# Patient Record
Sex: Female | Born: 1960
Health system: Southern US, Community
[De-identification: ages and names within clinical notes are randomized; demographics above are authoritative.]

## PROBLEM LIST (undated history)

## (undated) ENCOUNTER — Encounter

## (undated) ENCOUNTER — Encounter: Attending: Family | Primary: Family

## (undated) ENCOUNTER — Ambulatory Visit

## (undated) ENCOUNTER — Encounter: Attending: Gastroenterology | Primary: Gastroenterology

## (undated) ENCOUNTER — Telehealth: Attending: Gastroenterology | Primary: Gastroenterology

## (undated) ENCOUNTER — Telehealth
Attending: Pharmacist Clinician (PhC)/ Clinical Pharmacy Specialist | Primary: Pharmacist Clinician (PhC)/ Clinical Pharmacy Specialist

## (undated) ENCOUNTER — Ambulatory Visit: Payer: PRIVATE HEALTH INSURANCE

## (undated) ENCOUNTER — Encounter: Payer: PRIVATE HEALTH INSURANCE | Attending: Gastroenterology | Primary: Gastroenterology

## (undated) ENCOUNTER — Ambulatory Visit: Payer: PRIVATE HEALTH INSURANCE | Attending: Gastroenterology | Primary: Gastroenterology

## (undated) ENCOUNTER — Telehealth

## (undated) ENCOUNTER — Ambulatory Visit: Attending: Pharmacist | Primary: Pharmacist

## (undated) ENCOUNTER — Encounter
Attending: Pharmacist Clinician (PhC)/ Clinical Pharmacy Specialist | Primary: Pharmacist Clinician (PhC)/ Clinical Pharmacy Specialist

## (undated) ENCOUNTER — Non-Acute Institutional Stay: Payer: PRIVATE HEALTH INSURANCE

## (undated) DIAGNOSIS — E785 Hyperlipidemia, unspecified: Secondary | ICD-10-CM

## (undated) DIAGNOSIS — K759 Inflammatory liver disease, unspecified: Secondary | ICD-10-CM

## (undated) DIAGNOSIS — D249 Benign neoplasm of unspecified breast: Secondary | ICD-10-CM

## (undated) DIAGNOSIS — M1990 Unspecified osteoarthritis, unspecified site: Secondary | ICD-10-CM

## (undated) HISTORY — DX: Unspecified osteoarthritis, unspecified site: M19.90

## (undated) HISTORY — PX: ABDOMINAL HYSTERECTOMY: SHX81

## (undated) HISTORY — DX: Hyperlipidemia, unspecified: E78.5

## (undated) HISTORY — DX: Benign neoplasm of unspecified breast: D24.9

---
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
## 1995-07-15 HISTORY — PX: BREAST EXCISIONAL BIOPSY: SUR124

## 1995-07-15 HISTORY — PX: BREAST SURGERY: SHX581

## 2008-01-28 ENCOUNTER — Encounter: Payer: Self-pay | Admitting: Cardiovascular Disease

## 2009-03-05 ENCOUNTER — Ambulatory Visit: Payer: Self-pay | Admitting: Internal Medicine

## 2009-09-11 ENCOUNTER — Encounter: Payer: Self-pay | Admitting: Cardiovascular Disease

## 2009-09-11 LAB — CONVERTED CEMR LAB
ALT: 21 units/L
AST: 23 units/L
Alkaline Phosphatase: 65 units/L
CO2: 24 meq/L
Calcium: 9.7 mg/dL
Cholesterol: 219 mg/dL
GFR calc non Af Amer: >59 mL/min
Glucose, Bld: 95 mg/dL
HDL: 92 mg/dL
Sodium: 141 meq/L
Total Bilirubin: 0.5 mg/dL
Triglyceride fasting, serum: 79 mg/dL

## 2009-09-13 ENCOUNTER — Ambulatory Visit: Payer: Self-pay | Admitting: Cardiovascular Disease

## 2009-09-13 DIAGNOSIS — R079 Chest pain, unspecified: Secondary | ICD-10-CM | POA: Insufficient documentation

## 2009-09-13 DIAGNOSIS — E785 Hyperlipidemia, unspecified: Secondary | ICD-10-CM | POA: Insufficient documentation

## 2009-09-14 ENCOUNTER — Encounter: Payer: Self-pay | Admitting: Cardiovascular Disease

## 2010-05-15 ENCOUNTER — Ambulatory Visit: Payer: Self-pay | Admitting: Gastroenterology

## 2010-08-13 NOTE — Progress Notes (Signed)
Summary: PHI  PHI   Imported By: Harlon Flor 09/14/2009 14:33:12  _____________________________________________________________________  External Attachment:    Type:   Image     Comment:   External Document

## 2010-08-13 NOTE — Assessment & Plan Note (Signed)
Summary: NP6/AMD   Visit Type:  New Patient Referring Provider:  Mariah Milling Primary Provider:  Duncan Dull, MD  CC:  chest tightness, no sob, and no edema.  History of Present Illness: 50 year old woman with history of hyperlipidemia, strong family history of coronary artery disease in her father who had his first MI at age 58, with history of nonexertional chest discomfort. She was last seen by myself at [facility] on July 2009.  Previously in 2009, she had complained of some nonexertional chest discomfort. No workup was done at that time. She presents again with some pressure that comes on typically at night time although sometimes she has this in the daytime, associated with rest. She exercises on a regular basis, running up to 5 miles at a time with no significant symptoms of chest pressure or shortness of breath. Otherwise she is very active and feels well. She's not had any other associated symptoms such as lightheadedness, diaphoresis, near syncope, shortness of breath.  Preventive Screening-Counseling & Management  Alcohol-Tobacco     Alcohol drinks/day: 2     Smoking Status: never  Caffeine-Diet-Exercise     Caffeine use/day: 2 cups a day     Does Patient Exercise: yes  Current Problems (verified): 1)  Chest Pain  (ICD-786.50)  Current Medications (verified): 1)  None  Allergies (verified): No Known Drug Allergies  Past History:  Family History: Last updated: 09/13/2009 Mother: no health problems Father: died at 44: first MI at 36, 2nd at 12: and 2 heart transplants at 50&56: hyperlipidemia. Cousin had AMI at age 74.   Social History: Last updated: 09/13/2009 Full Time Married  Tobacco Use - No.  Alcohol Use - yes  Risk Factors: Alcohol Use: 2 (09/13/2009) Caffeine Use: 2 cups a day (09/13/2009) Exercise: yes (09/13/2009)  Risk Factors: Smoking Status: never (09/13/2009)  Past Medical History: Hyperlipidemia chest  pain  Family History: Reviewed history from 09/13/2009 and no changes required. Mother: no health problems Father: died at 24: first MI at 35, 2nd at 17: and 2 heart transplants at 50&56: hyperlipidemia. Cousin had AMI at age 71.   Social History: Reviewed history from 09/13/2009 and no changes required. Full Time Married  Tobacco Use - No.  Alcohol Use - yes Alcohol drinks/day:  2 Caffeine use/day:  2 cups a day Does Patient Exercise:  yes  Review of Systems       The patient complains of chest pain.  The patient denies fever, vision loss, decreased hearing, hoarseness, syncope, dyspnea on exertion, peripheral edema, prolonged cough, abdominal pain, incontinence, muscle weakness, depression, and enlarged lymph nodes.         Chest pressure  Vital Signs:  Patient profile:   50 year old female Height:      66.5 inches Weight:      148.50 pounds BMI:     23.69 Pulse rate:   72 / minute Pulse rhythm:   regular BP sitting:   108 / 72  (left arm) Cuff size:   regular  Vitals Entered By: Mercer Pod (September 13, 2009 3:17 PM)  Physical Exam  General:  young woman in no apparent distress, HEENT exam is benign, oropharynx is clear, neck is supple with no JVP or carotid bruits, heart sounds are regular with normal S1 and S2 and no murmurs appreciated, lungs are clear to auscultation with no wheezes Rales, abdominal exam is benign, she has no significant lower extremity edema, neurologic exam is grossly nonfocal, skin is  warm and dry.    EKG  Procedure date:  09/13/2009  Findings:      normal sinus rhythm at a rate of 72 beats per minute, no significant ST or T wave changes.  EKG  Procedure date:  09/13/2009  Findings:      normal sinus rhythm with rate of 72 beats per minute, no significant ST or T wave changes.  Impression & Recommendations:  Problem # 1:  CHEST PAIN (ICD-786.50) Chest pressure typically at nighttime, nonexertional. She does have a family  history of coronary artery disease though her symptoms are very atypical. She exercises without any significant symptoms. We did discuss with her that we could do a stress test at [facility] or at [facility] if she would like though this may be low yield given that she is not having anginal type symptoms. She will think about it and contact us if she would like to have further testing done.  Problem # 2:  HYPERLIPIDEMIA-MIXED (ICD-272.4) Cholesterol done in September 11, 2009 is 219, LDL 111, HDL 92. We talked to her at length about her lipids. She does have a strong family history of coronary artery disease. Both of her brothers are on statins. She exercises frequently and is very slim and in good shape and it is unlikely that she will be up to decrease her numbers to any significant degree. We have suggested that she could try a low dose statin such as Crestor 5 mg daily as this would hopefully improve her numbers by up to 30-40%. Dr. Darrick Huntsman or myself can  check her cholesterol in 3 months time.

## 2010-08-13 NOTE — Progress Notes (Signed)
Summary: [facility]   Imported By: Harlon Flor 09/14/2009 14:33:48  _____________________________________________________________________  External Attachment:    Type:   Image     Comment:   External Document

## 2010-09-10 ENCOUNTER — Ambulatory Visit: Payer: Self-pay | Admitting: Internal Medicine

## 2011-01-07 ENCOUNTER — Ambulatory Visit: Payer: Self-pay | Admitting: Unknown Physician Specialty

## 2011-02-04 ENCOUNTER — Encounter: Payer: Self-pay | Admitting: Cardiovascular Disease

## 2011-08-04 ENCOUNTER — Ambulatory Visit (INDEPENDENT_AMBULATORY_CARE_PROVIDER_SITE_OTHER): Payer: 59 | Admitting: Cardiovascular Disease

## 2011-08-04 ENCOUNTER — Encounter: Payer: Self-pay | Admitting: Cardiovascular Disease

## 2011-08-04 DIAGNOSIS — Z0000 Encounter for general adult medical examination without abnormal findings: Secondary | ICD-10-CM

## 2011-08-04 DIAGNOSIS — E785 Hyperlipidemia, unspecified: Secondary | ICD-10-CM

## 2011-08-04 DIAGNOSIS — R079 Chest pain, unspecified: Secondary | ICD-10-CM

## 2011-08-04 MED ORDER — ASPIRIN 81 MG PO CHEW: 81.0000 mg | CHEWABLE_TABLET | Freq: Every day | ORAL | Status: AC

## 2011-08-04 NOTE — Assessment & Plan Note (Signed)
We have asked her to work hard on her weight loss and diet with a recheck of her cholesterol in March. We may need to change her cholesterol medication to something stronger if her lab work does not show significant improvement from 2011.

## 2011-08-04 NOTE — Assessment & Plan Note (Signed)
Vague chest discomfort is likely noncardiac as it comes on at rest with no increase in symptoms with exertion. I've asked her to continue her workup on a daily basis and let us know if her symptoms get worse. If she does have worsening symptoms, we would have her complete a treadmill study.

## 2011-08-04 NOTE — Assessment & Plan Note (Signed)
We have encouraged continued exercise, careful diet management in an effort to lose weight. We have suggested she start aspirin 81 mg daily.

## 2011-08-04 NOTE — Progress Notes (Signed)
   Patient ID: Jessica Zuniga, female    DOB: 04/20/1961, 51 y.o.   MRN: [id number]  HPI Comments: 51 year old woman with history of hyperlipidemia, strong family history of coronary artery disease in her father who had his first MI at age 70, with Previous history of nonexertional chest discomfort who presents for routine followup.   She had chest discomfort previously in 2009. This was felt to be noncardiac. Symptoms went away and she has been doing well.  She reports having weight gain of 20 pounds and [place] and eating out frequently.  She has restarted her workout program 2 weeks ago and is walking for one hour at a time with her husband. She feels well with exertion with no chest tightness.  Occasionally at rest, she will have some chest discomfort that is mild. No other associated symptoms. She is scheduled to see Dr. Darrick Huntsman in March of 2013.   EKG shows normal sinus rhythm with rate 72 beats per minute with no significant ST or T wave changes    Outpatient Encounter Prescriptions as of 08/04/2011  Medication Sig Dispense Refill  . pravastatin (PRAVACHOL) 10 MG tablet Take 10 mg by mouth daily.         Review of Systems  Constitutional: Negative.   HENT: Negative.   Eyes: Negative.   Respiratory: Negative.   Cardiovascular: Negative.        Vague chest tightness at rest  Gastrointestinal: Negative.   Musculoskeletal: Negative.   Skin: Negative.   Neurological: Negative.   Hematological: Negative.   Psychiatric/Behavioral: Negative.   All other systems reviewed and are negative.    BP 144/72  Pulse 72  Ht 5\' 7"  (1.702 m)  Wt 161 lb 12.8 oz (73.392 kg)  BMI 25.34 kg/m2  Physical Exam  Nursing note and vitals reviewed. Constitutional: She is oriented to person, place, and time. She appears well-developed and well-nourished.  HENT:  Head: Normocephalic.  Nose: Nose normal.  Mouth/Throat: Oropharynx is clear and moist.    Eyes: Conjunctivae are normal. Pupils are equal, round, and reactive to light.  Neck: Normal range of motion. Neck supple. No JVD present.  Cardiovascular: Normal rate, regular rhythm, S1 normal, S2 normal, normal heart sounds and intact distal pulses.  Exam reveals no gallop and no friction rub.   No murmur heard. Pulmonary/Chest: Effort normal and breath sounds normal. No respiratory distress. She has no wheezes. She has no rales. She exhibits no tenderness.  Abdominal: Soft. Bowel sounds are normal. She exhibits no distension. There is no tenderness.  Musculoskeletal: Normal range of motion. She exhibits no edema and no tenderness.  Lymphadenopathy:    She has no cervical adenopathy.  Neurological: She is alert and oriented to person, place, and time. Coordination normal.  Skin: Skin is warm and dry. No rash noted. No erythema.  Psychiatric: She has a normal mood and affect. Her behavior is normal. Judgment and thought content normal.         Assessment and Plan

## 2011-08-04 NOTE — Patient Instructions (Signed)
You are doing well. No medication changes were made.  Please call us if you have new issues that need to be addressed before your next appt.  Your physician wants you to follow-up in: 12 months.  You will receive a reminder letter in the mail two months in advance. If you don't receive a letter, please call our office to schedule the follow-up appointment. 

## 2011-09-10 ENCOUNTER — Telehealth: Payer: Self-pay

## 2011-09-10 NOTE — Telephone Encounter (Signed)
She just needed a new refill sent in for the pravastatin.

## 2011-09-10 NOTE — Telephone Encounter (Signed)
Would like to know what Rx you were going to send in for her cholesterol? Please advise.

## 2012-08-23 ENCOUNTER — Telehealth: Payer: Self-pay | Admitting: Internal Medicine

## 2012-08-23 NOTE — Telephone Encounter (Signed)
Yes I cannot call in prescriptions for patients I have not seen yet , even if they are friends.

## 2012-08-23 NOTE — Telephone Encounter (Signed)
Pt got in touch with a Dr. At [facility] and they called her in some Antibiotics for UTI.Marland KitchenPlease confirm received? Disregard message below. I set her up in May to see Dr. Darrick Huntsman

## 2012-08-23 NOTE — Telephone Encounter (Signed)
Can you please schedule pt?  

## 2012-08-23 NOTE — Telephone Encounter (Signed)
Pt says she has visible blood in her urine. She has not established with Dr. Darrick Huntsman here at this office yet but says she is a friend of hers ??? I told her I would send a note back to see what we could do ??? She does use [facility] on [address].

## 2012-12-03 ENCOUNTER — Encounter: Payer: Self-pay | Admitting: Internal Medicine

## 2012-12-03 ENCOUNTER — Other Ambulatory Visit (HOSPITAL_COMMUNITY)
Admission: RE | Admit: 2012-12-03 | Discharge: 2012-12-03 | Disposition: A | Discharge status: Home / Self Care | Payer: 59 | Source: Ambulatory Visit | Attending: Internal Medicine | Admitting: Internal Medicine

## 2012-12-03 ENCOUNTER — Ambulatory Visit (INDEPENDENT_AMBULATORY_CARE_PROVIDER_SITE_OTHER): Payer: 59 | Admitting: Internal Medicine

## 2012-12-03 ENCOUNTER — Ambulatory Visit: Payer: Self-pay | Admitting: Internal Medicine

## 2012-12-03 VITALS — BP 136/78 | HR 77 | Temp 98.3°F | Resp 16 | Ht 66.0 in | Wt 155.8 lb

## 2012-12-03 DIAGNOSIS — Z0000 Encounter for general adult medical examination without abnormal findings: Secondary | ICD-10-CM

## 2012-12-03 DIAGNOSIS — Z124 Encounter for screening for malignant neoplasm of cervix: Secondary | ICD-10-CM

## 2012-12-03 DIAGNOSIS — Z79899 Other long term (current) drug therapy: Secondary | ICD-10-CM

## 2012-12-03 DIAGNOSIS — R920 Mammographic microcalcification found on diagnostic imaging of breast: Secondary | ICD-10-CM

## 2012-12-03 DIAGNOSIS — Z01419 Encounter for gynecological examination (general) (routine) without abnormal findings: Secondary | ICD-10-CM | POA: Insufficient documentation

## 2012-12-03 DIAGNOSIS — R5381 Other malaise: Secondary | ICD-10-CM

## 2012-12-03 DIAGNOSIS — Z1151 Encounter for screening for human papillomavirus (HPV): Secondary | ICD-10-CM | POA: Insufficient documentation

## 2012-12-03 DIAGNOSIS — E785 Hyperlipidemia, unspecified: Secondary | ICD-10-CM

## 2012-12-03 DIAGNOSIS — R5383 Other fatigue: Secondary | ICD-10-CM

## 2012-12-03 NOTE — Assessment & Plan Note (Signed)
She will need additional images of right breast due to presence of microcalcifications on right breast seen on today's screening mammogram done at [facility].

## 2012-12-03 NOTE — Progress Notes (Signed)
Patient ID: SUSA BONES, female   DOB: 09-27-1960, 52 y.o.   MRN: [id number]    Subjective:    Jessica Zuniga is a 52 y.o. female who presents for an annual exam. The patient has no complaints today. The patient is sexually active. GYN screening history: last pap: was normal and approximate date 2011 and was normal. The patient wears seatbelts: yes. The patient participates in regular exercise: yes. Has the patient ever been transfused or tattooed?: no. The patient reports that there is not domestic violence in her life.   Menstrual History: OB History   Grav Para Term Preterm Abortions TAB SAB Ect Mult Living                  Menarche age: 32  No LMP recorded. Patient is postmenopausal.    Past Medical History  Diagnosis Date  . HLD (hyperlipidemia)   . Chest pain    Family History  Problem Relation Age of Onset  . Heart attack Father     heart transplant  . Heart disease Paternal Grandmother   . Heart disease Maternal Grandmother     Current Outpatient Prescriptions on File Prior to Visit  Medication Sig Dispense Refill  . pravastatin (PRAVACHOL) 10 MG tablet Take 10 mg by mouth daily.       No current facility-administered medications on file prior to visit.   No Known Allergies  Review of Systems A comprehensive review of systems was negative.    Objective:    BP 136/78  Pulse 77  Temp(Src) 98.3 F (36.8 C) (Oral)  Resp 16  Ht 5\' 6"  (1.676 m)  Wt 155 lb 12 oz (70.648 kg)  BMI 25.15 kg/m2  SpO2 98%  General Appearance:    Alert, cooperative, no distress, appears stated age  Head:    Normocephalic, without obvious abnormality, atraumatic  Eyes:    PERRL, conjunctiva/corneas clear, EOM's intact, fundi    benign, both eyes  Ears:    Normal TM's and external ear canals, both ears  Nose:   Nares normal, septum midline, mucosa normal, no drainage    or sinus tenderness  Throat:   Lips, mucosa, and tongue normal; teeth and gums normal  Neck:   Supple,  symmetrical, trachea midline, no adenopathy;    thyroid:  no enlargement/tenderness/nodules; no carotid   bruit or JVD  Back:     Symmetric, no curvature, ROM normal, no CVA tenderness  Lungs:     Clear to auscultation bilaterally, respirations unlabored  Chest Wall:    No tenderness or deformity   Heart:    Regular rate and rhythm, S1 and S2 normal, no murmur, rub   or gallop  Breast Exam:    No tenderness, masses, or nipple abnormality  Abdomen:     Soft, non-tender, bowel sounds active all four quadrants,    no masses, no organomegaly  Genitalia:    Pelvic: cervix normal in appearance, external genitalia normal, no adnexal masses or tenderness, no cervical motion tenderness, rectovaginal septum normal, uterus normal size, shape, and consistency and vagina normal without discharge  Extremities:   Extremities normal, atraumatic, no cyanosis or edema  Pulses:   2+ and symmetric all extremities  Skin:   Skin color, texture, turgor normal, no rashes or lesions  Lymph nodes:   Cervical, supraclavicular, and axillary nodes normal  Neurologic:   CNII-XII intact, normal strength, sensation and reflexes    throughout    Assessment:   Encounter for preventive  health examination Annual comprehensive exam was done including breast, pelvic and PAP smear. All screenings have been addressed .   HYPERLIPIDEMIA-MIXED She has not had fasting lipids or CMET done in over a year.   The risks of conttinuinig statin therapy without surveillance of jheaptic function was dicussed.  Labs have been ordered throught [facility] for next week.   Abnormal mammogram with microcalcification She will need additional images of right breast due to presence of microcalcifications on right breast seen on today's screening mammogram done at [facility].    Updated Medication List Outpatient Encounter Prescriptions as of 12/03/2012  Medication Sig Dispense Refill  . pravastatin (PRAVACHOL) 10 MG tablet Take 10 mg by mouth  daily.       No facility-administered encounter medications on file as of 12/03/2012.

## 2012-12-03 NOTE — Assessment & Plan Note (Signed)
Annual comprehensive exam was done including breast, pelvic and PAP smear. All screenings have been addressed .  

## 2012-12-03 NOTE — Assessment & Plan Note (Signed)
She has not had fasting lipids or CMET done in over a year.   The risks of conttinuinig statin therapy without surveillance of jheaptic function was dicussed.  Labs have been ordered throught [facility] for next week.

## 2012-12-10 ENCOUNTER — Encounter: Payer: Self-pay | Admitting: *Deleted

## 2012-12-12 HISTORY — PX: BREAST BIOPSY: SHX20

## 2012-12-14 ENCOUNTER — Ambulatory Visit: Payer: Self-pay | Admitting: Internal Medicine

## 2012-12-15 ENCOUNTER — Telehealth: Payer: Self-pay | Admitting: Internal Medicine

## 2012-12-15 DIAGNOSIS — R920 Mammographic microcalcification found on diagnostic imaging of breast: Secondary | ICD-10-CM

## 2012-12-15 NOTE — Telephone Encounter (Signed)
Her additional mammogram views were reported as "probably normal." and short term gollow up of 6 months is recommended.  I would like to get a second opinion from a surgeon who does breast biopsies. Who would sle like me to call?

## 2012-12-15 NOTE — Telephone Encounter (Signed)
Patient notified as instructed and patient states if you would like second opinion she trustest your judgment and whom ever you recommend.

## 2012-12-15 NOTE — Telephone Encounter (Signed)
I trust Youth worker.  [facility]  . Will make referral

## 2012-12-23 ENCOUNTER — Encounter: Payer: Self-pay | Admitting: General Surgery

## 2012-12-23 ENCOUNTER — Ambulatory Visit (INDEPENDENT_AMBULATORY_CARE_PROVIDER_SITE_OTHER): Payer: 59 | Admitting: General Surgery

## 2012-12-23 VITALS — BP 120/68 | HR 72 | Resp 16 | Ht 66.0 in | Wt 159.0 lb

## 2012-12-23 DIAGNOSIS — R920 Mammographic microcalcification found on diagnostic imaging of breast: Secondary | ICD-10-CM

## 2012-12-23 NOTE — Progress Notes (Signed)
Patient ID: Jessica Zuniga, female   DOB: 1961/04/13, 52 y.o.   MRN: [id number]  Chief Complaint  Patient presents with  . Breast Problem    abnormal mammogram category 3    HPI Jessica Zuniga is a 52 y.o. female Who presents for a breast evaluation. The most recent mammogram was done on 12/03/12 cat 3. Patient does perform regular self breast checks and gets regular mammograms done. No family history of breast cancer. Patient had a right breast papilloma removed in 19-Dec-1995.  HPI  Past Medical History  Diagnosis Date  . HLD (hyperlipidemia)   . Chest pain   . Papilloma of breast     right breast    Past Surgical History  Procedure Laterality Date  . Breast surgery Right 1997    papilloma     Family History  Problem Relation Age of Onset  . Heart attack Father     heart transplant  . Heart disease Paternal Grandmother   . Heart disease Maternal Grandmother     Social History History  Substance Use Topics  . Smoking status: Never Smoker   . Smokeless tobacco: Never Used     Comment: tobacco use - no  . Alcohol Use: Yes    No Known Allergies  Current Outpatient Prescriptions  Medication Sig Dispense Refill  . pravastatin (PRAVACHOL) 10 MG tablet Take 10 mg by mouth daily.       No current facility-administered medications for this visit.    Review of Systems Review of Systems  Constitutional: Negative.   Respiratory: Negative.   Cardiovascular: Negative.     Blood pressure 120/68, pulse 72, resp. rate 16, height 5\' 6"  (1.676 m), weight 159 lb (72.122 kg).  Physical Exam Physical Exam  Constitutional: She appears well-developed and well-nourished.  Cardiovascular: Normal rate, regular rhythm and normal heart sounds.   Pulmonary/Chest: Effort normal and breath sounds normal. Right breast exhibits no inverted nipple, no mass, no nipple discharge, no skin change and no tenderness. Left breast exhibits no inverted nipple, no mass, no nipple discharge, no skin  change and no tenderness. Breasts are asymmetrical (left breast bigger than right).  Lymphadenopathy:    She has no cervical adenopathy.    She has no axillary adenopathy.  Neurological: She is alert.  Skin: Skin is warm and dry.    Data Reviewed Bilateral mammograms dated 12/03/2012 showed a cluster of microcalcifications in the inferior medial right breast without associated mass or architectural distortion. BI-RAD-0.  Focal spot compression views dated 12/14/2012 showed loose groupings of calcifications in the medial breast which were more scattered on the MLO and ML view. Some of the calcifications are unchanged from August 2010. BI-RAD-3.  On my review the films there's been a steady increasing calcifications especially between December 19, 2010 and Dec 18, 2012. Posterior triangular shape with some areas of amorphous, cluster calcifications are notable. Associated with this area or to long-standing macrocalcifications. There is always been increased density in this area dating back to these 18-Dec-2008. This is likely a degenerative changes, more pronounced from December 19, 2010. Assessment    Abnormal mammogram with increasing calcifications in the medial breast. No history of trauma.    Plan    The patient has been 1 who normally has mammograms on an every other year basis. I think a 6 month followup exam is likely add little benefit considering the significant change between Dec 19, 2010 and 2012-12-18. I suspect this is benign tissue, but I would recommend a stereotactic biopsy to confirm this.  The stereotactic procedure was reviewed as well as risks and benefits of the procedure.    Patient has been scheduled for a right breast stereotactic biopsy at [facility] for 12-27-12 at 12 noon. She will check-in at the [facility] at 11:30 am. This patient is aware of date, time, and instructions. Patient verbalizes understanding.    Jessica Zuniga 12/23/2012, 12:16 PM

## 2012-12-23 NOTE — Patient Instructions (Signed)
Patient has been scheduled for a right breast stereotactic biopsy at [facility] for 12-27-12 at 12 noon. She will check-in at the [facility] at 11:30 am. This patient is aware of date, time, and instructions. Patient verbalizes understanding.

## 2012-12-27 ENCOUNTER — Ambulatory Visit: Payer: Self-pay | Admitting: General Surgery

## 2012-12-27 DIAGNOSIS — R920 Mammographic microcalcification found on diagnostic imaging of breast: Secondary | ICD-10-CM

## 2012-12-28 ENCOUNTER — Telehealth: Payer: Self-pay | Admitting: General Surgery

## 2012-12-28 NOTE — Telephone Encounter (Signed)
The patient was notified that the biopsy results were benign. She reports that she is doing well. Will arrange for a follow up bilateral mammogram in six months.

## 2012-12-29 ENCOUNTER — Encounter: Payer: Self-pay | Admitting: General Surgery

## 2012-12-30 ENCOUNTER — Ambulatory Visit: Payer: 59

## 2013-01-25 ENCOUNTER — Encounter: Payer: Self-pay | Admitting: Internal Medicine

## 2013-01-26 ENCOUNTER — Encounter: Payer: Self-pay | Admitting: Internal Medicine

## 2013-06-29 ENCOUNTER — Encounter: Payer: Self-pay | Admitting: General Surgery

## 2013-06-29 ENCOUNTER — Ambulatory Visit: Payer: Self-pay | Admitting: General Surgery

## 2013-07-14 HISTORY — PX: KNEE ARTHROSCOPY WITH ANTERIOR CRUCIATE LIGAMENT (ACL) REPAIR: SHX5644

## 2013-07-19 ENCOUNTER — Ambulatory Visit: Payer: 59 | Admitting: General Surgery

## 2013-08-17 ENCOUNTER — Encounter: Payer: Self-pay | Admitting: *Deleted

## 2014-05-15 ENCOUNTER — Encounter: Payer: Self-pay | Admitting: General Surgery

## 2014-07-14 ENCOUNTER — Other Ambulatory Visit: Payer: Self-pay | Admitting: Internal Medicine

## 2014-07-14 DIAGNOSIS — Z1159 Encounter for screening for other viral diseases: Secondary | ICD-10-CM

## 2014-07-14 DIAGNOSIS — E785 Hyperlipidemia, unspecified: Secondary | ICD-10-CM

## 2014-07-14 DIAGNOSIS — IMO0002 Reserved for concepts with insufficient information to code with codable children: Secondary | ICD-10-CM

## 2014-07-14 DIAGNOSIS — R5383 Other fatigue: Secondary | ICD-10-CM

## 2015-03-30 ENCOUNTER — Telehealth: Payer: Self-pay | Admitting: Internal Medicine

## 2015-03-30 DIAGNOSIS — I10 Essential (primary) hypertension: Secondary | ICD-10-CM

## 2015-03-30 DIAGNOSIS — E785 Hyperlipidemia, unspecified: Secondary | ICD-10-CM

## 2015-03-30 DIAGNOSIS — Z1239 Encounter for other screening for malignant neoplasm of breast: Secondary | ICD-10-CM

## 2015-03-30 DIAGNOSIS — R5383 Other fatigue: Secondary | ICD-10-CM

## 2015-03-30 DIAGNOSIS — Z113 Encounter for screening for infections with a predominantly sexual mode of transmission: Secondary | ICD-10-CM

## 2015-03-30 NOTE — Telephone Encounter (Signed)
ordered

## 2015-03-30 NOTE — Telephone Encounter (Signed)
Pt would like to get fasting lab work and mammo referral. Need lab orders. Also pt just making sure that It's ok. Pt would like her husband to be a pt. Thank You!

## 2015-04-02 ENCOUNTER — Other Ambulatory Visit: Payer: Self-pay | Admitting: Internal Medicine

## 2015-04-02 ENCOUNTER — Ambulatory Visit (INDEPENDENT_AMBULATORY_CARE_PROVIDER_SITE_OTHER): Payer: 59 | Admitting: Internal Medicine

## 2015-04-02 ENCOUNTER — Ambulatory Visit
Admission: RE | Admit: 2015-04-02 | Discharge: 2015-04-02 | Disposition: A | Discharge status: Home / Self Care | Payer: 59 | Source: Ambulatory Visit | Attending: Internal Medicine | Admitting: Internal Medicine

## 2015-04-02 ENCOUNTER — Encounter: Payer: Self-pay | Admitting: Internal Medicine

## 2015-04-02 ENCOUNTER — Encounter (INDEPENDENT_AMBULATORY_CARE_PROVIDER_SITE_OTHER): Payer: Self-pay

## 2015-04-02 VITALS — BP 150/90 | HR 71 | Temp 98.6°F | Resp 14 | Ht 66.5 in | Wt 162.2 lb

## 2015-04-02 DIAGNOSIS — Z113 Encounter for screening for infections with a predominantly sexual mode of transmission: Secondary | ICD-10-CM | POA: Diagnosis not present

## 2015-04-02 DIAGNOSIS — Z0000 Encounter for general adult medical examination without abnormal findings: Secondary | ICD-10-CM

## 2015-04-02 DIAGNOSIS — E785 Hyperlipidemia, unspecified: Secondary | ICD-10-CM

## 2015-04-02 DIAGNOSIS — Z1239 Encounter for other screening for malignant neoplasm of breast: Secondary | ICD-10-CM

## 2015-04-02 DIAGNOSIS — R920 Mammographic microcalcification found on diagnostic imaging of breast: Secondary | ICD-10-CM

## 2015-04-02 DIAGNOSIS — Z1231 Encounter for screening mammogram for malignant neoplasm of breast: Secondary | ICD-10-CM | POA: Diagnosis not present

## 2015-04-02 MED ORDER — ZOLPIDEM TARTRATE 5 MG PO TABS: 5.0000 mg | ORAL_TABLET | Freq: Every evening | ORAL | Status: DC | PRN

## 2015-04-02 MED ORDER — LEVOFLOXACIN 500 MG PO TABS: 500.0000 mg | ORAL_TABLET | Freq: Every day | ORAL | Status: DC

## 2015-04-02 NOTE — Progress Notes (Signed)
Patient ID: Jessica Zuniga, female    DOB: 12-07-1960  Age: 54 y.o. MRN: [id number]  The patient is here for annual wellness examination and management of other chronic and acute problems.  Has lost 8 lbs intentionally since her last visit.   Had a Skiing accident last year  IN [place]  Had 9 monthsf rehab and 2 surgeries  at [facility]  By orthopedist  Dr Donna Christen.  Pain free now .  Has already skied again,  In April     The risk factors are reflected in the social history.  The roster of all physicians providing medical care to patient - is listed in the Snapshot section of the chart.  Activities of daily living:  The patient is 100% independent in all ADLs: dressing, toileting, feeding as well as independent mobility  Home safety : The patient has smoke detectors in the home. They wear seatbelts.  There are no firearms at home. There is no violence in the home.   There is no risks for hepatitis, STDs or HIV. There is no   history of blood transfusion. They have no travel history to infectious disease endemic areas of the world.  The patient has seen their dentist in the last six month. They have seen their eye doctor in the last year. They admit to slight hearing difficulty with regard to whispered voices and some television programs.  They have deferred audiologic testing in the last year.  They do not  have excessive sun exposure. Discussed the need for sun protection: hats, long sleeves and use of sunscreen if there is significant sun exposure.   Diet: the importance of a healthy diet is discussed. They do have a healthy diet.  The benefits of regular aerobic exercise were discussed. She walks 4 times per week ,  20 minutes.   Depression screen: there are no signs or vegative symptoms of depression- irritability, change in appetite, anhedonia, sadness/tearfullness.   The following portions of the patient's history were reviewed and updated as appropriate: allergies, current medications, past  family history, past medical history,  past surgical history, past social history  and problem list.  Visual acuity was not assessed per patient preference since she has regular follow up with her ophthalmologist. Hearing and body mass index were assessed and reviewed.   During the course of the visit the patient was educated and counseled about appropriate screening and preventive services including : fall prevention , diabetes screening, nutrition counseling, colorectal cancer screening, and recommended immunizations.    CC: The primary encounter diagnosis was Breast cancer screening. Diagnoses of Screen for STD (sexually transmitted disease), Encounter for preventive health examination, Hyperlipidemia LDL goal <130, and Abnormal mammogram with microcalcification were also pertinent to this visit.  History Kaydon has a past medical history of HLD (hyperlipidemia); Chest pain; and Papilloma of breast.   She has past surgical history that includes Breast surgery (Right, 1997); Breast excisional biopsy (Right, 1997); and Breast biopsy (Right, 12/2012).   Her family history includes Heart attack in her father; Heart disease in her maternal grandmother and paternal grandmother.She reports that she has never smoked. She has never used smokeless tobacco. She reports that she drinks alcohol. She reports that she does not use illicit drugs.  Outpatient Prescriptions Prior to Visit  Medication Sig Dispense Refill  . pravastatin (PRAVACHOL) 10 MG tablet Take 10 mg by mouth daily.     No facility-administered medications prior to visit.    Review of Systems  Patient denies headache, fevers, malaise, unintentional weight loss, skin rash, eye pain, sinus congestion and sinus pain, sore throat, dysphagia,  hemoptysis , cough, dyspnea, wheezing, chest pain, palpitations, orthopnea, edema, abdominal pain, nausea, melena, diarrhea, constipation, flank pain, dysuria, hematuria, urinary  Frequency, nocturia,  numbness, tingling, seizures,  Focal weakness, Loss of consciousness,  Tremor, insomnia, depression, anxiety, and suicidal ideation.     Objective:  BP 150/90 mmHg  Pulse 71  Temp(Src) 98.6 F (37 C) (Oral)  Resp 14  Ht 5' 6.5" (1.689 m)  Wt 162 lb 4 oz (73.596 kg)  BMI 25.80 kg/m2  SpO2 99%  Physical Exam   General appearance: alert, cooperative and appears stated age Head: Normocephalic, without obvious abnormality, atraumatic Eyes: conjunctivae/corneas clear. PERRL, EOM's intact. Fundi benign. Ears: normal TM's and external ear canals both ears Nose: Nares normal. Septum midline. Mucosa normal. No drainage or sinus tenderness. Throat: lips, mucosa, and tongue normal; teeth and gums normal Neck: no adenopathy, no carotid bruit, no JVD, supple, symmetrical, trachea midline and thyroid not enlarged, symmetric, no tenderness/mass/nodules Lungs: clear to auscultation bilaterally Breasts: normal appearance, no masses or tenderness Heart: regular rate and rhythm, S1, S2 normal, no murmur, click, rub or gallop Abdomen: soft, non-tender; bowel sounds normal; no masses,  no organomegaly Extremities: extremities normal, atraumatic, no cyanosis or edema Pulses: 2+ and symmetric Skin: Skin color, texture, turgor normal. No rashes or lesions Neurologic: Alert and oriented X 3, normal strength and tone. Normal symmetric reflexes. Normal coordination and gait.     Assessment & Plan:   Problem List Items Addressed This Visit      Unprioritized   Hyperlipidemia LDL goal <130    She is overdue for follow up labs.   Lab Results  Component Value Date   CHOL 219 09/11/2009   HDL 92 09/11/2009   LDLCALC 111 09/11/2009   TRIG 79 09/11/2009         Encounter for preventive health examination    Annual wellness  exam was done as well as a comprehensive physical exam  .  During the course of the visit the patient was educated and counseled about appropriate screening and preventive  services and screenings were brought up to date for cervical and breast cancer .  She will return for fasting labs to provide samples for diabetes screening and lipid analysis with projected  10 year  risk for CAD. nutrition counseling, skin cancer screening has been recommended, along with review of the age appropriate recommended immunizations.  Printed recommendations for health maintenance screenings was given.        Abnormal mammogram with microcalcification    She underwent biopsy in 2014 for benign lesion.  Repeat mammogram is normal 2016      Screen for STD (sexually transmitted disease)    Other Visit Diagnoses    Breast cancer screening    -  Primary       I have discontinued Ms. Petrucci's pravastatin. I am also having her start on zolpidem and levofloxacin.  Meds ordered this encounter  Medications  . zolpidem (AMBIEN) 5 MG tablet    Sig: Take 1 tablet (5 mg total) by mouth at bedtime as needed for sleep.    Dispense:  15 tablet    Refill:  1  . levofloxacin (LEVAQUIN) 500 MG tablet    Sig: Take 1 tablet (500 mg total) by mouth daily.    Dispense:  7 tablet    Refill:  0    Medications  Discontinued During This Encounter  Medication Reason  . pravastatin (PRAVACHOL) 10 MG tablet     Follow-up: Return for husband Leroy Sea can be set up as new patient .   Crecencio Mc, MD

## 2015-04-02 NOTE — Progress Notes (Signed)
Pre-visit discussion using our clinic review tool. No additional management support is needed unless otherwise documented below in the visit note.  

## 2015-04-02 NOTE — Patient Instructions (Addendum)
The  diet I discussed with you today is the 10 day Green Smoothie Cleansing /Detox Diet by Linden Dolin . The book is available on [place] and at [employer].  This is not a low carb or a weight loss diet,  It is fundamentally a "cleansing" low fat diet that eliminates sugar, gluten, caffeine, alcohol and dairy for 10 days .  What you add back after the initial ten days is entirely up to  you!  However, You can expect to lose 5 to 10 lbs depending on how strict you are.   I suggest drinking 2 smoothies/ daily and keeping one chewable meal (but keep it simple, like baked fish  With salad, rice or bok choy) .  You snack primarily on fresh  fruit, egg whites and judicious quantities of nuts. You can add vegetable based protein powder (nothing with whey , since whey is dairy) in it.  [employer] has a great selection, so does the [employer],  .   It does require some form of a nutrient extractor or juicing machine  (Vita Mix, a electric juicer,  Or a Nutribullet Rx).  i have found that using frozen fruits is much more convenient and cost effective. You can even find plenty of organic fruit in the frozen fruit section of [employer].  Just thaw what you need for the following day the night before in the refrigerator (to avoid jamming up your machine)   Health Maintenance Adopting a healthy lifestyle and getting preventive care can go a long way to promote health and wellness. Talk with your health care provider about what schedule of regular examinations is right for you. This is a good chance for you to check in with your provider about disease prevention and staying healthy. In between checkups, there are plenty of things you can do on your own. Experts have done a lot of research about which lifestyle changes and preventive measures are most likely to keep you healthy. Ask your health care provider for more information. WEIGHT AND DIET  Eat a healthy diet  Be sure to include plenty of vegetables, fruits, low-fat dairy  products, and lean protein.  Do not eat a lot of foods high in solid fats, added sugars, or salt.  Get regular exercise. This is one of the most important things you can do for your health.  Most adults should exercise for at least 150 minutes each week. The exercise should increase your heart rate and make you sweat (moderate-intensity exercise).  Most adults should also do strengthening exercises at least twice a week. This is in addition to the moderate-intensity exercise.  Maintain a healthy weight  Body mass index (BMI) is a measurement that can be used to identify possible weight problems. It estimates body fat based on height and weight. Your health care provider can help determine your BMI and help you achieve or maintain a healthy weight.  For females 58 years of age and older:   A BMI below 18.5 is considered underweight.  A BMI of 18.5 to 24.9 is normal.  A BMI of 25 to 29.9 is considered overweight.  A BMI of 30 and above is considered obese.  Watch levels of cholesterol and blood lipids  You should start having your blood tested for lipids and cholesterol at 56 years of age, then have this test every 5 years.  You may need to have your cholesterol levels checked more often if:  Your lipid or cholesterol levels are high.  You are older than 56 years of age.  You are at high risk for heart disease.  CANCER SCREENING   Lung Cancer  Lung cancer screening is recommended for adults 4-37 years old who are at high risk for lung cancer because of a history of smoking.  A yearly low-dose CT scan of the lungs is recommended for people who:  Currently smoke.  Have quit within the past 15 years.  Have at least a 30-pack-year history of smoking. A pack year is smoking an average of one pack of cigarettes a day for 1 year.  Yearly screening should continue until it has been 15 years since you quit.  Yearly screening should stop if you develop a health problem that  would prevent you from having lung cancer treatment.  Breast Cancer  Practice breast self-awareness. This means understanding how your breasts normally appear and feel.  It also means doing regular breast self-exams. Let your health care provider know about any changes, no matter how small.  If you are in your 20s or 30s, you should have a clinical breast exam (CBE) by a health care provider every 1-3 years as part of a regular health exam.  If you are 63 or older, have a CBE every year. Also consider having a breast X-ray (mammogram) every year.  If you have a family history of breast cancer, talk to your health care provider about genetic screening.  If you are at high risk for breast cancer, talk to your health care provider about having an MRI and a mammogram every year.  Breast cancer gene (BRCA) assessment is recommended for women who have family members with BRCA-related cancers. BRCA-related cancers include:  Breast.  Ovarian.  Tubal.  Peritoneal cancers.  Results of the assessment will determine the need for genetic counseling and BRCA1 and BRCA2 testing. Cervical Cancer Routine pelvic examinations to screen for cervical cancer are no longer recommended for nonpregnant women who are considered low risk for cancer of the pelvic organs (ovaries, uterus, and vagina) and who do not have symptoms. A pelvic examination may be necessary if you have symptoms including those associated with pelvic infections. Ask your health care provider if a screening pelvic exam is right for you.   The Pap test is the screening test for cervical cancer for women who are considered at risk.  If you had a hysterectomy for a problem that was not cancer or a condition that could lead to cancer, then you no longer need Pap tests.  If you are older than 65 years, and you have had normal Pap tests for the past 10 years, you no longer need to have Pap tests.  If you have had past treatment for cervical  cancer or a condition that could lead to cancer, you need Pap tests and screening for cancer for at least 20 years after your treatment.  If you no longer get a Pap test, assess your risk factors if they change (such as having a new sexual partner). This can affect whether you should start being screened again.  Some women have medical problems that increase their chance of getting cervical cancer. If this is the case for you, your health care provider may recommend more frequent screening and Pap tests.  The human papillomavirus (HPV) test is another test that may be used for cervical cancer screening. The HPV test looks for the virus that can cause cell changes in the cervix. The cells collected during the Pap test can  be tested for HPV.  The HPV test can be used to screen women 46 years of age and older. Getting tested for HPV can extend the interval between normal Pap tests from three to five years.  An HPV test also should be used to screen women of any age who have unclear Pap test results.  After 56 years of age, women should have HPV testing as often as Pap tests.  Colorectal Cancer  This type of cancer can be detected and often prevented.  Routine colorectal cancer screening usually begins at 56 years of age and continues through 56 years of age.  Your health care provider may recommend screening at an earlier age if you have risk factors for colon cancer.  Your health care provider may also recommend using home test kits to check for hidden blood in the stool.  A small camera at the end of a tube can be used to examine your colon directly (sigmoidoscopy or colonoscopy). This is done to check for the earliest forms of colorectal cancer.  Routine screening usually begins at age 59.  Direct examination of the colon should be repeated every 5-10 years through 56 years of age. However, you may need to be screened more often if early forms of precancerous polyps or small growths are  found. Skin Cancer  Check your skin from head to toe regularly.  Tell your health care provider about any new moles or changes in moles, especially if there is a change in a mole's shape or color.  Also tell your health care provider if you have a mole that is larger than the size of a pencil eraser.  Always use sunscreen. Apply sunscreen liberally and repeatedly throughout the day.  Protect yourself by wearing long sleeves, pants, a wide-brimmed hat, and sunglasses whenever you are outside. HEART DISEASE, DIABETES, AND HIGH BLOOD PRESSURE   Have your blood pressure checked at least every 1-2 years. High blood pressure causes heart disease and increases the risk of stroke.  If you are between 36 years and 49 years old, ask your health care provider if you should take aspirin to prevent strokes.  Have regular diabetes screenings. This involves taking a blood sample to check your fasting blood sugar level.  If you are at a normal weight and have a low risk for diabetes, have this test once every three years after 56 years of age.  If you are overweight and have a high risk for diabetes, consider being tested at a younger age or more often. PREVENTING INFECTION  Hepatitis B  If you have a higher risk for hepatitis B, you should be screened for this virus. You are considered at high risk for hepatitis B if:  You were born in a country where hepatitis B is common. Ask your health care provider which countries are considered high risk.  Your parents were born in a high-risk country, and you have not been immunized against hepatitis B (hepatitis B vaccine).  You have HIV or AIDS.  You use needles to inject street drugs.  You live with someone who has hepatitis B.  You have had sex with someone who has hepatitis B.  You get hemodialysis treatment.  You take certain medicines for conditions, including cancer, organ transplantation, and autoimmune conditions. Hepatitis C  Blood  testing is recommended for:  Everyone born from [birth year] through 1967.  Anyone with known risk factors for hepatitis C. Sexually transmitted infections (STIs)  You should be screened for sexually  transmitted infections (STIs) including gonorrhea and chlamydia if:  You are sexually active and are younger than 56 years of age.  You are older than 57 years of age and your health care provider tells you that you are at risk for this type of infection.  Your sexual activity has changed since you were last screened and you are at an increased risk for chlamydia or gonorrhea. Ask your health care provider if you are at risk.  If you do not have HIV, but are at risk, it may be recommended that you take a prescription medicine daily to prevent HIV infection. This is called pre-exposure prophylaxis (PrEP). You are considered at risk if:  You are sexually active and do not regularly use condoms or know the HIV status of your partner(s).  You take drugs by injection.  You are sexually active with a partner who has HIV. Talk with your health care provider about whether you are at high risk of being infected with HIV. If you choose to begin PrEP, you should first be tested for HIV. You should then be tested every 3 months for as long as you are taking PrEP.  PREGNANCY   If you are premenopausal and you may become pregnant, ask your health care provider about preconception counseling.  If you may become pregnant, take 400 to 800 micrograms (mcg) of folic acid every day.  If you want to prevent pregnancy, talk to your health care provider about birth control (contraception). OSTEOPOROSIS AND MENOPAUSE   Osteoporosis is a disease in which the bones lose minerals and strength with aging. This can result in serious bone fractures. Your risk for osteoporosis can be identified using a bone density scan.  If you are 74 years of age or older, or if you are at risk for osteoporosis and fractures, ask your  health care provider if you should be screened.  Ask your health care provider whether you should take a calcium or vitamin D supplement to lower your risk for osteoporosis.  Menopause may have certain physical symptoms and risks.  Hormone replacement therapy may reduce some of these symptoms and risks. Talk to your health care provider about whether hormone replacement therapy is right for you.  HOME CARE INSTRUCTIONS   Schedule regular health, dental, and eye exams.  Stay current with your immunizations.   Do not use any tobacco products including cigarettes, chewing tobacco, or electronic cigarettes.  If you are pregnant, do not drink alcohol.  If you are breastfeeding, limit how much and how often you drink alcohol.  Limit alcohol intake to no more than 1 drink per day for nonpregnant women. One drink equals 12 ounces of beer, 5 ounces of wine, or 1 ounces of hard liquor.  Do not use street drugs.  Do not share needles.  Ask your health care provider for help if you need support or information about quitting drugs.  Tell your health care provider if you often feel depressed.  Tell your health care provider if you have ever been abused or do not feel safe at home. Document Released: 01/13/2011 Document Revised: 11/14/2013 Document Reviewed: 06/01/2013 [facility] Patient Information 2015 [place], [place]. This information is not intended to replace advice given to you by your health care provider. Make sure you discuss any questions you have with your health care provider.

## 2015-04-04 ENCOUNTER — Other Ambulatory Visit: Payer: Self-pay | Admitting: Internal Medicine

## 2015-04-04 DIAGNOSIS — R7401 Elevation of levels of liver transaminase levels: Secondary | ICD-10-CM

## 2015-04-04 DIAGNOSIS — E559 Vitamin D deficiency, unspecified: Secondary | ICD-10-CM

## 2015-04-04 DIAGNOSIS — R740 Nonspecific elevation of levels of transaminase and lactic acid dehydrogenase [LDH]: Secondary | ICD-10-CM

## 2015-04-04 NOTE — Assessment & Plan Note (Signed)
She underwent biopsy in 2014 for benign lesion.  Repeat mammogram is normal 2016

## 2015-04-04 NOTE — Assessment & Plan Note (Addendum)
She is overdue for follow up labs.   Lab Results  Component Value Date   CHOL 219 09/11/2009   HDL 92 09/11/2009   LDLCALC 111 09/11/2009   TRIG 79 09/11/2009

## 2015-04-04 NOTE — Assessment & Plan Note (Signed)

## 2015-04-05 LAB — COMPREHENSIVE METABOLIC PANEL
A/G RATIO: 1.8 (ref 1.1–2.5)
ALBUMIN: 4.6 g/dL (ref 3.5–5.5)
ALK PHOS: 62 IU/L (ref 39–117)
ALT: 43 IU/L — ABNORMAL HIGH (ref 0–32)
AST: 33 IU/L (ref 0–40)
BILIRUBIN TOTAL: 0.5 mg/dL (ref 0.0–1.2)
BUN / CREAT RATIO: 16 (ref 9–23)
BUN: 13 mg/dL (ref 6–24)
CHLORIDE: 98 mmol/L (ref 97–108)
CO2: 23 mmol/L (ref 18–29)
Calcium: 9.9 mg/dL (ref 8.7–10.2)
Creatinine, Ser: 0.79 mg/dL (ref 0.57–1.00)
GFR calc Af Amer: 97 mL/min/{1.73_m2} (ref >59)
GFR calc non Af Amer: 84 mL/min/{1.73_m2} (ref >59)
GLUCOSE: 102 mg/dL — AB (ref 65–99)
Globulin, Total: 2.6 g/dL (ref 1.5–4.5)
POTASSIUM: 4.3 mmol/L (ref 3.5–5.2)
Sodium: 139 mmol/L (ref 134–144)
Total Protein: 7.2 g/dL (ref 6.0–8.5)

## 2015-04-05 LAB — CBC WITH DIFFERENTIAL/PLATELET
BASOS ABS: 0 10*3/uL (ref 0.0–0.2)
Basos: 0 %
EOS (ABSOLUTE): 0.2 10*3/uL (ref 0.0–0.4)
Eos: 4 %
HEMOGLOBIN: 13.5 g/dL (ref 11.1–15.9)
Hematocrit: 39.2 % (ref 34.0–46.6)
Immature Grans (Abs): 0 10*3/uL (ref 0.0–0.1)
Immature Granulocytes: 0 %
LYMPHS ABS: 1.9 10*3/uL (ref 0.7–3.1)
Lymphs: 38 %
MCH: 32.3 pg (ref 26.6–33.0)
MCHC: 34.4 g/dL (ref 31.5–35.7)
MCV: 94 fL (ref 79–97)
Monocytes Absolute: 0.3 10*3/uL (ref 0.1–0.9)
Monocytes: 7 %
NEUTROS ABS: 2.6 10*3/uL (ref 1.4–7.0)
Neutrophils: 51 %
PLATELETS: 220 10*3/uL (ref 150–379)
RBC: 4.18 x10E6/uL (ref 3.77–5.28)
RDW: 12.9 % (ref 12.3–15.4)
WBC: 5 10*3/uL (ref 3.4–10.8)

## 2015-04-05 LAB — TSH: TSH: 1.47 u[IU]/mL (ref 0.450–4.500)

## 2015-04-05 LAB — LIPID PANEL
CHOLESTEROL TOTAL: 250 mg/dL — AB (ref 100–199)
Chol/HDL Ratio: 2.7 ratio units (ref 0.0–4.4)
HDL: 92 mg/dL (ref >39)
LDL Calculated: 137 mg/dL — ABNORMAL HIGH (ref 0–99)
Triglycerides: 103 mg/dL (ref 0–149)
VLDL CHOLESTEROL CAL: 21 mg/dL (ref 5–40)

## 2015-04-05 LAB — HIV ANTIBODY (ROUTINE TESTING W REFLEX): HIV Screen 4th Generation wRfx: NONREACTIVE

## 2015-04-05 LAB — HCV COMMENT:

## 2015-04-05 LAB — VITAMIN D 25 HYDROXY (VIT D DEFICIENCY, FRACTURES): VIT D 25 HYDROXY: 24.2 ng/mL — AB (ref 30.0–100.0)

## 2015-04-05 LAB — HEPATITIS C ANTIBODY (REFLEX)

## 2015-04-08 DIAGNOSIS — R7401 Elevation of levels of liver transaminase levels: Secondary | ICD-10-CM | POA: Insufficient documentation

## 2015-04-08 DIAGNOSIS — R740 Nonspecific elevation of levels of transaminase and lactic acid dehydrogenase [LDH]: Secondary | ICD-10-CM

## 2015-04-08 DIAGNOSIS — E559 Vitamin D deficiency, unspecified: Secondary | ICD-10-CM | POA: Insufficient documentation

## 2015-04-08 MED ORDER — ERGOCALCIFEROL 1.25 MG (50000 UT) PO CAPS: 50000.0000 [IU] | ORAL_CAPSULE | ORAL | Status: DC

## 2015-04-08 NOTE — Addendum Note (Signed)
Addended by: Crecencio Mc on: 04/08/2015 02:58 PM   Modules accepted: Orders

## 2015-04-09 ENCOUNTER — Telehealth: Payer: Self-pay | Admitting: Internal Medicine

## 2015-04-09 ENCOUNTER — Encounter: Payer: Self-pay | Admitting: *Deleted

## 2015-04-09 NOTE — Telephone Encounter (Signed)
PATIENT NOTIFIED.

## 2015-04-09 NOTE — Telephone Encounter (Signed)
Mammogram ok to tell patient normal in system.

## 2015-04-09 NOTE — Telephone Encounter (Signed)
Yes ok to tell her

## 2015-05-04 ENCOUNTER — Other Ambulatory Visit: Payer: Self-pay | Admitting: Internal Medicine

## 2016-02-20 DIAGNOSIS — H5213 Myopia, bilateral: Secondary | ICD-10-CM | POA: Diagnosis not present

## 2016-05-21 ENCOUNTER — Other Ambulatory Visit: Payer: Self-pay | Admitting: Internal Medicine

## 2016-05-21 DIAGNOSIS — Z1231 Encounter for screening mammogram for malignant neoplasm of breast: Secondary | ICD-10-CM

## 2016-06-18 ENCOUNTER — Ambulatory Visit
Admission: RE | Admit: 2016-06-18 | Discharge: 2016-06-18 | Disposition: A | Discharge status: Home / Self Care | Payer: BLUE CROSS/BLUE SHIELD | Source: Ambulatory Visit | Attending: Internal Medicine | Admitting: Internal Medicine

## 2016-06-18 DIAGNOSIS — Z1231 Encounter for screening mammogram for malignant neoplasm of breast: Secondary | ICD-10-CM

## 2016-07-02 ENCOUNTER — Other Ambulatory Visit: Payer: Self-pay | Admitting: Family

## 2016-07-02 ENCOUNTER — Ambulatory Visit (INDEPENDENT_AMBULATORY_CARE_PROVIDER_SITE_OTHER): Payer: BLUE CROSS/BLUE SHIELD

## 2016-07-02 ENCOUNTER — Telehealth: Payer: Self-pay | Admitting: Family

## 2016-07-02 ENCOUNTER — Ambulatory Visit (INDEPENDENT_AMBULATORY_CARE_PROVIDER_SITE_OTHER): Payer: BLUE CROSS/BLUE SHIELD | Admitting: Family

## 2016-07-02 ENCOUNTER — Encounter: Payer: Self-pay | Admitting: Family

## 2016-07-02 VITALS — BP 136/70 | HR 81 | Temp 98.0°F | Ht 66.5 in | Wt 169.6 lb

## 2016-07-02 DIAGNOSIS — R6883 Chills (without fever): Secondary | ICD-10-CM

## 2016-07-02 DIAGNOSIS — R748 Abnormal levels of other serum enzymes: Secondary | ICD-10-CM

## 2016-07-02 DIAGNOSIS — R8290 Unspecified abnormal findings in urine: Secondary | ICD-10-CM

## 2016-07-02 DIAGNOSIS — R509 Fever, unspecified: Secondary | ICD-10-CM | POA: Diagnosis not present

## 2016-07-02 LAB — COMPREHENSIVE METABOLIC PANEL
ALK PHOS: 68 U/L (ref 39–117)
ALT: 768 U/L — AB (ref 0–35)
AST: 532 U/L — ABNORMAL HIGH (ref 0–37)
Albumin: 4.2 g/dL (ref 3.5–5.2)
BILIRUBIN TOTAL: 0.7 mg/dL (ref 0.2–1.2)
BUN: 15 mg/dL (ref 6–23)
CO2: 27 mEq/L (ref 19–32)
CREATININE: 0.74 mg/dL (ref 0.40–1.20)
Calcium: 9.3 mg/dL (ref 8.4–10.5)
Chloride: 104 mEq/L (ref 96–112)
GFR: 85.8 mL/min (ref >60.00)
GLUCOSE: 110 mg/dL — AB (ref 70–99)
Potassium: 4.5 mEq/L (ref 3.5–5.1)
Sodium: 140 mEq/L (ref 135–145)
TOTAL PROTEIN: 6.9 g/dL (ref 6.0–8.3)

## 2016-07-02 LAB — POCT INFLUENZA A/B
INFLUENZA B, POC: NEGATIVE
Influenza A, POC: NEGATIVE

## 2016-07-02 LAB — CBC
HCT: 40.9 % (ref 36.0–46.0)
Hemoglobin: 13.9 g/dL (ref 12.0–15.0)
MCHC: 34 g/dL (ref 30.0–36.0)
MCV: 95.1 fl (ref 78.0–100.0)
Platelets: 161 10*3/uL (ref 150.0–400.0)
RBC: 4.3 Mil/uL (ref 3.87–5.11)
RDW: 13.4 % (ref 11.5–15.5)
WBC: 5.2 10*3/uL (ref 4.0–10.5)

## 2016-07-02 LAB — POCT URINALYSIS DIPSTICK
Glucose, UA: NEGATIVE
KETONES UA: NEGATIVE
Leukocytes, UA: NEGATIVE
Nitrite, UA: NEGATIVE
PH UA: 5
Protein, UA: NEGATIVE
RBC UA: NEGATIVE
Spec Grav, UA: >=1.03
Urobilinogen, UA: 0.2

## 2016-07-02 LAB — TSH: TSH: 1.18 u[IU]/mL (ref 0.35–4.50)

## 2016-07-02 NOTE — Addendum Note (Signed)
Addended by: Leeanne Rio on: 07/02/2016 11:49 AM   Modules accepted: Orders

## 2016-07-02 NOTE — Progress Notes (Unsigned)
   Subjective:    Patient ID: Jessica Zuniga, female    DOB: 21-Apr-1961, 55 y.o.   MRN: [id number]  CC: Jessica Zuniga is a 55 y.o. female who presents today for an acute visit.    HPI: HPI  HISTORY:  Past Medical History:  Diagnosis Date  . Chest pain   . HLD (hyperlipidemia)   . Papilloma of breast    right breast   Past Surgical History:  Procedure Laterality Date  . BREAST BIOPSY Right 12/2012   neg stereo  . BREAST EXCISIONAL BIOPSY Right 1997   neg  . BREAST SURGERY Right 1997   papilloma    Family History  Problem Relation Age of Onset  . Heart attack Father     heart transplant  . Heart disease Paternal Grandmother   . Heart disease Maternal Grandmother   . Breast cancer Neg Hx     Allergies: Patient has no known allergies. No current outpatient prescriptions on file prior to visit.   No current facility-administered medications on file prior to visit.     Social History  Substance Use Topics  . Smoking status: Never Smoker  . Smokeless tobacco: Never Used     Comment: tobacco use - no  . Alcohol use Yes    Review of Systems    Objective:    There were no vitals taken for this visit.   Physical Exam     Assessment & Plan:      Ms. Deras does not currently have medications on file.   No orders of the defined types were placed in this encounter.   Return precautions given.   Risks, benefits, and alternatives of the medications and treatment plan prescribed today were discussed, and patient expressed understanding.   Education regarding symptom management and diagnosis given to patient on AVS.  Continue to follow with TULLO, Aris Everts, MD for routine health maintenance.   Trenda Moots and I agreed with plan.   Mable Paris, FNP

## 2016-07-02 NOTE — Progress Notes (Signed)
Pre visit review using our clinic review tool, if applicable. No additional management support is needed unless otherwise documented below in the visit note. 

## 2016-07-02 NOTE — Telephone Encounter (Signed)
Spoke with pt about labs Gi referral, RUQ [place], repeat labs in 3 days  600mg  2-3 time per day Alcohol 2-3 glasses wine per night  Advised to hold NSAIDs, alcohol. If fever, N, V, go to ed

## 2016-07-02 NOTE — Progress Notes (Signed)
Subjective:    Patient ID: Jessica Zuniga, female    DOB: 12-28-1960, 55 y.o.   MRN: [id number]  CC: TEAUNA Jessica Zuniga is a 55 y.o. female who presents today for an acute visit.    HPI: CC: slight cough x one month. No real sinus congestion. Endorses low grade fever 99-100, chills, bodyaches in the early morning. Ibuprofen with some relief. Overall just doesn't feel well.         HISTORY:  Past Medical History:  Diagnosis Date  . Chest pain   . HLD (hyperlipidemia)   . Papilloma of breast    right breast   Past Surgical History:  Procedure Laterality Date  . BREAST BIOPSY Right 12/2012   neg stereo  . BREAST EXCISIONAL BIOPSY Right 1997   neg  . BREAST SURGERY Right 1997   papilloma    Family History  Problem Relation Age of Onset  . Heart attack Father     heart transplant  . Heart disease Paternal Grandmother   . Heart disease Maternal Grandmother   . Breast cancer Neg Hx     Allergies: Patient has no known allergies. No current outpatient prescriptions on file prior to visit.   No current facility-administered medications on file prior to visit.     Social History  Substance Use Topics  . Smoking status: Never Smoker  . Smokeless tobacco: Never Used     Comment: tobacco use - no  . Alcohol use Yes    Review of Systems  Constitutional: Positive for chills and fever (low grade).  HENT: Positive for congestion. Negative for sinus pressure.   Respiratory: Negative for cough and choking.   Cardiovascular: Negative for chest pain and palpitations.  Gastrointestinal: Negative for abdominal distention, abdominal pain, constipation, nausea and vomiting.  Genitourinary: Negative for dysuria, flank pain and frequency.      Objective:    BP 136/70   Pulse 81   Temp 98 F (36.7 C) (Oral)   Ht 5' 6.5" (1.689 m)   Wt 169 lb 9.6 oz (76.9 kg)   SpO2 97%   BMI 26.96 kg/m    Physical Exam  Constitutional: She appears well-developed and well-nourished.  HENT:    Head: Normocephalic and atraumatic.  Right Ear: Hearing, tympanic membrane, external ear and ear canal normal. No drainage, swelling or tenderness. No foreign bodies. Tympanic membrane is not erythematous and not bulging. No middle ear effusion. No decreased hearing is noted.  Left Ear: Hearing, tympanic membrane, external ear and ear canal normal. No drainage, swelling or tenderness. No foreign bodies. Tympanic membrane is not erythematous and not bulging.  No middle ear effusion. No decreased hearing is noted.  Nose: Nose normal. No rhinorrhea. Right sinus exhibits no maxillary sinus tenderness and no frontal sinus tenderness. Left sinus exhibits no maxillary sinus tenderness and no frontal sinus tenderness.  Mouth/Throat: Uvula is midline, oropharynx is clear and moist and mucous membranes are normal. No oropharyngeal exudate, posterior oropharyngeal edema, posterior oropharyngeal erythema or tonsillar abscesses.  Eyes: Conjunctivae are normal.  Cardiovascular: Normal rate, regular rhythm, normal heart sounds and normal pulses.   Pulmonary/Chest: Effort normal and breath sounds normal. She has no wheezes. She has no rhonchi. She has no rales.  Abdominal: There is no CVA tenderness.  Lymphadenopathy:       Head (right side): No submental, no submandibular, no tonsillar, no preauricular, no posterior auricular and no occipital adenopathy present.       Head (left side): No  submental, no submandibular, no tonsillar, no preauricular, no posterior auricular and no occipital adenopathy present.    She has no cervical adenopathy.  Neurological: She is alert.  Skin: Skin is warm and dry.  Psychiatric: She has a normal mood and affect. Her speech is normal and behavior is normal. Thought content normal.  Vitals reviewed.      Assessment & Plan:  1. Chills Unclear etiology of chills, general malaise at this time. Physical exam reassuring. Negative flu. Pending CXR, lab work. Considering viral  etiology.  - CBC - Comprehensive metabolic panel - POCT Urinalysis Dipstick - DG Chest 2 View; Future - POCT Influenza A/B  2. Urine findings abnormal UA doesn't indication infection. Pending culture to ensure.  - Urine Culture     I have discontinued Ms. Duell's zolpidem and levofloxacin.   No orders of the defined types were placed in this encounter.   Return precautions given.   Risks, benefits, and alternatives of the medications and treatment plan prescribed today were discussed, and patient expressed understanding.   Education regarding symptom management and diagnosis given to patient on AVS.  Continue to follow with TULLO, Aris Everts, MD for routine health maintenance.   Trenda Moots and I agreed with plan.   Mable Paris, FNP

## 2016-07-03 ENCOUNTER — Ambulatory Visit
Admission: RE | Admit: 2016-07-03 | Discharge: 2016-07-03 | Disposition: A | Discharge status: Home / Self Care | Payer: BLUE CROSS/BLUE SHIELD | Source: Ambulatory Visit | Attending: Family | Admitting: Family

## 2016-07-03 ENCOUNTER — Telehealth: Payer: Self-pay | Admitting: Family

## 2016-07-03 ENCOUNTER — Other Ambulatory Visit: Payer: Self-pay | Admitting: Family

## 2016-07-03 DIAGNOSIS — R748 Abnormal levels of other serum enzymes: Secondary | ICD-10-CM

## 2016-07-03 DIAGNOSIS — K760 Fatty (change of) liver, not elsewhere classified: Secondary | ICD-10-CM | POA: Diagnosis not present

## 2016-07-03 DIAGNOSIS — R945 Abnormal results of liver function studies: Secondary | ICD-10-CM | POA: Diagnosis not present

## 2016-07-03 LAB — URINE CULTURE: Organism ID, Bacteria: NO GROWTH

## 2016-07-03 NOTE — Telephone Encounter (Signed)
Appt with GI 07/15/16 Will come in tomorrow for labs Feeling well today; no F, N, V Discussed TSH, RUQ [place].

## 2016-07-04 ENCOUNTER — Other Ambulatory Visit (INDEPENDENT_AMBULATORY_CARE_PROVIDER_SITE_OTHER): Payer: BLUE CROSS/BLUE SHIELD

## 2016-07-04 ENCOUNTER — Telehealth: Payer: Self-pay | Admitting: Family

## 2016-07-04 DIAGNOSIS — R748 Abnormal levels of other serum enzymes: Secondary | ICD-10-CM

## 2016-07-04 LAB — COMPREHENSIVE METABOLIC PANEL
ALBUMIN: 4.6 g/dL (ref 3.5–5.2)
ALK PHOS: 70 U/L (ref 39–117)
ALT: 750 U/L — AB (ref 0–35)
AST: 486 U/L — AB (ref 0–37)
BILIRUBIN TOTAL: 0.9 mg/dL (ref 0.2–1.2)
BUN: 13 mg/dL (ref 6–23)
CALCIUM: 9.6 mg/dL (ref 8.4–10.5)
CO2: 28 mEq/L (ref 19–32)
CREATININE: 0.74 mg/dL (ref 0.40–1.20)
Chloride: 103 mEq/L (ref 96–112)
GFR: 85.8 mL/min (ref >60.00)
Glucose, Bld: 101 mg/dL — ABNORMAL HIGH (ref 70–99)
Potassium: 4 mEq/L (ref 3.5–5.1)
Sodium: 139 mEq/L (ref 135–145)
TOTAL PROTEIN: 7.6 g/dL (ref 6.0–8.3)

## 2016-07-04 LAB — LIPID PANEL
CHOLESTEROL: 216 mg/dL — AB (ref 0–200)
HDL: 59.5 mg/dL (ref >39.00)
LDL Cholesterol: 138 mg/dL — ABNORMAL HIGH (ref 0–99)
NONHDL: 156.05
TRIGLYCERIDES: 92 mg/dL (ref 0.0–149.0)
Total CHOL/HDL Ratio: 4
VLDL: 18.4 mg/dL (ref 0.0–40.0)

## 2016-07-04 NOTE — Telephone Encounter (Signed)
Discussed trending down AST/ALT Patient feeling well; no N. V, fever.  No alcohol, NSAIDs.   Will repeat CMP next week

## 2016-07-05 LAB — ACUTE HEP PANEL AND HEP B SURFACE AB
HCV Ab: NEGATIVE
Hep A IgM: NONREACTIVE
Hep B C IgM: NONREACTIVE
Hep B S Ab: NEGATIVE
Hepatitis B Surface Ag: NEGATIVE

## 2016-07-05 LAB — HIV ANTIBODY (ROUTINE TESTING W REFLEX): HIV: NONREACTIVE

## 2016-07-05 LAB — ANA W/REFLEX IF POSITIVE: ANA: NEGATIVE

## 2016-07-08 ENCOUNTER — Telehealth: Payer: Self-pay

## 2016-07-08 LAB — ANTI-SMOOTH MUSCLE ANTIBODY, IGG

## 2016-07-08 LAB — CERULOPLASMIN: CERULOPLASMIN: 40 mg/dL (ref 18–53)

## 2016-07-08 NOTE — Telephone Encounter (Signed)
Error

## 2016-07-09 ENCOUNTER — Telehealth: Payer: Self-pay | Admitting: Family

## 2016-07-09 ENCOUNTER — Other Ambulatory Visit (INDEPENDENT_AMBULATORY_CARE_PROVIDER_SITE_OTHER): Payer: BLUE CROSS/BLUE SHIELD

## 2016-07-09 DIAGNOSIS — R748 Abnormal levels of other serum enzymes: Secondary | ICD-10-CM

## 2016-07-09 LAB — COMPREHENSIVE METABOLIC PANEL
ALBUMIN: 4.2 g/dL (ref 3.5–5.2)
ALK PHOS: 61 U/L (ref 39–117)
ALT: 491 U/L — ABNORMAL HIGH (ref 0–35)
AST: 279 U/L — AB (ref 0–37)
BUN: 16 mg/dL (ref 6–23)
CHLORIDE: 103 meq/L (ref 96–112)
CO2: 28 mEq/L (ref 19–32)
CREATININE: 0.8 mg/dL (ref 0.40–1.20)
Calcium: 9.3 mg/dL (ref 8.4–10.5)
GFR: 78.41 mL/min (ref >60.00)
GLUCOSE: 116 mg/dL — AB (ref 70–99)
POTASSIUM: 4.4 meq/L (ref 3.5–5.1)
SODIUM: 138 meq/L (ref 135–145)
TOTAL PROTEIN: 6.7 g/dL (ref 6.0–8.3)
Total Bilirubin: 0.7 mg/dL (ref 0.2–1.2)

## 2016-07-09 LAB — MITOCHONDRIAL ANTIBODIES

## 2016-07-09 NOTE — Telephone Encounter (Signed)
Discussed liver enzymes continued improvement and normal liver function panel lab work Patient has appt with  GI next week Feeling 'much better'

## 2016-07-15 ENCOUNTER — Ambulatory Visit: Payer: Self-pay | Admitting: Gastroenterology

## 2016-07-23 ENCOUNTER — Ambulatory Visit (INDEPENDENT_AMBULATORY_CARE_PROVIDER_SITE_OTHER): Payer: BLUE CROSS/BLUE SHIELD | Admitting: Gastroenterology

## 2016-07-23 ENCOUNTER — Encounter: Payer: Self-pay | Admitting: Gastroenterology

## 2016-07-23 VITALS — BP 128/82 | HR 72 | Temp 98.0°F | Ht 66.5 in | Wt 162.0 lb

## 2016-07-23 DIAGNOSIS — R945 Abnormal results of liver function studies: Secondary | ICD-10-CM

## 2016-07-23 DIAGNOSIS — R7989 Other specified abnormal findings of blood chemistry: Secondary | ICD-10-CM | POA: Diagnosis not present

## 2016-07-23 NOTE — Patient Instructions (Signed)
You have been given a lab order today. Please go to [facility] outpatient lab or any [facility] draw station.  We would like you to repeat your liver function test prior to coming in for your follow up appointment.   If you have any questions or concerns, please contact our office.

## 2016-07-23 NOTE — Progress Notes (Signed)
Gastroenterology Consultation  Referring Provider:     Crecencio Mc, MD Primary Care Physician:  Jessica Mc, MD Primary Gastroenterologist:  Dr. Jonathon Zuniga  Reason for Consultation:     Abnormal LFT's        HPI:   Jessica Zuniga is a 56 y.o. y/o female referred for consultation & management  by Dr. Crecencio Mc, MD.    She has been referred for abnormal LFT's.  First noted abnormality in LFT's in 06/2016 incidentally when checked when she was having "chills" she felt she had a flu like illness- she was on ibuprofen 800 mg , only sick person , was having body aches . Normal LFT's 1 year back. No diarrhea 3 Alcohol use : she consumes 3-4 glasses of wine a week x 15 years , she may have had a few more drinks around that time. No jaundice. No abdominal pains   Drug use : never Over the counter herbal supplements none , no tylenol either  New medications no  Abdominal pain no  Tattoos no  Military service no  Prior blood transfusion no  Incarceration no  History of travel : Been to [place] 1 year back -[place] , [place] (did not get sick ), [place] in 2017 1st quarter-not sick  Family history of liver disease None  Recent change in weight : None  RUQ USG 06/2016 - showed fatty infiltration .  Labs 06/2016- Hb 13.9, normal platelet count and WCC.  TSH normal . Smooth muscle ab,HIV,AMA,ceruloplasmin,ANA,HbsAg,HCV ab ,hep AIgm-negative.   Hepatic Function Latest Ref Rng & Units 07/09/2016 07/04/2016 07/02/2016  Total Protein 6.0 - 8.3 g/dL 6.7 7.6 6.9  Albumin 3.5 - 5.2 g/dL 4.2 4.6 4.2  AST 0 - 37 U/L 279(H) 486(H) 532(H)  ALT 0 - 35 U/L 491(H) 750(H) 768(H)  Alk Phosphatase 39 - 117 U/L 61 70 68  Total Bilirubin 0.2 - 1.2 mg/dL 0.7 0.9 0.7      Past Medical History:  Diagnosis Date  . Chest pain   . HLD (hyperlipidemia)   . Papilloma of breast    right breast    Past Surgical History:  Procedure Laterality Date  . BREAST BIOPSY Right 12/2012   neg  stereo  . BREAST EXCISIONAL BIOPSY Right 1997   neg  . BREAST SURGERY Right 1997   papilloma     Prior to Admission medications   Not on File    Family History  Problem Relation Age of Onset  . Heart attack Father     heart transplant  . Heart disease Paternal Grandmother   . Heart disease Maternal Grandmother   . Breast cancer Neg Hx      Social History  Substance Use Topics  . Smoking status: Never Smoker  . Smokeless tobacco: Never Used     Comment: tobacco use - no  . Alcohol use Yes    Allergies as of 07/23/2016  . (No Known Allergies)    Review of Systems:    All systems reviewed and negative except where noted in HPI.   Physical Exam:  Ht 5' 6.5" (1.689 m)   Wt 162 lb (73.5 kg)   BMI 25.76 kg/m  No LMP recorded. Patient is postmenopausal. Psych:  Alert and cooperative. Normal mood and affect. General:   Alert,  Well-developed, well-nourished, pleasant and cooperative in NAD Head:  Normocephalic and atraumatic. Eyes:  Sclera clear, no icterus.   Conjunctiva pink. Ears:  Normal auditory acuity. Nose:  No deformity, discharge, or lesions. Mouth:  No deformity or lesions,oropharynx pink & moist. Neck:  Supple; no masses or thyromegaly. Lungs:  Few spider angiomas over neck; Respirations even and unlabored.  Clear throughout to auscultation.   No wheezes, crackles, or rhonchi. No acute distress. Heart:  Regular rate and rhythm; no murmurs, clicks, rubs, or gallops. Abdomen:  Normal bowel sounds.  No bruits.  Soft, non-tender and non-distended without masses, hepatosplenomegaly or hernias noted.  No guarding or rebound tenderness.    Neurologic:  Alert and oriented x3;  grossly normal neurologically. Skin:  Intact without significant lesions or rashes. No jaundice. Lymph Nodes:  No significant cervical adenopathy. Psych:  Alert and cooperative. Normal mood and affect.  Imaging Studies: Dg Chest 2 View  Result Date: 07/02/2016 CLINICAL DATA:  Chills and  fever EXAM: CHEST  2 VIEW COMPARISON:  None. FINDINGS: The heart size and mediastinal contours are within normal limits. Both lungs are clear. The visualized skeletal structures are unremarkable. IMPRESSION: No active cardiopulmonary disease. Electronically Signed   By: Inez Catalina M.D.   On: 07/02/2016 10:58   US Abdomen Limited Ruq  Result Date: 07/03/2016 CLINICAL DATA:  Elevated LFTs. EXAM: US ABDOMEN LIMITED - RIGHT UPPER QUADRANT COMPARISON:  None. FINDINGS: Gallbladder: No gallstones or wall thickening visualized. No sonographic Murphy sign noted by sonographer. Common bile duct: Diameter: 2.2 mm Liver: Liver is slightly echogenic consistent fatty infiltration and/or hepatocellular disease. No focal hepatic abnormality identified. IMPRESSION: 1. The liver is slightly echogenic consistent fatty infiltration and/or hepatocellular disease. No focal hepatic abnormality identified. 2. No gallstones.  No biliary distention. Electronically Signed   By: Marcello Moores  Register   On: 07/03/2016 10:39     [name]  56 y.o. female with elevated liver function tests most likely secondary to effects of alcohol and probably a viral illness like EBV. The fact that they are rapidly improving suggests either an infection/toxin or drug effect .   Further labs necessary to look for viral hepatitis, autoimmune liver disease, celiac disease, muscle disorders, Hemachromatosis, or A-1 antitrypsin deficiency.She also consumes alcohol more than suggested limits.    Plan : 1. Lab work ordered as above  2. Cut down alcohol consumption  3. Repeat LFT's in 3 weeks and follow up after to ensure the LFT's are resolved .   Follow up in 4 weeks   Dr Jessica Bellows MD

## 2016-07-25 LAB — HEPATIC FUNCTION PANEL
ALK PHOS: 64 IU/L (ref 39–117)
ALT: 334 IU/L — AB (ref 0–32)
AST: 231 IU/L — ABNORMAL HIGH (ref 0–40)
Albumin: 4.3 g/dL (ref 3.5–5.5)
BILIRUBIN, DIRECT: 0.19 mg/dL (ref 0.00–0.40)
Bilirubin Total: 0.5 mg/dL (ref 0.0–1.2)
TOTAL PROTEIN: 6.5 g/dL (ref 6.0–8.5)

## 2016-07-25 LAB — ALPHA-1-ANTITRYPSIN: A-1 Antitrypsin: 165 mg/dL (ref 90–200)

## 2016-07-25 LAB — EBV, CHRONIC/ACTIVE INFECTION
EBV Early Antigen Ab, IgG: >150 U/mL — ABNORMAL HIGH (ref 0.0–8.9)
EBV NA IGG: 437 U/mL — AB (ref 0.0–17.9)
EBV VCA IgG: >600 U/mL — ABNORMAL HIGH (ref 0.0–17.9)

## 2016-07-25 LAB — HEPATITIS A ANTIBODY, TOTAL: Hep A Total Ab: NEGATIVE

## 2016-07-25 LAB — HEPATITIS B E ANTIGEN: HEP B E AG: NEGATIVE

## 2016-07-25 LAB — CELIAC DISEASE PANEL
ENDOMYSIAL IGA: NEGATIVE
IgA/Immunoglobulin A, Serum: 158 mg/dL (ref 87–352)

## 2016-07-25 LAB — HEPATITIS B SURFACE ANTIGEN: Hepatitis B Surface Ag: NEGATIVE

## 2016-07-25 LAB — PROTIME-INR
INR: 1 (ref 0.8–1.2)
PROTHROMBIN TIME: 10.7 s (ref 9.1–12.0)

## 2016-07-25 LAB — CK: Total CK: 82 U/L (ref 24–173)

## 2016-07-25 LAB — HEPATITIS B CORE ANTIBODY, TOTAL: Hep B Core Total Ab: NEGATIVE

## 2016-07-25 LAB — ANTI-MICROSOMAL ANTIBODY LIVER / KIDNEY: LKM1 Ab: 2.3 Units (ref 0.0–20.0)

## 2016-07-25 LAB — HEPATITIS B SURFACE ANTIBODY, QUANTITATIVE: Hepatitis B Surf Ab Quant: <3.1 m[IU]/mL — ABNORMAL LOW (ref >9.9)

## 2016-07-25 LAB — GAMMA GT: GGT: 164 IU/L — AB (ref 0–60)

## 2016-07-25 LAB — HEPATITIS B E ANTIBODY: Hep B E Ab: NEGATIVE

## 2016-07-29 ENCOUNTER — Telehealth: Payer: Self-pay | Admitting: Gastroenterology

## 2016-07-29 NOTE — Telephone Encounter (Signed)
Pt notified of results

## 2016-07-29 NOTE — Telephone Encounter (Signed)
-----   Message from Jonathon Bellows, MD sent at 07/28/2016  8:37 AM EST ----- Lft's are improving so far all tests have been normal , more awaited. EBV serology suggests past infection, could be years back .

## 2016-07-29 NOTE — Telephone Encounter (Signed)
Patient would like to know results from her blood work

## 2016-08-14 ENCOUNTER — Other Ambulatory Visit: Payer: Self-pay | Admitting: Internal Medicine

## 2016-08-14 DIAGNOSIS — R5383 Other fatigue: Principal | ICD-10-CM

## 2016-08-14 DIAGNOSIS — R5381 Other malaise: Secondary | ICD-10-CM

## 2016-08-14 DIAGNOSIS — Z8249 Family history of ischemic heart disease and other diseases of the circulatory system: Secondary | ICD-10-CM

## 2016-08-19 ENCOUNTER — Telehealth: Payer: Self-pay

## 2016-08-19 DIAGNOSIS — Z8249 Family history of ischemic heart disease and other diseases of the circulatory system: Secondary | ICD-10-CM

## 2016-08-19 DIAGNOSIS — R059 Cough, unspecified: Secondary | ICD-10-CM

## 2016-08-19 DIAGNOSIS — R5381 Other malaise: Secondary | ICD-10-CM

## 2016-08-19 DIAGNOSIS — R05 Cough: Secondary | ICD-10-CM

## 2016-08-19 NOTE — Telephone Encounter (Signed)
Orders placed for ECHO and CT calcium score. Could we call her and set up ECHO and f/u w/ Dr. Rockey Situ? She can call [place] at her convenience to sched CT Calcium Score @ [phone].

## 2016-08-19 NOTE — Telephone Encounter (Signed)
-----   Message from Minna Merritts, MD sent at 08/18/2016  5:59 PM EST ----- Regarding: FW: referral Received message from Dr. Derrel Nip Can reorder CT coronary calcium score for strong family history. Also can reorder echocardiogram at her request, prior history of chest pain, recent malaise, cough. She is scheduled to see me in clinic but perhaps this can be pushed back so I can go over each of the tests with her  thx TG  ----- Message ----- From: Crecencio Mc, MD Sent: 08/18/2016   7:03 AM To: Minna Merritts, MD Subject: RE: referral                                   Yes, that's an excellent idea! If you don't mind ordering them, I would appreciate it!  Many thanks!  TT t Many thnaks ----- Message ----- From: Minna Merritts, MD Sent: 08/17/2016  11:27 AM To: Crecencio Mc, MD Subject: RE: referral                                   We could even order these 2 tests ahead of time and I could meet her to go over them? I can order if needed, let me know if you think thx TG  ----- Message ----- From: Crecencio Mc, MD Sent: 08/14/2016   7:12 AM To: Minna Merritts, MD Subject: referral                                       Good morning tim!  Chap's sister Katrielle has been seen by by our NP and by GI for persistent  malaise  x 1 month,  and elevated LFTS.  Diagnosed with fatty liver .  Given her strong FH of early heart disease , he's concerned she may have had a cardiac event.  I put in a referral to see you (last time was 2013) . Could you do an ECHO and cardiac CT ? Would give Melven Sartorius some peace of mind.  Thank you thank you thank you!!  TT

## 2016-08-19 NOTE — Telephone Encounter (Signed)
Lmov for patient to call back and reschedule appointment with Dr Rockey Situ so we may have echo (echo needs to be scheduled too) for the apportionment follow up

## 2016-08-20 ENCOUNTER — Ambulatory Visit: Payer: Self-pay | Admitting: Gastroenterology

## 2016-08-25 ENCOUNTER — Other Ambulatory Visit
Admission: RE | Admit: 2016-08-25 | Discharge: 2016-08-25 | Disposition: A | Discharge status: Home / Self Care | Payer: BLUE CROSS/BLUE SHIELD | Source: Ambulatory Visit | Attending: Gastroenterology | Admitting: Gastroenterology

## 2016-08-25 DIAGNOSIS — R7989 Other specified abnormal findings of blood chemistry: Secondary | ICD-10-CM | POA: Insufficient documentation

## 2016-08-25 LAB — HEPATIC FUNCTION PANEL
ALBUMIN: 3.4 g/dL — AB (ref 3.5–5.0)
ALK PHOS: 143 U/L — AB (ref 38–126)
ALT: 1708 U/L — AB (ref 14–54)
AST: 1686 U/L — ABNORMAL HIGH (ref 15–41)
Bilirubin, Direct: 1.7 mg/dL — ABNORMAL HIGH (ref 0.1–0.5)
Indirect Bilirubin: 2.2 mg/dL — ABNORMAL HIGH (ref 0.3–0.9)
TOTAL PROTEIN: 6.2 g/dL — AB (ref 6.5–8.1)
Total Bilirubin: 3.9 mg/dL — ABNORMAL HIGH (ref 0.3–1.2)

## 2016-08-26 ENCOUNTER — Telehealth: Payer: Self-pay

## 2016-08-26 ENCOUNTER — Ambulatory Visit: Payer: BLUE CROSS/BLUE SHIELD | Admitting: Cardiovascular Disease

## 2016-08-26 ENCOUNTER — Ambulatory Visit (INDEPENDENT_AMBULATORY_CARE_PROVIDER_SITE_OTHER): Payer: BLUE CROSS/BLUE SHIELD | Admitting: Gastroenterology

## 2016-08-26 ENCOUNTER — Other Ambulatory Visit
Admission: RE | Admit: 2016-08-26 | Discharge: 2016-08-26 | Disposition: A | Discharge status: Home / Self Care | Payer: BLUE CROSS/BLUE SHIELD | Source: Ambulatory Visit | Attending: Gastroenterology | Admitting: Gastroenterology

## 2016-08-26 ENCOUNTER — Telehealth: Payer: Self-pay | Admitting: Gastroenterology

## 2016-08-26 ENCOUNTER — Encounter: Payer: Self-pay | Admitting: Gastroenterology

## 2016-08-26 VITALS — BP 126/84 | HR 99 | Ht 66.5 in | Wt 163.0 lb

## 2016-08-26 DIAGNOSIS — R945 Abnormal results of liver function studies: Secondary | ICD-10-CM

## 2016-08-26 DIAGNOSIS — R7989 Other specified abnormal findings of blood chemistry: Secondary | ICD-10-CM | POA: Diagnosis not present

## 2016-08-26 DIAGNOSIS — R791 Abnormal coagulation profile: Secondary | ICD-10-CM | POA: Diagnosis not present

## 2016-08-26 DIAGNOSIS — Z6825 Body mass index (BMI) 25.0-25.9, adult: Secondary | ICD-10-CM | POA: Diagnosis not present

## 2016-08-26 DIAGNOSIS — R7309 Other abnormal glucose: Secondary | ICD-10-CM | POA: Diagnosis not present

## 2016-08-26 DIAGNOSIS — K7589 Other specified inflammatory liver diseases: Secondary | ICD-10-CM | POA: Diagnosis not present

## 2016-08-26 DIAGNOSIS — R5383 Other fatigue: Secondary | ICD-10-CM | POA: Diagnosis not present

## 2016-08-26 DIAGNOSIS — R109 Unspecified abdominal pain: Secondary | ICD-10-CM | POA: Diagnosis not present

## 2016-08-26 DIAGNOSIS — R6883 Chills (without fever): Secondary | ICD-10-CM | POA: Diagnosis not present

## 2016-08-26 LAB — HEPATIC FUNCTION PANEL
ALT: 1556 U/L — AB (ref 14–54)
AST: 1447 U/L — ABNORMAL HIGH (ref 15–41)
Albumin: 3.3 g/dL — ABNORMAL LOW (ref 3.5–5.0)
Alkaline Phosphatase: 132 U/L — ABNORMAL HIGH (ref 38–126)
BILIRUBIN DIRECT: 1.9 mg/dL — AB (ref 0.1–0.5)
BILIRUBIN INDIRECT: 2.8 mg/dL — AB (ref 0.3–0.9)
TOTAL PROTEIN: 6.1 g/dL — AB (ref 6.5–8.1)
Total Bilirubin: 4.7 mg/dL — ABNORMAL HIGH (ref 0.3–1.2)

## 2016-08-26 LAB — GAMMA GT: GGT: 475 U/L — ABNORMAL HIGH (ref 7–50)

## 2016-08-26 LAB — PROTIME-INR
INR: 1.52
PROTHROMBIN TIME: 18.5 s — AB (ref 11.4–15.2)

## 2016-08-26 LAB — ACETAMINOPHEN LEVEL

## 2016-08-26 NOTE — Telephone Encounter (Signed)
I called the patient explained INR 1.5, Tbilirubin is rising and suggests acute liver failure. I discussed her history with Dr Dwana Melena who suggested her to go to the ER at [facility] for admission and liver biopsy in the AM as lab work so far has been non contributory. She understood and will be going to the ER  Dr Jonathon Bellows  Gastroenterology/Hepatology Pager: [phone]

## 2016-08-26 NOTE — Progress Notes (Signed)
Primary Care Physician: Crecencio Mc, MD  Primary Gastroenterologist:  Dr. Jonathon Bellows   Chief Complaint  Patient presents with  . Follow-up    elevated liver enzymes    HPI: Jessica Zuniga is a 56 y.o. female .   She is here for follow up for abnormal LFT's. She was last seen on 07/23/16  Summary of history:  First noted abnormality in LFT's in 06/2016 incidentally when checked when she was having "chills" she felt she had a flu like illness- she was on ibuprofen 800 mg , only sick person , was having body aches . Normal LFT's 1 year back. No diarrhea 3 Alcohol use : she consumes 3-4 glasses of wine a week x 15 years , she may have had a few more drinks around that time. No jaundice. No abdominal pains  .  History of travel : Been to [place] 1 year back -[place] , [place] (did not get sick ), [place] in 2017 1st quarter-not sick  RUQ USG 06/2016 - showed fatty infiltration .  Labs 06/2016- Hb 13.9, normal platelet count and WCC.  TSH normal . Smooth muscle ab,HIV,AMA,ceruloplasmin,ANA,HbsAg,HCV ab ,hep AIgm-negative.   Interval history 07/2016-08/2016  LFT's 1/201/18 - had improved.   LFT's 08/25/16 - significant rise in transaminases- I called her and advised her to come today to discuss.  LKM ab,TTGIga,Hep Bcab,HbsAg/ab,Hep Beag/ab,Hep A ab -negative  A1AT-normal    No weight loss supplements. No mushrooms, no exposure to any new chemicals, No tylenol. Used ibuprofen 400 mg every day for 2 weeks.After her last visit with me she was feeling much better.   She thinks if at all may have had a low grade fever. Been playing a lot of golf. Has consumed 1 glass of wine every 3-4 days. No confusion. She has consumed a raw oyster. Has consumed a lot of shell fish which was cooked in [place] recently. No diarrhea, no vomiting.  Some generalized abdominal discomfort.62   No current outpatient prescriptions on file.   No current facility-administered medications for this  visit.     Allergies as of 08/26/2016  . (No Known Allergies)    ROS:  General: Negative for anorexia, weight loss, fever, chills, fatigue, weakness. ENT: Negative for hoarseness, difficulty swallowing , nasal congestion. CV: Negative for chest pain, angina, palpitations, dyspnea on exertion, peripheral edema.  Respiratory: Negative for dyspnea at rest, dyspnea on exertion, cough, sputum, wheezing.  GI: See history of present illness. GU:  Negative for dysuria, hematuria, urinary incontinence, urinary frequency, nocturnal urination.  Endo: Negative for unusual weight change.    Physical Examination:   BP 126/84   Pulse 99   Ht 5' 6.5" (1.689 m)   Wt 163 lb (73.9 kg)   BMI 25.91 kg/m   General: Well-nourished, well-developed in no acute distress.  Eyes: No icterus. Conjunctivae pink. Mouth: Oropharyngeal mucosa moist and pink , no lesions erythema or exudate. Lungs: Clear to auscultation bilaterally. Non-labored. Heart: Regular rate and rhythm, no murmurs rubs or gallops.  Abdomen: Bowel sounds are normal, nontender, nondistended, no hepatosplenomegaly or masses, no abdominal bruits or hernia , no rebound or guarding.   Extremities: No lower extremity edema. No clubbing or deformities. Neuro: Alert and oriented x 3.  Grossly intact. Skin: Warm and dry, no jaundice.   Psych: Alert and cooperative, normal mood and affect.  Labs:  Hepatic Function Latest Ref Rng & Units 08/25/2016 07/23/2016 07/09/2016  Total Protein 6.5 - 8.1 g/dL 6.2(L) 6.5 6.7  Albumin 3.5 - 5.0 g/dL 3.4(L) 4.3 4.2  AST 15 - 41 U/L 1,686(H) 231(H) 279(H)  ALT 14 - 54 U/L 1,708(H) 334(H) 491(H)  Alk Phosphatase 38 - 126 U/L 143(H) 64 61  Total Bilirubin 0.3 - 1.2 mg/dL 3.9(H) 0.5 0.7  Bilirubin, Direct 0.1 - 0.5 mg/dL 1.7(H) 0.19 -     Imaging Studies: No results found.  Assessment and Plan:    Jessica Zuniga  56 y.o. female with elevated liver function tests . So far all tests have neither shown acute  infection , no evidence of aiutoimmune hepatitis. It is surprising that the LFT's particularly the transaminases actually were trending down and when checked again yesterday were significantly elevated. Based on history there are no new drugs, toxins, weight loss supplements .  There are odd case reports of Ibuprofen causing liver injury .    Plan : 1. Repeat labs for acute hepatitis A,B,C,E(history of travel abroad), IGM ab for HSV,CMV,EBV,VZV with viral loads 2. USG liver and doppler of portal veins 3. Check INR 4. If not rapidly improving significantly over next 24-48 hours then will need liver biopsy and likely a second opinion with [facility] or [facility] .  5. Advised that if there are any mental state changes she should go to the ER as encephelopathy is a sign of liver failure.  6. No tylenol, advil, OTC meds,alcohol . 7. No shell fish  8. I will schedule a liver biopsy and based on the lab results we receive ove rthe next 48-72 hours we can decide if we wish to go ahead  Dr Jonathon Bellows  MD

## 2016-08-26 NOTE — Telephone Encounter (Signed)
All information was discussed with pt by Dr. Vicente Males via telephone.

## 2016-08-26 NOTE — Addendum Note (Signed)
Addended by: Eliezer Lofts on: 08/26/2016 03:38 PM   Modules accepted: Orders

## 2016-08-26 NOTE — Telephone Encounter (Signed)
-----   Message from Jonathon Bellows, MD sent at 08/25/2016  3:39 PM EST ----- Jessica Zuniga,   Please call the patient - LFT's have gone up very high all of a sudden , not sure why .   1. Check with patient how she is doing- If having any confusion come to ER 2. No tylenol, herbal medications.  3. Check INR, Hep B viral load, Hep C viral load. Hep A IGM, Ck, liver doppler ultrasound to rule out portal vein thrombosis , CMV IGM, HSV IGM, EBV IGM asap 4. Check if she has flu like symptoms 5. Ensure she keeps her appointment tomorrow to see me  6. If she is feeling unwell then needs to come to ER to get admitted.

## 2016-08-27 ENCOUNTER — Ambulatory Visit: Payer: Self-pay | Admitting: Gastroenterology

## 2016-08-27 DIAGNOSIS — R5383 Other fatigue: Secondary | ICD-10-CM | POA: Diagnosis not present

## 2016-08-27 DIAGNOSIS — R740 Nonspecific elevation of levels of transaminase and lactic acid dehydrogenase [LDH]: Secondary | ICD-10-CM | POA: Diagnosis not present

## 2016-08-27 DIAGNOSIS — R945 Abnormal results of liver function studies: Secondary | ICD-10-CM | POA: Diagnosis not present

## 2016-08-27 DIAGNOSIS — R739 Hyperglycemia, unspecified: Secondary | ICD-10-CM | POA: Diagnosis not present

## 2016-08-27 DIAGNOSIS — K7689 Other specified diseases of liver: Secondary | ICD-10-CM | POA: Diagnosis not present

## 2016-08-27 DIAGNOSIS — K7589 Other specified inflammatory liver diseases: Secondary | ICD-10-CM | POA: Diagnosis not present

## 2016-08-27 DIAGNOSIS — R140 Abdominal distension (gaseous): Secondary | ICD-10-CM | POA: Diagnosis not present

## 2016-08-27 LAB — MISC LABCORP TEST (SEND OUT)
LABCORP TEST CODE: 96727
LABCORP TEST CODE: 96776

## 2016-08-27 LAB — HEPATITIS B DNA, ULTRAQUANTITATIVE, PCR
HBV DNA SERPL PCR-ACNC: NOT DETECTED [IU]/mL
HBV DNA SERPL PCR-LOG IU: UNDETERMINED {Log_IU}/mL

## 2016-08-27 LAB — HEPATITIS A ANTIBODY, IGM: Hep A IgM: NEGATIVE

## 2016-08-27 LAB — HEPATITIS B SURFACE ANTIGEN: HEP B S AG: NEGATIVE

## 2016-08-27 LAB — HCV COMMENT:

## 2016-08-27 LAB — HEPATITIS B CORE ANTIBODY, IGM: HEP B C IGM: NEGATIVE

## 2016-08-27 LAB — HIV ANTIBODY (ROUTINE TESTING W REFLEX): HIV Screen 4th Generation wRfx: NONREACTIVE

## 2016-08-27 LAB — HSV 1 AND 2 IGM ABS, INDIRECT

## 2016-08-27 LAB — HEPATITIS C ANTIBODY (REFLEX): HCV Ab: <0.1 s/co ratio (ref 0.0–0.9)

## 2016-08-27 LAB — CMV ANTIBODY, IGG (EIA): CMV Ab - IgG: <0.6 U/mL (ref 0.00–0.59)

## 2016-08-27 LAB — EPSTEIN-BARR VIRUS VCA, IGM

## 2016-08-27 LAB — HEPATITIS B E ANTIGEN: Hep B E Ag: NEGATIVE

## 2016-08-28 ENCOUNTER — Ambulatory Visit: Payer: BLUE CROSS/BLUE SHIELD

## 2016-08-28 DIAGNOSIS — R945 Abnormal results of liver function studies: Secondary | ICD-10-CM | POA: Diagnosis not present

## 2016-08-28 DIAGNOSIS — R740 Nonspecific elevation of levels of transaminase and lactic acid dehydrogenase [LDH]: Secondary | ICD-10-CM | POA: Diagnosis not present

## 2016-08-28 DIAGNOSIS — K7589 Other specified inflammatory liver diseases: Secondary | ICD-10-CM | POA: Diagnosis not present

## 2016-08-28 DIAGNOSIS — K759 Inflammatory liver disease, unspecified: Secondary | ICD-10-CM | POA: Diagnosis not present

## 2016-08-28 DIAGNOSIS — Z6825 Body mass index (BMI) 25.0-25.9, adult: Secondary | ICD-10-CM | POA: Diagnosis not present

## 2016-08-28 LAB — MISC LABCORP TEST (SEND OUT): Labcorp test code: 138230

## 2016-08-28 LAB — VARICELLA-ZOSTER BY PCR: Varicella-Zoster, PCR: NEGATIVE

## 2016-08-28 LAB — HEPATITIS C VRS RNA DETECT BY PCR-QUAL: Hepatitis C Vrs RNA by PCR-Qual: NEGATIVE

## 2016-08-29 DIAGNOSIS — K759 Inflammatory liver disease, unspecified: Secondary | ICD-10-CM | POA: Diagnosis not present

## 2016-08-29 DIAGNOSIS — R5383 Other fatigue: Secondary | ICD-10-CM | POA: Diagnosis not present

## 2016-08-29 DIAGNOSIS — R945 Abnormal results of liver function studies: Secondary | ICD-10-CM | POA: Diagnosis not present

## 2016-08-29 DIAGNOSIS — Z6825 Body mass index (BMI) 25.0-25.9, adult: Secondary | ICD-10-CM | POA: Diagnosis not present

## 2016-08-29 DIAGNOSIS — K7589 Other specified inflammatory liver diseases: Secondary | ICD-10-CM | POA: Diagnosis not present

## 2016-09-02 DIAGNOSIS — R748 Abnormal levels of other serum enzymes: Secondary | ICD-10-CM | POA: Diagnosis not present

## 2016-09-02 DIAGNOSIS — Z6826 Body mass index (BMI) 26.0-26.9, adult: Secondary | ICD-10-CM | POA: Diagnosis not present

## 2016-09-03 ENCOUNTER — Other Ambulatory Visit: Payer: Self-pay | Admitting: Gastroenterology

## 2016-09-03 DIAGNOSIS — Z1159 Encounter for screening for other viral diseases: Secondary | ICD-10-CM | POA: Diagnosis not present

## 2016-09-03 DIAGNOSIS — K760 Fatty (change of) liver, not elsewhere classified: Secondary | ICD-10-CM | POA: Diagnosis not present

## 2016-09-03 DIAGNOSIS — R748 Abnormal levels of other serum enzymes: Secondary | ICD-10-CM | POA: Diagnosis not present

## 2016-09-05 DIAGNOSIS — R740 Nonspecific elevation of levels of transaminase and lactic acid dehydrogenase [LDH]: Secondary | ICD-10-CM | POA: Diagnosis not present

## 2016-09-05 DIAGNOSIS — R7989 Other specified abnormal findings of blood chemistry: Secondary | ICD-10-CM | POA: Diagnosis not present

## 2016-09-05 DIAGNOSIS — Z79899 Other long term (current) drug therapy: Secondary | ICD-10-CM | POA: Diagnosis not present

## 2016-09-05 LAB — GAMMA GT: GGT: 578 IU/L — AB (ref 0–60)

## 2016-09-05 LAB — CMV ABS, IGG+IGM (CYTOMEGALOVIRUS): CMV Ab - IgG: <0.6 U/mL (ref 0.00–0.59)

## 2016-09-05 LAB — HEPATIC FUNCTION PANEL
ALT: 676 IU/L (ref 0–32)
AST: 339 IU/L — ABNORMAL HIGH (ref 0–40)
Albumin: 3.5 g/dL (ref 3.5–5.5)
Alkaline Phosphatase: 169 IU/L — ABNORMAL HIGH (ref 39–117)
BILIRUBIN TOTAL: 4.5 mg/dL — AB (ref 0.0–1.2)
Bilirubin, Direct: 2.28 mg/dL — ABNORMAL HIGH (ref 0.00–0.40)
TOTAL PROTEIN: 6.2 g/dL (ref 6.0–8.5)

## 2016-09-05 LAB — HEPATITIS B DNA, ULTRAQUANTITATIVE, PCR: HBV DNA SERPL PCR-ACNC: NOT DETECTED IU/mL

## 2016-09-05 LAB — HEPATITIS B CORE ANTIBODY, IGM: Hep B C IgM: NEGATIVE

## 2016-09-05 LAB — HEPATITIS C VRS RNA DETECT BY PCR-QUAL: HCV RNA NAA Qualitative: NEGATIVE

## 2016-09-05 LAB — EPSTEIN BARR VRS(EBV DNA BY PCR): EPSTEIN-BARR DNA QUANT, PCR: POSITIVE {copies}/mL

## 2016-09-05 LAB — VARICELLA ZOSTER ANTIBODY, IGM: VARICELLA IGM: 1.17 {index} — AB (ref 0.00–0.90)

## 2016-09-05 LAB — EPSTEIN-BARR VIRUS VCA, IGM

## 2016-09-05 LAB — HEPATITIS B SURFACE ANTIGEN: Hepatitis B Surface Ag: NEGATIVE

## 2016-09-05 LAB — PROTIME-INR
INR: 1.4 — AB (ref 0.8–1.2)
Prothrombin Time: 15 s — ABNORMAL HIGH (ref 9.1–12.0)

## 2016-09-05 LAB — HSV 1 AND 2 IGM ABS, INDIRECT
HSV 1 IgM: <1:10 {titer}
HSV 2 IgM: <1:10 {titer}

## 2016-09-05 LAB — ACETAMINOPHEN LEVEL: Acetaminophen (Tylenol), S: NEGATIVE ug/mL (ref 10–30)

## 2016-09-05 LAB — HCV COMMENT:

## 2016-09-05 LAB — CK: Total CK: 61 U/L (ref 24–173)

## 2016-09-05 LAB — HEPATITIS B E ANTIGEN: HEP B E AG: NEGATIVE

## 2016-09-05 LAB — HIV ANTIBODY (ROUTINE TESTING W REFLEX): HIV Screen 4th Generation wRfx: NONREACTIVE

## 2016-09-05 LAB — HEPATITIS C ANTIBODY (REFLEX): HCV Ab: 0.1 s/co ratio (ref 0.0–0.9)

## 2016-09-05 LAB — HEPATITIS A ANTIBODY, IGM: HEP A IGM: NEGATIVE

## 2016-09-08 ENCOUNTER — Encounter: Payer: Self-pay | Admitting: Cardiovascular Disease

## 2016-09-09 DIAGNOSIS — R899 Unspecified abnormal finding in specimens from other organs, systems and tissues: Secondary | ICD-10-CM | POA: Diagnosis not present

## 2016-09-09 DIAGNOSIS — R791 Abnormal coagulation profile: Secondary | ICD-10-CM | POA: Diagnosis not present

## 2016-09-09 DIAGNOSIS — R188 Other ascites: Secondary | ICD-10-CM | POA: Diagnosis not present

## 2016-09-09 DIAGNOSIS — R748 Abnormal levels of other serum enzymes: Secondary | ICD-10-CM | POA: Diagnosis not present

## 2016-09-09 DIAGNOSIS — K828 Other specified diseases of gallbladder: Secondary | ICD-10-CM | POA: Diagnosis not present

## 2016-09-09 DIAGNOSIS — N281 Cyst of kidney, acquired: Secondary | ICD-10-CM | POA: Diagnosis not present

## 2016-09-09 DIAGNOSIS — K759 Inflammatory liver disease, unspecified: Secondary | ICD-10-CM | POA: Diagnosis not present

## 2016-09-09 DIAGNOSIS — Z7952 Long term (current) use of systemic steroids: Secondary | ICD-10-CM | POA: Diagnosis not present

## 2016-09-09 DIAGNOSIS — Z6826 Body mass index (BMI) 26.0-26.9, adult: Secondary | ICD-10-CM | POA: Diagnosis not present

## 2016-09-09 DIAGNOSIS — R5383 Other fatigue: Secondary | ICD-10-CM | POA: Diagnosis not present

## 2016-09-11 DIAGNOSIS — L708 Other acne: Secondary | ICD-10-CM | POA: Diagnosis not present

## 2016-09-11 DIAGNOSIS — R748 Abnormal levels of other serum enzymes: Secondary | ICD-10-CM | POA: Diagnosis not present

## 2016-09-11 DIAGNOSIS — L738 Other specified follicular disorders: Secondary | ICD-10-CM | POA: Diagnosis not present

## 2016-09-11 DIAGNOSIS — R898 Other abnormal findings in specimens from other organs, systems and tissues: Secondary | ICD-10-CM | POA: Diagnosis not present

## 2016-09-11 DIAGNOSIS — T380X5A Adverse effect of glucocorticoids and synthetic analogues, initial encounter: Secondary | ICD-10-CM | POA: Diagnosis not present

## 2016-09-11 DIAGNOSIS — R21 Rash and other nonspecific skin eruption: Secondary | ICD-10-CM | POA: Diagnosis not present

## 2016-09-12 DIAGNOSIS — Z720 Tobacco use: Secondary | ICD-10-CM | POA: Diagnosis not present

## 2016-09-12 DIAGNOSIS — K7689 Other specified diseases of liver: Secondary | ICD-10-CM | POA: Diagnosis not present

## 2016-09-12 DIAGNOSIS — Z7952 Long term (current) use of systemic steroids: Secondary | ICD-10-CM | POA: Diagnosis not present

## 2016-09-12 DIAGNOSIS — Z6827 Body mass index (BMI) 27.0-27.9, adult: Secondary | ICD-10-CM | POA: Diagnosis not present

## 2016-09-12 DIAGNOSIS — B179 Acute viral hepatitis, unspecified: Secondary | ICD-10-CM | POA: Diagnosis not present

## 2016-09-12 DIAGNOSIS — Z792 Long term (current) use of antibiotics: Secondary | ICD-10-CM | POA: Diagnosis not present

## 2016-09-12 DIAGNOSIS — K759 Inflammatory liver disease, unspecified: Secondary | ICD-10-CM | POA: Diagnosis not present

## 2016-09-12 DIAGNOSIS — D899 Disorder involving the immune mechanism, unspecified: Secondary | ICD-10-CM | POA: Diagnosis not present

## 2016-09-12 DIAGNOSIS — R7989 Other specified abnormal findings of blood chemistry: Secondary | ICD-10-CM | POA: Diagnosis not present

## 2016-09-12 DIAGNOSIS — R899 Unspecified abnormal finding in specimens from other organs, systems and tissues: Secondary | ICD-10-CM | POA: Diagnosis not present

## 2016-09-12 DIAGNOSIS — Z79899 Other long term (current) drug therapy: Secondary | ICD-10-CM | POA: Diagnosis not present

## 2016-09-16 DIAGNOSIS — B179 Acute viral hepatitis, unspecified: Secondary | ICD-10-CM | POA: Diagnosis not present

## 2016-09-16 DIAGNOSIS — Z6826 Body mass index (BMI) 26.0-26.9, adult: Secondary | ICD-10-CM | POA: Diagnosis not present

## 2016-09-16 DIAGNOSIS — R748 Abnormal levels of other serum enzymes: Secondary | ICD-10-CM | POA: Diagnosis not present

## 2016-09-16 DIAGNOSIS — Z7952 Long term (current) use of systemic steroids: Secondary | ICD-10-CM | POA: Diagnosis not present

## 2016-09-23 DIAGNOSIS — R748 Abnormal levels of other serum enzymes: Secondary | ICD-10-CM | POA: Diagnosis not present

## 2016-09-30 DIAGNOSIS — D696 Thrombocytopenia, unspecified: Secondary | ICD-10-CM | POA: Diagnosis not present

## 2016-09-30 DIAGNOSIS — K7689 Other specified diseases of liver: Secondary | ICD-10-CM | POA: Diagnosis not present

## 2016-09-30 DIAGNOSIS — B179 Acute viral hepatitis, unspecified: Secondary | ICD-10-CM | POA: Diagnosis not present

## 2016-09-30 DIAGNOSIS — R748 Abnormal levels of other serum enzymes: Secondary | ICD-10-CM | POA: Diagnosis not present

## 2016-09-30 DIAGNOSIS — Z6826 Body mass index (BMI) 26.0-26.9, adult: Secondary | ICD-10-CM | POA: Diagnosis not present

## 2016-10-01 DIAGNOSIS — K754 Autoimmune hepatitis: Secondary | ICD-10-CM | POA: Diagnosis not present

## 2016-10-01 DIAGNOSIS — Z79899 Other long term (current) drug therapy: Secondary | ICD-10-CM | POA: Diagnosis not present

## 2016-10-15 DIAGNOSIS — K7689 Other specified diseases of liver: Secondary | ICD-10-CM | POA: Diagnosis not present

## 2016-10-29 DIAGNOSIS — Z79899 Other long term (current) drug therapy: Secondary | ICD-10-CM | POA: Diagnosis not present

## 2016-10-29 DIAGNOSIS — R509 Fever, unspecified: Secondary | ICD-10-CM | POA: Diagnosis not present

## 2016-10-29 DIAGNOSIS — K754 Autoimmune hepatitis: Secondary | ICD-10-CM | POA: Diagnosis not present

## 2016-10-29 DIAGNOSIS — H538 Other visual disturbances: Secondary | ICD-10-CM | POA: Diagnosis not present

## 2016-10-29 DIAGNOSIS — Z6825 Body mass index (BMI) 25.0-25.9, adult: Secondary | ICD-10-CM | POA: Diagnosis not present

## 2016-10-30 DIAGNOSIS — R509 Fever, unspecified: Secondary | ICD-10-CM | POA: Diagnosis not present

## 2016-10-30 DIAGNOSIS — K759 Inflammatory liver disease, unspecified: Secondary | ICD-10-CM | POA: Diagnosis not present

## 2016-11-05 DIAGNOSIS — D696 Thrombocytopenia, unspecified: Secondary | ICD-10-CM | POA: Diagnosis not present

## 2016-11-05 DIAGNOSIS — K754 Autoimmune hepatitis: Secondary | ICD-10-CM | POA: Diagnosis not present

## 2016-11-05 DIAGNOSIS — Z7952 Long term (current) use of systemic steroids: Secondary | ICD-10-CM | POA: Diagnosis not present

## 2016-11-05 DIAGNOSIS — R932 Abnormal findings on diagnostic imaging of liver and biliary tract: Secondary | ICD-10-CM | POA: Diagnosis not present

## 2016-11-05 DIAGNOSIS — K759 Inflammatory liver disease, unspecified: Secondary | ICD-10-CM | POA: Diagnosis not present

## 2016-11-05 DIAGNOSIS — R748 Abnormal levels of other serum enzymes: Secondary | ICD-10-CM | POA: Diagnosis not present

## 2016-11-05 DIAGNOSIS — K7460 Unspecified cirrhosis of liver: Secondary | ICD-10-CM | POA: Diagnosis not present

## 2016-11-05 DIAGNOSIS — R160 Hepatomegaly, not elsewhere classified: Secondary | ICD-10-CM | POA: Diagnosis not present

## 2016-11-05 DIAGNOSIS — Z6825 Body mass index (BMI) 25.0-25.9, adult: Secondary | ICD-10-CM | POA: Diagnosis not present

## 2016-11-05 DIAGNOSIS — R188 Other ascites: Secondary | ICD-10-CM | POA: Diagnosis not present

## 2016-11-18 DIAGNOSIS — K754 Autoimmune hepatitis: Secondary | ICD-10-CM | POA: Diagnosis not present

## 2016-11-18 DIAGNOSIS — Z7952 Long term (current) use of systemic steroids: Secondary | ICD-10-CM | POA: Diagnosis not present

## 2016-11-25 DIAGNOSIS — K754 Autoimmune hepatitis: Secondary | ICD-10-CM | POA: Diagnosis not present

## 2016-12-03 DIAGNOSIS — R932 Abnormal findings on diagnostic imaging of liver and biliary tract: Secondary | ICD-10-CM | POA: Diagnosis not present

## 2016-12-03 DIAGNOSIS — Z792 Long term (current) use of antibiotics: Secondary | ICD-10-CM | POA: Diagnosis not present

## 2016-12-03 DIAGNOSIS — R29898 Other symptoms and signs involving the musculoskeletal system: Secondary | ICD-10-CM | POA: Diagnosis not present

## 2016-12-03 DIAGNOSIS — B179 Acute viral hepatitis, unspecified: Secondary | ICD-10-CM | POA: Diagnosis not present

## 2016-12-03 DIAGNOSIS — Z79899 Other long term (current) drug therapy: Secondary | ICD-10-CM | POA: Diagnosis not present

## 2016-12-03 DIAGNOSIS — T380X5D Adverse effect of glucocorticoids and synthetic analogues, subsequent encounter: Secondary | ICD-10-CM | POA: Diagnosis not present

## 2016-12-03 DIAGNOSIS — Z6825 Body mass index (BMI) 25.0-25.9, adult: Secondary | ICD-10-CM | POA: Diagnosis not present

## 2016-12-03 DIAGNOSIS — Z888 Allergy status to other drugs, medicaments and biological substances status: Secondary | ICD-10-CM | POA: Diagnosis not present

## 2016-12-03 DIAGNOSIS — K754 Autoimmune hepatitis: Secondary | ICD-10-CM | POA: Diagnosis not present

## 2016-12-03 DIAGNOSIS — D696 Thrombocytopenia, unspecified: Secondary | ICD-10-CM | POA: Diagnosis not present

## 2016-12-03 DIAGNOSIS — L538 Other specified erythematous conditions: Secondary | ICD-10-CM | POA: Diagnosis not present

## 2016-12-03 DIAGNOSIS — Z7952 Long term (current) use of systemic steroids: Secondary | ICD-10-CM | POA: Diagnosis not present

## 2016-12-15 DIAGNOSIS — K754 Autoimmune hepatitis: Secondary | ICD-10-CM | POA: Diagnosis not present

## 2016-12-17 DIAGNOSIS — Z23 Encounter for immunization: Secondary | ICD-10-CM | POA: Diagnosis not present

## 2017-01-07 DIAGNOSIS — Z6825 Body mass index (BMI) 25.0-25.9, adult: Secondary | ICD-10-CM | POA: Diagnosis not present

## 2017-01-07 DIAGNOSIS — R188 Other ascites: Secondary | ICD-10-CM | POA: Diagnosis not present

## 2017-01-07 DIAGNOSIS — R945 Abnormal results of liver function studies: Secondary | ICD-10-CM | POA: Diagnosis not present

## 2017-01-07 DIAGNOSIS — K7689 Other specified diseases of liver: Secondary | ICD-10-CM | POA: Diagnosis not present

## 2017-01-07 DIAGNOSIS — D696 Thrombocytopenia, unspecified: Secondary | ICD-10-CM | POA: Diagnosis not present

## 2017-01-07 DIAGNOSIS — K754 Autoimmune hepatitis: Secondary | ICD-10-CM | POA: Diagnosis not present

## 2017-01-07 DIAGNOSIS — Z79899 Other long term (current) drug therapy: Secondary | ICD-10-CM | POA: Diagnosis not present

## 2017-01-07 DIAGNOSIS — Z888 Allergy status to other drugs, medicaments and biological substances status: Secondary | ICD-10-CM | POA: Diagnosis not present

## 2017-01-07 DIAGNOSIS — K829 Disease of gallbladder, unspecified: Secondary | ICD-10-CM | POA: Diagnosis not present

## 2017-01-07 DIAGNOSIS — Z7952 Long term (current) use of systemic steroids: Secondary | ICD-10-CM | POA: Diagnosis not present

## 2017-01-08 DIAGNOSIS — H2513 Age-related nuclear cataract, bilateral: Secondary | ICD-10-CM | POA: Diagnosis not present

## 2017-01-08 DIAGNOSIS — H4423 Degenerative myopia, bilateral: Secondary | ICD-10-CM | POA: Diagnosis not present

## 2017-01-08 DIAGNOSIS — H04123 Dry eye syndrome of bilateral lacrimal glands: Secondary | ICD-10-CM | POA: Diagnosis not present

## 2017-01-08 DIAGNOSIS — H40053 Ocular hypertension, bilateral: Secondary | ICD-10-CM | POA: Diagnosis not present

## 2017-01-15 MED ORDER — PREDNISONE 10 MG TABLET: 15 mg | tablet | Freq: Every day | 6 refills | 0 days | Status: AC

## 2017-01-15 MED ORDER — PREDNISONE 10 MG TABLET
ORAL_TABLET | Freq: Every day | ORAL | 6 refills | 0.00000 days | Status: CP
Start: 2017-01-15 — End: 2017-01-15

## 2017-01-20 DIAGNOSIS — K754 Autoimmune hepatitis: Secondary | ICD-10-CM | POA: Diagnosis not present

## 2017-01-29 ENCOUNTER — Ambulatory Visit: Admission: RE | Admit: 2017-01-29 | Discharge: 2017-01-29 | Payer: BC Managed Care – PPO

## 2017-02-04 NOTE — Unmapped (Signed)
Attempted to contact patient about mycophenolate refill, left message for her to call-back.     Casimer Bilis, PharmD Candidate    Silas Flood. Meta Hatchet, PharmD, [facility]  [phone]

## 2017-02-04 NOTE — Unmapped (Signed)
[facility] Refill Coordination Note  Specialty Medication(s): MYCOPHENOLATE 500  Additional Medications shipped:     Margaret Berger, DOB: [birth year]  Phone: [phone] (home) , Alternate phone contact: N/A  Phone or address changes today?: No  All above HIPAA information was verified with patient.  Shipping Address: [address]   [place] [address]   Insurance changes? No  ??  Completed refill call assessment today to schedule patient's medication shipment from the [facility] [phone]).    ??  Confirmed the medication and dosage are correct and have not changed: Yes, regimen is correct and unchanged.  ??  Confirmed patient started or stopped the following medications in the past month:  No, there are no changes reported at this time.  ??  Are you tolerating your medication?:  Margaret Berger reports tolerating the medication.  ??  ADHERENCE  ??  Did you miss any doses in the past 4 weeks? No missed doses reported.  ??  FINANCIAL/SHIPPING  ??  Delivery Scheduled: Yes, Expected medication delivery date: 02/06/17   ??  Margaret Berger did not have any additional questions at this time.  ??  Delivery address validated in [facility] scheduling system: Yes, address listed in [facility] is correct.  ??  We will follow up with patient monthly for standard refill processing and delivery.    ??  Margaret Berger, PharmD Candidate    Silas Flood. Meta Hatchet, PharmD, [facility]  [phone]

## 2017-02-05 MED FILL — MYCOPHENOLATE MOFETIL/500MG/TABS: MYCOPHENOLATE MOFETIL/500MG/TABS | 30 days supply | Qty: 120 | Fill #2

## 2017-02-16 ENCOUNTER — Ambulatory Visit
Admission: RE | Admit: 2017-02-16 | Discharge: 2017-02-16 | Disposition: A | Discharge status: Home / Self Care | Payer: BC Managed Care – PPO

## 2017-02-16 DIAGNOSIS — K754 Autoimmune hepatitis: Secondary | ICD-10-CM | POA: Diagnosis not present

## 2017-02-16 LAB — CBC W/ AUTO DIFF
BASOPHILS ABSOLUTE COUNT: 0 10*9/L (ref 0.0–0.1)
HEMATOCRIT: 41.8 % (ref 36.0–46.0)
HEMOGLOBIN: 13.7 g/dL (ref 12.0–16.0)
LYMPHOCYTES ABSOLUTE COUNT: 1.2 10*9/L — ABNORMAL LOW (ref 1.5–5.0)
MEAN CORPUSCULAR HEMOGLOBIN: 32.4 pg (ref 26.0–34.0)
MEAN CORPUSCULAR VOLUME: 98.7 fL (ref 80.0–100.0)
MEAN PLATELET VOLUME: 8.3 fL (ref 7.0–10.0)
MONOCYTES ABSOLUTE COUNT: 0.5 10*9/L (ref 0.2–0.8)
NEUTROPHILS ABSOLUTE COUNT: 8.2 10*9/L — ABNORMAL HIGH (ref 2.0–7.5)
PLATELET COUNT: 223 10*9/L (ref 150–440)
RED BLOOD CELL COUNT: 4.23 10*12/L (ref 4.00–5.20)
RED CELL DISTRIBUTION WIDTH: 13.7 % (ref 12.0–15.0)
WBC ADJUSTED: 10.2 10*9/L (ref 4.5–11.0)

## 2017-02-16 LAB — COMPREHENSIVE METABOLIC PANEL
ALBUMIN: 4.2 g/dL (ref 3.5–5.0)
ALKALINE PHOSPHATASE: 60 U/L (ref 38–126)
ALT (SGPT): 54 U/L — ABNORMAL HIGH (ref 15–48)
ANION GAP: 7 mmol/L — ABNORMAL LOW (ref 9–15)
AST (SGOT): 54 U/L — ABNORMAL HIGH (ref 14–38)
BILIRUBIN TOTAL: 1 mg/dL (ref 0.0–1.2)
BLOOD UREA NITROGEN: 13 mg/dL (ref 7–21)
BUN / CREAT RATIO: 19
CALCIUM: 9.5 mg/dL (ref 8.5–10.2)
CHLORIDE: 103 mmol/L (ref 98–107)
CREATININE: 0.68 mg/dL (ref 0.60–1.00)
EGFR MDRD AF AMER: >=60 mL/min/{1.73_m2} (ref >=60)
EGFR MDRD NON AF AMER: >=60 mL/min/{1.73_m2} (ref >=60)
GLUCOSE RANDOM: 114 mg/dL (ref 65–179)
PROTEIN TOTAL: 6.8 g/dL (ref 6.5–8.3)
SODIUM: 138 mmol/L (ref 135–145)

## 2017-02-16 LAB — PROTIME-INR: INR: 0.99

## 2017-02-16 LAB — EGFR MDRD NON AF AMER: Glomerular filtration rate/1.73 sq M.predicted.non black:ArVRat:Pt:Ser/Plas/Bld:Qn:Creatinine-based formula (MDRD): >=60

## 2017-02-16 LAB — PROTIME: Lab: 11.3

## 2017-02-16 LAB — BILIRUBIN DIRECT: Bilirubin.glucuronidated:MCnc:Pt:Ser/Plas:Qn:: 0.3

## 2017-02-16 LAB — LYMPHOCYTES ABSOLUTE COUNT: Lab: 1.2 — ABNORMAL LOW

## 2017-03-02 NOTE — Unmapped (Signed)
[facility] Refill Coordination Note  Specialty Medication(s): MYCOPHENOLATE 500  Additional Medications shipped:  N/A  ??  Margaret Berger, DOB: [birth year]  Phone: [phone] (home) , Alternate phone contact: N/A  Phone or address changes today?: No  All above HIPAA information was verified with patient.  Shipping Address: [address]   [place] [address]??  Insurance changes? No  ??  Completed refill call assessment today to schedule patient's medication shipment from the [facility] [phone]). ??  ??  Confirmed the medication and dosage are correct and have not changed: Yes, regimen is correct and unchanged.  ??  Confirmed patient started or stopped the following medications in the past month:  No, there are no changes reported at this time.  ??  Are you tolerating your medication?:  Margaret Berger reports tolerating the medication.  ??  ADHERENCE  ??  Did you miss any doses in the past 4 weeks? No missed doses reported.  ??  FINANCIAL/SHIPPING  ??  Delivery Scheduled: Yes, Expected medication delivery date: 03/05/17??  ??  Concha did not have any additional questions at this time.  ??  Delivery address validated in [facility] scheduling system: Yes, address listed in [facility] is correct.  ??  We will follow up with patient monthly for standard refill processing and delivery. ??  ??    Casimer Bilis, PharmD Candidate  March 02, 2017 1:18 PM

## 2017-03-04 MED FILL — MYCOPHENOLATE MOFETIL/500MG/TABS: MYCOPHENOLATE MOFETIL/500MG/TABS | 30 days supply | Qty: 120 | Fill #3

## 2017-03-06 DIAGNOSIS — Z79899 Other long term (current) drug therapy: Secondary | ICD-10-CM | POA: Diagnosis not present

## 2017-03-06 DIAGNOSIS — R7989 Other specified abnormal findings of blood chemistry: Secondary | ICD-10-CM | POA: Diagnosis not present

## 2017-03-07 LAB — COMPREHENSIVE METABOLIC PANEL
A/G RATIO: 2.1 (ref 1.2–2.2)
ALBUMIN: 4.9 g/dL (ref 3.5–5.5)
ALKALINE PHOSPHATASE: 61 IU/L (ref 39–117)
ALT (SGPT): 46 IU/L — ABNORMAL HIGH (ref 0–32)
AST (SGOT): 52 IU/L — ABNORMAL HIGH (ref 0–40)
BLOOD UREA NITROGEN: 12 mg/dL (ref 6–24)
BUN / CREAT RATIO: 14 (ref 9–23)
CALCIUM: 10.1 mg/dL (ref 8.7–10.2)
CHLORIDE: 100 mmol/L (ref 96–106)
CO2: 24 mmol/L (ref 20–29)
GFR MDRD AF AMER: 86 mL/min/{1.73_m2}
GFR MDRD NON AF AMER: 75 mL/min/{1.73_m2}
GLOBULIN, TOTAL: 2.3 g/dL (ref 1.5–4.5)
SODIUM: 142 mmol/L (ref 134–144)
TOTAL PROTEIN: 7.2 g/dL (ref 6.0–8.5)

## 2017-03-07 LAB — CBC W/ DIFFERENTIAL
BANDED NEUTROPHILS ABSOLUTE COUNT: 0 10*3/uL (ref 0.0–0.1)
BASOPHILS ABSOLUTE COUNT: 0 10*3/uL (ref 0.0–0.2)
BASOPHILS RELATIVE PERCENT: 0 %
EOSINOPHILS ABSOLUTE COUNT: 0.1 10*3/uL (ref 0.0–0.4)
EOSINOPHILS RELATIVE PERCENT: 1 %
HEMATOCRIT: 44.8 % (ref 34.0–46.6)
IMMATURE GRANULOCYTES: 0 %
LYMPHOCYTES ABSOLUTE COUNT: 1.3 10*3/uL (ref 0.7–3.1)
LYMPHOCYTES RELATIVE PERCENT: 13 %
MEAN CORPUSCULAR HEMOGLOBIN CONC: 33.3 g/dL (ref 31.5–35.7)
MEAN CORPUSCULAR HEMOGLOBIN: 31.9 pg (ref 26.6–33.0)
MEAN CORPUSCULAR VOLUME: 96 fL (ref 79–97)
MONOCYTES ABSOLUTE COUNT: 0.4 10*3/uL (ref 0.1–0.9)
MONOCYTES RELATIVE PERCENT: 4 %
NEUTROPHILS RELATIVE PERCENT: 82 %
RED BLOOD CELL COUNT: 4.67 x10E6/uL (ref 3.77–5.28)
RED CELL DISTRIBUTION WIDTH: 13.6 % (ref 12.3–15.4)
WHITE BLOOD CELL COUNT: 10.2 10*3/uL (ref 3.4–10.8)

## 2017-03-07 LAB — BILIRUBIN DIRECT: Lab: 0.21

## 2017-03-07 LAB — PROTHROMBIN TIME: Lab: 10.9

## 2017-03-07 LAB — GAMMA GLUTAMYL TRANSFERASE: Lab: 133 — ABNORMAL HIGH

## 2017-03-07 LAB — ALKALINE PHOSPHATASE: Lab: 61

## 2017-03-07 LAB — MONOCYTES ABSOLUTE COUNT: Lab: 0.4

## 2017-03-10 NOTE — Unmapped (Signed)
Spoke to patient who feels well. Fewer eye symptoms associated with decreased prednisone. Currently on Pred 10mg  daily + CellCept 1gram bid.  Recent LFTs from 8/24 similar to prior with mildly abnormal ALT.  Will continue tot taper prednisone to see lowest dose at which we can maintain current status prior to trying to add additional immunosuppression. Labs in 2 weeks and office visit in 4 weeks.     [name], M.D.  Professor of Medicine  Director, [facility]  [place] [facility] [place] [facility] [place]    [phone]

## 2017-03-18 ENCOUNTER — Ambulatory Visit
Admission: RE | Admit: 2017-03-18 | Discharge: 2017-03-18 | Payer: BC Managed Care – PPO | Attending: Student in an Organized Health Care Education/Training Program | Admitting: Student in an Organized Health Care Education/Training Program

## 2017-03-18 DIAGNOSIS — H04123 Dry eye syndrome of bilateral lacrimal glands: Secondary | ICD-10-CM | POA: Diagnosis not present

## 2017-03-18 DIAGNOSIS — H2513 Age-related nuclear cataract, bilateral: Secondary | ICD-10-CM | POA: Diagnosis not present

## 2017-03-18 DIAGNOSIS — H4423 Degenerative myopia, bilateral: Secondary | ICD-10-CM | POA: Diagnosis not present

## 2017-03-18 DIAGNOSIS — H40053 Ocular hypertension, bilateral: Secondary | ICD-10-CM | POA: Diagnosis not present

## 2017-03-18 DIAGNOSIS — H11433 Conjunctival hyperemia, bilateral: Principal | ICD-10-CM

## 2017-03-18 NOTE — Unmapped (Signed)
Patient is a 57 y.o. woman with PMH of autoimmune hepatitis on oral steroids who presents for ocular HTN eval    1. Glaucoma evaluation  -- Age: 56 y.o.  -- Race: white  -- Family history: denies  -- Trauma: denies  -- Refraction:  Wearing Rx      Sphere Cylinder Axis Add   Right -8.00 +3.75 088 +2.00   Left -8.25 +4.00 095 +2.00   Type: Progressive   -- Medical/Medications: oral prednisone now on 7.5mg  po daily (previously was 50mg  daily, tapering slowly since February 2018)  -- Treatment history: n/a   - Glaucoma rx: n/a  -- Color plates:   -- TMax: 22/24  -- IOP: 22/22  -- CCT: 575/571  -- Gonioscopy:    - OD: CBB visible iris vessels but no NV   - OS: CBB visible iris vessels but no NV  -- Optic Nerves:    - OD: .15   - OS: .15  -- OCT RNFL:    - AT 88 (wnl) , all wnl, DA 1.43   - AT 90 (wnl), sup bdl, temp thick,  DA 1.18  -- HVF:   FDT - FULL OU  -- Impression:  56 yo WF with autoimmune hepatitis with borderline ocualr HTN, possible in setting of oral prednisone, normal c/d, now tapering po prednisone  -- Plan:  - IOP check in 6 months, sooner PRN    2. Cataracts, OU  - mild, presurgical    3. Dry Eyes, OU  - using artificial tears and ung qhs    4. High myopia, OU   - monitoring    5. Extramacular drusen OD  -- benign  -- observe  I saw and evaluated the patient, participating in the key portions of the service.  I reviewed the resident???s note.  I agree with the resident???s findings and plan. Arville Care, MD

## 2017-03-31 ENCOUNTER — Ambulatory Visit
Admission: RE | Admit: 2017-03-31 | Discharge: 2017-03-31 | Disposition: A | Discharge status: Home / Self Care | Payer: BC Managed Care – PPO

## 2017-03-31 DIAGNOSIS — K754 Autoimmune hepatitis: Secondary | ICD-10-CM | POA: Diagnosis not present

## 2017-03-31 DIAGNOSIS — Z5181 Encounter for therapeutic drug level monitoring: Secondary | ICD-10-CM | POA: Diagnosis not present

## 2017-03-31 LAB — COMPREHENSIVE METABOLIC PANEL
ALBUMIN: 4.3 g/dL (ref 3.5–5.0)
ALKALINE PHOSPHATASE: 57 U/L (ref 38–126)
ALT (SGPT): 55 U/L — ABNORMAL HIGH (ref 15–48)
ANION GAP: 9 mmol/L (ref 9–15)
AST (SGOT): 56 U/L — ABNORMAL HIGH (ref 14–38)
BILIRUBIN TOTAL: 0.6 mg/dL (ref 0.0–1.2)
BLOOD UREA NITROGEN: 14 mg/dL (ref 7–21)
CALCIUM: 9.8 mg/dL (ref 8.5–10.2)
CHLORIDE: 103 mmol/L (ref 98–107)
CO2: 28 mmol/L (ref 22.0–30.0)
CREATININE: 0.69 mg/dL (ref 0.60–1.00)
EGFR MDRD AF AMER: >=60 mL/min/{1.73_m2} (ref >=60)
EGFR MDRD NON AF AMER: >=60 mL/min/{1.73_m2} (ref >=60)
GLUCOSE RANDOM: 114 mg/dL (ref 65–179)
POTASSIUM: 4.7 mmol/L (ref 3.5–5.0)
PROTEIN TOTAL: 7 g/dL (ref 6.5–8.3)
SODIUM: 140 mmol/L (ref 135–145)

## 2017-03-31 LAB — PROTIME-INR: PROTIME: 11.1 s (ref 10.2–12.8)

## 2017-03-31 LAB — CBC W/ AUTO DIFF
BASOPHILS ABSOLUTE COUNT: 0 10*9/L (ref 0.0–0.1)
EOSINOPHILS ABSOLUTE COUNT: 0 10*9/L (ref 0.0–0.4)
HEMOGLOBIN: 13.7 g/dL (ref 12.0–16.0)
LARGE UNSTAINED CELLS: 1 % (ref 0–4)
LYMPHOCYTES ABSOLUTE COUNT: 1 10*9/L — ABNORMAL LOW (ref 1.5–5.0)
MEAN CORPUSCULAR HEMOGLOBIN CONC: 32.7 g/dL (ref 31.0–37.0)
MEAN CORPUSCULAR HEMOGLOBIN: 32.1 pg (ref 26.0–34.0)
MEAN CORPUSCULAR VOLUME: 98.3 fL (ref 80.0–100.0)
MEAN PLATELET VOLUME: 9.7 fL (ref 7.0–10.0)
MONOCYTES ABSOLUTE COUNT: 0.3 10*9/L (ref 0.2–0.8)
NEUTROPHILS ABSOLUTE COUNT: 5 10*9/L (ref 2.0–7.5)
RED BLOOD CELL COUNT: 4.27 10*12/L (ref 4.00–5.20)
RED CELL DISTRIBUTION WIDTH: 13.8 % (ref 12.0–15.0)
WBC ADJUSTED: 6.5 10*9/L (ref 4.5–11.0)

## 2017-03-31 LAB — GAMMA GLUTAMYL TRANSFERASE: Gamma glutamyl transferase:CCnc:Pt:Ser/Plas:Qn:: 106 — ABNORMAL HIGH

## 2017-03-31 LAB — BILIRUBIN DIRECT: Bilirubin.glucuronidated:MCnc:Pt:Ser/Plas:Qn:: 0.1

## 2017-03-31 LAB — RED CELL DISTRIBUTION WIDTH: Lab: 13.8

## 2017-03-31 LAB — PROTIME: Lab: 11.1

## 2017-03-31 LAB — BLOOD UREA NITROGEN: Urea nitrogen:MCnc:Pt:Ser/Plas:Qn:: 14

## 2017-03-31 NOTE — Unmapped (Signed)
Specialty Pharmacy - Hepatitis C Medication Refill Coordination      Margaret Berger is a 56 y.o. female contacted today regarding refills of her specialty medication(s).MYCOPHENOLATE     Treatment start date: NA   Treatment duration: NA    Current treatment week: NA    Reviewed and verified with patient:      Specialty medication(s) and dose(s) confirmed: yes  Changes to medications: no  Changes to insurance: no    Medication Adherence    Patient Reported X Missed Doses in the Last Month:  0  Specialty Medication:  MYCOPHENOLATE QOH *NOT AT HOME*   Patient is on additional specialty medications:  No  Demonstrates Understanding of Importance of Adherence:  Yes  Informant:  patient  Confirmed Plan for Next Specialty Medication Refill:  delivery by pharmacy  Medication Assistance Program  Refill Coordination  Has the Patients' Contact Information Changed:  No    Is the Shipping Address Different:  Yes  Updated Shipping Address:  [address] [place] [address]   Shipping Information  Delivery Scheduled:  Yes  Delivery Date:  04/03/17  Medications to be Shipped:  MYCOPHENOLATE          Drug Interactions    Clinically relevant drug interactions identified:  no                 Follow-up: 28 day(s)  Does Margaret Berger have follow up appointment scheduled with clinic? Yes, appointment is scheduled and patient is aware    Jolene Schimke  Specialty Pharmacy Technician

## 2017-04-02 MED FILL — MYCOPHENOLATE MOFETIL/500MG/TABS: MYCOPHENOLATE MOFETIL/500MG/TABS | 30 days supply | Qty: 120 | Fill #4

## 2017-04-09 NOTE — Unmapped (Signed)
CHANGING TO RX GENERAL SPECIALTY QUE

## 2017-04-15 ENCOUNTER — Ambulatory Visit
Admission: RE | Admit: 2017-04-15 | Discharge: 2017-04-15 | Disposition: A | Discharge status: Home / Self Care | Payer: BC Managed Care – PPO | Attending: Gastroenterology | Admitting: Gastroenterology

## 2017-04-15 DIAGNOSIS — K754 Autoimmune hepatitis: Secondary | ICD-10-CM | POA: Diagnosis not present

## 2017-04-15 DIAGNOSIS — Z6824 Body mass index (BMI) 24.0-24.9, adult: Secondary | ICD-10-CM | POA: Diagnosis not present

## 2017-04-15 NOTE — Unmapped (Signed)
[facility]    [name], M.D.  Professor of Medicine  Director, [facility]  [place] of [place] at [place]    [phone]    [name]    Referred Self  No address on file     Referring MD: Midge Minium, MD    Chief complaint: Office follow-up for elevation of liver enzymes. Hospitalization (08/2016)for acute hepatitis of uncertain etiology, presumed autoimmune hepatitis treated with prednisone and mycophemylate. DRUG FEVER ASSOCIATED WITH AZATHIOPRINE    Present illness:  Patient is a 56 y.o. Caucasian female who was hospitalized at [facility] between approximately February 12 -  August 29, 2016 with acute hepatitis. The patient had about 10 weeks of abnormal liver tests documented by her primary physician and her primary gastroenterologist. This was preceded by a short flu-like illness characterized by fatigue, muscle aches, and low-grade fever. All of the symptoms have completely remained resolved since her  Visit 09/10/2016. However she was left with elevated liver tests and had an extensive workup as an outpatient and subsequently as an inpatient at [facility] to further define the etiology of her liver disease.  Additional details of her history and prior outpatient evaluation as well as her inpatient evaluation are available from our initial consultation note and follow-up notes while she was hospitalized.       After review of the liver biopsy and all serologic data thus far which has been unrevealing, it was felt that a therapeutic trial of prednisone for presumed seronegative autoimmune hepatitis was warranted. Prednisone  40 mg of daily started on 08/29/16. She had a good biochemical response with marked improvement in ALT, INR, including a slow but steady decrease in Total/direct bilirubin.     Patient had an extensive serological evaluation which was not revealing:  1. Antinuclear antibody and anti-smooth muscle antibody were negative. Immunoglobulin G was not elevated (1061)  2. Viral serologies including acute hepatitis A, hepatitis B, hepatitis C, HCV RNA, EBV PCR, CMV PCR, HHV-6 were all negative.  3. Liver ultrasound demonstrated patent vasculature with a nodular-appearing heterogeneous liver.  4. Percutaneous liver biopsy 08/2016 at [facility]:    A: Liver, core biopsy   - Severe active hepatitis with confluent areas of periportal hepatocellular necrosis (approximately 10-20% of sampled parenchyma) (see comment)   - Trichrome stain demonstrates at least periportal fibrosis and is suspicious for bridging fibrosis     Potential etiologies to consider include, but are not limited to, infection, drug/toxin-induced liver injury, seronegative autoimmune hepatitis, and metabolic disorders. The trichrome stain findings suggest that this process is at least subacute in duration.      Liver biopsy slides were personally reviewed by Dr. Sharon Mt  with the pathologist.  No viral inclusions were noted and only few plasma cells were visualized in the liver biopsy which was an adequate specimen. No cytopathic effect. EBV immunostains were negative. Fe in hepatocytes only grade 1/4    5.  Evaluation for Zoster: Dr. Foy Guadalajara received phone call from patient's primary GI MD that patient's Zoster IgM from 09/05/2016 was positive with titer 1.17 approximately one week after starting prednisone. Review of her record though revealed this test to have been negative on 08/29/2016.  Dr. Sharon Mt discussed these findings with Dr. Lytle Butte and the decision was made to proceed with patient starting Valtrex 500 mg bid.  She was seen by Dr. 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   Recommendations from ID: Continue valacyclovir 500 mg bid.  Advised readdressing stopping Valtrex if her repeat IgM is negative or once she is on <15 mg prednisone daily.  Valtrex was discontinued last week.  Bactrim prophylaxis while prednisone >15 mg q 24. 1 DS tablet M/W/F. The following vaccines were recommended for her to obtain under the care of her PCP: Twinrix, PCV13, and PPSV23. She should have these performed by her PCP when prednisone dose is  <20 mg daily.     Immunosuppressive history:  Patient was started on azathioprine for steroid sparing benefits in early April.  After 2 weeks patient developed fevers. ID workup negative during hospitalization.  Azathioprine was held during that hospitalization and fevers resolved. When she restarted azathioprine as outpatient, immediately had high fevers and shaking chills.  This rechallenge was highly suggestive of drug fever from azathioprine.    On 5/12 patient was started on CellCept 500mg  bid and continued on prednisone 15 mg daily.  On 12/03/16, liver enzymes remained elevated. Cellcept was increased to 1000mg  BID on 12/04/16 and prednisone continued at 15 mg day which she continues as of today (01/07/17).     Interval history:Patient states she feels the best she's felt in a year.  Has great energy. She exercises daily. She denies fever, cough, sob, N/V, abdominal pain, chest pain, or skin rash. Eye symptoms have improved and she has followed up with ophthalmology twice since her initial visit.      10 sys ROS otherwise negative.     Past medical history:  Patient has generally been healthy until the current hepatitis began in December 2017.  1. No history of diabetes, coronary artery disease, hypertension, or lung disease.  2. No history of other autoimmune systemic diseases  3. FIBROSCAN 11/05/16: 17.7 kps c/w stage F4 (Patient ate breakfast 3 hours prior).  4. MRI 10/2016: Stable ill-defined geographic area of T1 hypointensity, with associated persistent enhancement in the posterior right hepatic lobe, possibly related to abnormal perfusion in the setting of underlying hepatitis. ??Continued attention on follow-up is recommended. Reviewed in HB conference: changes suggestive of underlying advanced fibrosis/cirrhosis.   -- Cirrhotic liver. ??No foci of early arterial hyperenhancement or washout to suggest HCC.  -- Small caliber paraumbilical vein suggestive of portal hypertension.  -- Trace perihepatic and perisplenic ascites.  -- Gallbladder borderline hydropic, unchanged.    Allergies   Allergen Reactions   ??? Azathioprine Other (See Comments)     Fever       Current Outpatient Prescriptions   Medication Sig Dispense Refill   ??? mycophenolate (CELLCEPT) 500 mg tablet Take 2 tablets (1,000 mg total) by mouth Two (2) times a day. Take with food. 120 tablet 5   ??? predniSONE (DELTASONE) 10 MG tablet Take 1.5 tablets (15 mg total) by mouth daily. (Patient taking differently: Take 7.5 mg by mouth daily. ) 45 tablet 6   ??? ketoconazole (NIZORAL) 2 % cream Apply 1 application topically Two (2) times a day. To affected areas as directed (Patient not taking: Reported on 04/15/2017) 60 g 1     No current facility-administered medications for this visit.      Social history: The patient is married. She works as a Materials engineer. They have traveled extensively although none for the last year or so, until this recently planned trip. She does not drink any alcohol. She does not smoke cigarettes.    Family history: Negative for liver disease or liver cancer. Negative for autoimmune systemic  diseases.    Physical Examination:    BP 140/81  - Pulse 64  - Temp 36.6 ??C (97.9 ??F)  - Resp 20  - Ht 168.9 cm (5' 6.5)  - Wt 70.8 kg (156 lb)  - SpO2 100%  - BMI 24.80 kg/m??   General: Extremely Pleasant, WD, WN Caucasian female. NAD noted, round facies secondary to prednisone  HEENT: Sclera anicteric, mildly injected,  no temporal muscle loss, oropharynx is negative  NECK: No thyromegaly or lymphadenopathy, No carotid bruits  Chest: Clear to auscultation and percussion  Heart: S1, S2, RR, No murmurs  Abdomen: Soft, non-tender, non-distended, no hepatosplenomegaly, no masses appreciated, no ascites  Skin: No spider angiomata, No rashes  Extremities: Without pedal edema, + palmar erythema  Neuro: Grossly intact, No focal deficits, no asterixis.    Laboratory Studies:     Results for orders placed or performed in visit on 03/31/17   Comprehensive metabolic panel   Result Value Ref Range    Sodium 140 135 - 145 mmol/L    Potassium 4.7 3.5 - 5.0 mmol/L    Chloride 103 98 - 107 mmol/L    CO2 28.0 22.0 - 30.0 mmol/L    BUN 14 7 - 21 mg/dL    Creatinine 1.61 0.96 - 1.00 mg/dL    BUN/Creatinine Ratio 20     EGFR MDRD Non Af Amer >=60 >=60 mL/min/1.96m2    EGFR MDRD Af Amer >=60 >=60 mL/min/1.31m2    Anion Gap 9 9 - 15 mmol/L    Glucose 114 65 - 179 mg/dL    Calcium 9.8 8.5 - 04.5 mg/dL    Albumin 4.3 3.5 - 5.0 g/dL    Total Protein 7.0 6.5 - 8.3 g/dL    Total Bilirubin 0.6 0.0 - 1.2 mg/dL    AST 56 (H) 14 - 38 U/L    ALT 55 (H) 15 - 48 U/L    Alkaline Phosphatase 57 38 - 126 U/L   Bilirubin, Direct   Result Value Ref Range    Bilirubin, Direct 0.10 0.00 - 0.40 mg/dL   Gamma GT (GGT)   Result Value Ref Range    GGT 106 (H) 11 - 48 U/L   PT-INR   Result Value Ref Range    PT 11.1 10.2 - 12.8 sec    INR 0.97    CBC w/ Differential   Result Value Ref Range    WBC 6.5 4.5 - 11.0 10*9/L    RBC 4.27 4.00 - 5.20 10*12/L    HGB 13.7 12.0 - 16.0 g/dL    HCT 40.9 81.1 - 91.4 %    MCV 98.3 80.0 - 100.0 fL    MCH 32.1 26.0 - 34.0 pg    MCHC 32.7 31.0 - 37.0 g/dL    RDW 78.2 95.6 - 21.3 %    MPV 9.7 7.0 - 10.0 fL    Platelet 198 150 - 440 10*9/L    Absolute Neutrophils 5.0 2.0 - 7.5 10*9/L    Absolute Lymphocytes 1.0 (L) 1.5 - 5.0 10*9/L    Absolute Monocytes 0.3 0.2 - 0.8 10*9/L    Absolute Eosinophils 0.0 0.0 - 0.4 10*9/L    Absolute Basophils 0.0 0.0 - 0.1 10*9/L    Large Unstained Cells 1 0 - 4 %    Macrocytosis Slight (A) Not Present       Impression:  1. Acute hepatitis likely seronegative autoimmune hepatitis based on response to immunosuppressive medication. The patient presented with severe hepatitis  as evidenced by hyperbilirubinemia, elevation of her INR, decreased albumin, and results from her liver biopsy. Patient was started on a therapeutic trial of prednisone for the reasons discussed above. After only 4 days of immunosuppression, her liver enzymes (ALT)  that had been regularly elevated above 1000 decreased substantially.  This was highly suggestive of a positive effect of the prednisone and was c/w  presumed ANA-negative/SMA-negative autoimmune hepatitis. Recent labs showed continued normalization of bilirubin and INR, although she remained with mildly elevated ALT in the 50-60 range. CellCept was added on 5/12 and increased to 1000mg  BID on 12/04/16.Steroids continue to be tapered from 7.5 mg daily to 5.0 mg daily.  We will maintain current level of CellCept to see if steroids can be tapered to off without further flaring of ALT. Clinical response will determine whether to add additional immunosuppression in effort to fully normalize ALT.      2. Possible cirrhosis:  FIBROSCAN suggestive of cirrhosis.  Liver biopsy during acute episode did demonstrate possible bridging fibrosis. Inflammation now resolved. MRI from 10/2016 also suggestive of underlying cirrhosis.  May consider repeat liver biopsy after prolonged remission to reevaluate. Repeat MRI in next 1-2 months.      2. Drug fevers from Azathioprine: resolved, no recurrent fever.    3. Mild thrombocytopenia: monitor for progression of fibrosis or portal HTN seem less likely in light of acute nature of current liver disease. Normal spleen on prior imaging.     4. Twinrix # 1 administered last visit. Twinrix # 2 due in beginning of July. Third TWINRIX at next visit.     5. Eye symptoms: Felt to be steroid-related,  Now resolved as steroid dose decreased.      RTC 12 weeks with interval labs to assess immunosuppression response.   Patient will notify us of any change in clinical status or concerns re infection.        [name], M.D.  Professor of Medicine  Director, [facility]  [place] of [place] at [place]    [phone]

## 2017-04-15 NOTE — Unmapped (Addendum)
Glad to hear you are feeling so well. Blood tests have been stable as we taper the prednisone. Decrease prednisone to 5 mg daily and repeat blood tests in 2 weeks. We will continue to taper the prednisone until we see further increases in liver enzymes, if so, we will add other type of immunosupression as needed. Return to office in 3 months.

## 2017-04-29 NOTE — Unmapped (Signed)
[facility] Specialty Pharmacy Refill and Clinical Coordination Note  Medication(s): Mycophenolate 500mg     [name], DOB: 09-25-1960  Phone: [phone] (home) , Alternate phone contact: N/A  Shipping address: [id number]  [place]  [address]  Phone or address changes today?: No  All above HIPAA information verified.  Insurance changes? No    Completed refill and clinical call assessment today to schedule patient's medication shipment from the [facility] [phone]).      MEDICATION RECONCILIATION    Confirmed the medication and dosage are correct and have not changed: Yes, regimen is correct and unchanged.    Were there any changes to your medication(s) in the past month:  No, there are no changes reported at this time.    ADHERENCE    Is this medicine transplant or covered by Medicare Part B? No.        Did you miss any doses in the past 4 weeks? No missed doses reported.  Adherence counseling provided? Not needed     SIDE EFFECT MANAGEMENT    Are you tolerating your medication?:  Masiyah reports tolerating the medication.  Side effect management discussed: None      Therapy is appropriate and should be continued.      FINANCIAL/SHIPPING    Delivery Scheduled: Yes, Expected medication delivery date: 05/01/17   Additional medications refilled: No additional medications/refills needed at this time.    Kimberli did not have any additional questions at this time.    Delivery address validated in [facility]SI scheduling system: Yes, address listed above is correct.      We will follow up with patient monthly for standard refill processing and delivery.      Thank you,  Rollen Sox   [facility] Specialty Pharmacist

## 2017-05-05 ENCOUNTER — Ambulatory Visit
Admission: RE | Admit: 2017-05-05 | Discharge: 2017-05-05 | Disposition: A | Discharge status: Home / Self Care | Payer: BC Managed Care – PPO

## 2017-05-05 DIAGNOSIS — K754 Autoimmune hepatitis: Secondary | ICD-10-CM | POA: Diagnosis not present

## 2017-05-05 DIAGNOSIS — K769 Liver disease, unspecified: Secondary | ICD-10-CM | POA: Diagnosis not present

## 2017-05-05 DIAGNOSIS — K7460 Unspecified cirrhosis of liver: Secondary | ICD-10-CM | POA: Diagnosis not present

## 2017-05-05 LAB — PROTIME: Lab: 11.1

## 2017-05-05 LAB — EGFR MDRD NON AF AMER: Glomerular filtration rate/1.73 sq M.predicted.non black:ArVRat:Pt:Ser/Plas/Bld:Qn:Creatinine-based formula (MDRD): >=60

## 2017-05-05 LAB — COMPREHENSIVE METABOLIC PANEL
ALBUMIN: 4.2 g/dL (ref 3.5–5.0)
ALKALINE PHOSPHATASE: 49 U/L (ref 38–126)
ALT (SGPT): 51 U/L — ABNORMAL HIGH (ref 15–48)
ANION GAP: 9 mmol/L (ref 9–15)
AST (SGOT): 57 U/L — ABNORMAL HIGH (ref 14–38)
BILIRUBIN TOTAL: 1.1 mg/dL (ref 0.0–1.2)
BLOOD UREA NITROGEN: 15 mg/dL (ref 7–21)
BUN / CREAT RATIO: 23
CALCIUM: 9.5 mg/dL (ref 8.5–10.2)
CO2: 28 mmol/L (ref 22.0–30.0)
CREATININE: 0.66 mg/dL (ref 0.60–1.00)
EGFR MDRD AF AMER: >=60 mL/min/{1.73_m2} (ref >=60)
GLUCOSE RANDOM: 87 mg/dL (ref 65–99)
POTASSIUM: 4.8 mmol/L (ref 3.5–5.0)
PROTEIN TOTAL: 6.9 g/dL (ref 6.5–8.3)
SODIUM: 139 mmol/L (ref 135–145)

## 2017-05-05 LAB — CBC W/ AUTO DIFF
BASOPHILS ABSOLUTE COUNT: 0 10*9/L (ref 0.0–0.1)
EOSINOPHILS ABSOLUTE COUNT: 0.2 10*9/L (ref 0.0–0.4)
HEMATOCRIT: 40.3 % (ref 36.0–46.0)
LYMPHOCYTES ABSOLUTE COUNT: 1.9 10*9/L (ref 1.5–5.0)
MEAN CORPUSCULAR HEMOGLOBIN CONC: 32.5 g/dL (ref 31.0–37.0)
MEAN CORPUSCULAR HEMOGLOBIN: 31.8 pg (ref 26.0–34.0)
MEAN CORPUSCULAR VOLUME: 97.9 fL (ref 80.0–100.0)
MEAN PLATELET VOLUME: 9.4 fL (ref 7.0–10.0)
MONOCYTES ABSOLUTE COUNT: 0.3 10*9/L (ref 0.2–0.8)
NEUTROPHILS ABSOLUTE COUNT: 2.9 10*9/L (ref 2.0–7.5)
PLATELET COUNT: 186 10*9/L (ref 150–440)
RED BLOOD CELL COUNT: 4.11 10*12/L (ref 4.00–5.20)
RED CELL DISTRIBUTION WIDTH: 13.8 % (ref 12.0–15.0)

## 2017-05-05 LAB — LYMPHOCYTES ABSOLUTE COUNT: Lab: 1.9

## 2017-05-05 LAB — PROTIME-INR: PROTIME: 11.1 s (ref 10.2–12.8)

## 2017-05-05 MED FILL — MYCOPHENOLATE MOFETIL/500MG/TABS: MYCOPHENOLATE MOFETIL/500MG/TABS | 30 days supply | Qty: 120 | Fill #5

## 2017-05-06 LAB — ANTINUCLEAR ANTIBODIES (ANA): Lab: NEGATIVE

## 2017-05-07 LAB — SMOOTH MUSCLE ANTIBODY: Smooth muscle Ab:PrThr:Pt:Ser:Ord:IF: NEGATIVE

## 2017-05-08 NOTE — Unmapped (Signed)
Spoke with patient. She continues to feel well. Reviewed with her labs and MRI from 10/23. ALT minimally elevated at 51 which has been stable as we have tapered the prednisone. She remains on Cellcept 1 gram BID.  Will continue to taper steroids down to prednisone 2.5 mg daily. Repeat labs in about 4 weeks. MRI mildly nodular contour, no masses.     Margaret Berger, M.D.  Professor of Medicine  Director, [facility]  [place] of [place] at [place]    [phone]

## 2017-05-21 NOTE — Unmapped (Signed)
11/8: Patient states she has well over 2 weeks med left and will call us when she needs it. I will re-set call up for 2 weeks out as well.

## 2017-06-01 NOTE — Unmapped (Signed)
[facility] Specialty Pharmacy Refill and Clinical Coordination Note  Medication(s): Mycophenolate 500mg     Margaret Berger, DOB: Jul 01, 1961  Phone: [phone] (home) [phone] (work), Alternate phone contact: N/A  Shipping address: [id number]  [place] [address]  Phone or address changes today?: No  All above HIPAA information verified.  Insurance changes? No    Completed refill and clinical call assessment today to schedule patient's medication shipment from the [facility] [phone]).      MEDICATION RECONCILIATION    Confirmed the medication and dosage are correct and have not changed: Yes, regimen is correct and unchanged.    Were there any changes to your medication(s) in the past month:  No, there are no changes reported at this time.    ADHERENCE    Is this medicine transplant or covered by Medicare Part B? Yes.     Mycophenolate Mofetil 500 mg   Quantity filled last month: 120   # of tablets left on hand: 20        Did you miss any doses in the past 4 weeks? No missed doses reported.  Adherence counseling provided? Not needed     SIDE EFFECT MANAGEMENT    Are you tolerating your medication?:  Margaret Berger reports tolerating the medication.  Side effect management discussed: None      Therapy is appropriate and should be continued.    Evidence of clinical benefit: See Epic note from 04/15/17      FINANCIAL/SHIPPING    Delivery Scheduled: Yes, Expected medication delivery date: 06/03/17   Additional medications refilled: No additional medications/refills needed at this time.    Margaret Berger did not have any additional questions at this time.    Delivery address validated in [facility]SI scheduling system: Yes, address listed above is correct.      We will follow up with patient monthly for standard refill processing and delivery.      Thank you,  Lupita Shutter   [facility] Specialty Pharmacist

## 2017-06-02 MED ORDER — MYCOPHENOLATE MOFETIL 500 MG TABLET
ORAL_TABLET | Freq: Two times a day (BID) | ORAL | 5 refills | 0.00000 days | Status: CP
Start: 2017-06-02 — End: 2017-11-26

## 2017-06-02 MED ORDER — MYCOPHENOLATE MOFETIL 500 MG TABLET: 1000 mg | each | 5 refills | 0 days

## 2017-06-02 MED FILL — MYCOPHENOLATE MOFETIL/500MG/TABS: MYCOPHENOLATE MOFETIL/500MG/TABS | 30 days supply | Qty: 120 | Fill #0

## 2017-06-09 ENCOUNTER — Ambulatory Visit
Admission: RE | Admit: 2017-06-09 | Discharge: 2017-06-09 | Disposition: A | Discharge status: Home / Self Care | Payer: BC Managed Care – PPO | Attending: Gastroenterology | Admitting: Gastroenterology

## 2017-06-09 DIAGNOSIS — Z23 Encounter for immunization: Secondary | ICD-10-CM | POA: Diagnosis not present

## 2017-06-09 DIAGNOSIS — K754 Autoimmune hepatitis: Secondary | ICD-10-CM | POA: Diagnosis not present

## 2017-06-09 LAB — CBC W/ AUTO DIFF
BASOPHILS ABSOLUTE COUNT: 0 10*9/L (ref 0.0–0.1)
EOSINOPHILS ABSOLUTE COUNT: 0.2 10*9/L (ref 0.0–0.4)
HEMATOCRIT: 42.6 % (ref 36.0–46.0)
HEMOGLOBIN: 13.7 g/dL (ref 12.0–16.0)
LARGE UNSTAINED CELLS: 2 % (ref 0–4)
LYMPHOCYTES ABSOLUTE COUNT: 1.5 10*9/L (ref 1.5–5.0)
MEAN CORPUSCULAR HEMOGLOBIN: 31.6 pg (ref 26.0–34.0)
MEAN CORPUSCULAR VOLUME: 98.3 fL (ref 80.0–100.0)
MEAN PLATELET VOLUME: 9.3 fL (ref 7.0–10.0)
MONOCYTES ABSOLUTE COUNT: 0.3 10*9/L (ref 0.2–0.8)
RED BLOOD CELL COUNT: 4.33 10*12/L (ref 4.00–5.20)
RED CELL DISTRIBUTION WIDTH: 13.8 % (ref 12.0–15.0)
WBC ADJUSTED: 5.4 10*9/L (ref 4.5–11.0)

## 2017-06-09 LAB — COMPREHENSIVE METABOLIC PANEL
ALBUMIN: 4.3 g/dL (ref 3.5–5.0)
ALKALINE PHOSPHATASE: 57 U/L (ref 38–126)
ALT (SGPT): 54 U/L — ABNORMAL HIGH (ref 15–48)
ANION GAP: 10 mmol/L (ref 9–15)
AST (SGOT): 50 U/L — ABNORMAL HIGH (ref 14–38)
BILIRUBIN TOTAL: 0.8 mg/dL (ref 0.0–1.2)
BLOOD UREA NITROGEN: 13 mg/dL (ref 7–21)
BUN / CREAT RATIO: 19
CHLORIDE: 103 mmol/L (ref 98–107)
CO2: 29 mmol/L (ref 22.0–30.0)
CREATININE: 0.68 mg/dL (ref 0.60–1.00)
EGFR MDRD AF AMER: >=60 mL/min/{1.73_m2} (ref >=60)
EGFR MDRD NON AF AMER: >=60 mL/min/{1.73_m2} (ref >=60)
GLUCOSE RANDOM: 87 mg/dL (ref 65–179)
POTASSIUM: 5.2 mmol/L — ABNORMAL HIGH (ref 3.5–5.0)
SODIUM: 142 mmol/L (ref 135–145)

## 2017-06-09 LAB — BILIRUBIN DIRECT: Bilirubin.glucuronidated:MCnc:Pt:Ser/Plas:Qn:: 0.2

## 2017-06-09 LAB — MEAN PLATELET VOLUME: Lab: 9.3

## 2017-06-09 LAB — EGFR MDRD NON AF AMER: Glomerular filtration rate/1.73 sq M.predicted.non black:ArVRat:Pt:Ser/Plas/Bld:Qn:Creatinine-based formula (MDRD): >=60

## 2017-06-09 LAB — INR: Lab: 0.93

## 2017-06-09 LAB — GAMMA GLUTAMYL TRANSFERASE: Gamma glutamyl transferase:CCnc:Pt:Ser/Plas:Qn:: 96 — ABNORMAL HIGH

## 2017-06-25 ENCOUNTER — Other Ambulatory Visit: Payer: Self-pay | Admitting: Internal Medicine

## 2017-06-25 DIAGNOSIS — Z1231 Encounter for screening mammogram for malignant neoplasm of breast: Secondary | ICD-10-CM

## 2017-06-26 NOTE — Unmapped (Signed)
[facility] Specialty Pharmacy Refill Coordination Note  Specialty Medication(s): Mycophenolate 500mg   Additional Medications shipped: none    [name], DOB: Nov 27, 1960  Phone: [phone] (home) [phone] (work), Alternate phone contact: N/A  Phone or address changes today?: No  All above HIPAA information was verified with patient.  Shipping Address: [id number]  [place] [address]   Insurance changes? No    Completed refill call assessment today to schedule patient's medication shipment from the [facility] [phone]).      Confirmed the medication and dosage are correct and have not changed: Yes, regimen is correct and unchanged.    Confirmed patient started or stopped the following medications in the past month:  No, there are no changes reported at this time.    Are you tolerating your medication?:  Hailey reports tolerating the medication.    ADHERENCE    Mycophenolate Mofetil 500 mg   Quantity filled last month: 120   # of tablets left on hand: 56      Did you miss any doses in the past 4 weeks? No missed doses reported.    FINANCIAL/SHIPPING    Delivery Scheduled: Yes, Expected medication delivery date: 06/30/17     Ines Bloomer did not have any additional questions at this time.    Delivery address validated in [facility]SI scheduling system: Yes, address listed in FSI is correct.    We will follow up with patient monthly for standard refill processing and delivery.      Thank you,  Lupita Shutter   [facility] Specialty Pharmacist

## 2017-06-29 MED FILL — MYCOPHENOLATE MOFETIL/500MG/TABS: MYCOPHENOLATE MOFETIL/500MG/TABS | 30 days supply | Qty: 120 | Fill #1

## 2017-07-20 ENCOUNTER — Ambulatory Visit
Admission: RE | Admit: 2017-07-20 | Discharge: 2017-07-20 | Disposition: A | Discharge status: Home / Self Care | Payer: Self-pay | Source: Ambulatory Visit | Attending: Internal Medicine | Admitting: Internal Medicine

## 2017-07-20 DIAGNOSIS — Z1231 Encounter for screening mammogram for malignant neoplasm of breast: Secondary | ICD-10-CM | POA: Insufficient documentation

## 2017-07-22 ENCOUNTER — Ambulatory Visit
Admit: 2017-07-22 | Discharge: 2017-07-22 | Payer: PRIVATE HEALTH INSURANCE | Attending: Gastroenterology | Primary: Gastroenterology

## 2017-07-22 DIAGNOSIS — R7989 Other specified abnormal findings of blood chemistry: Secondary | ICD-10-CM | POA: Diagnosis not present

## 2017-07-22 DIAGNOSIS — K754 Autoimmune hepatitis: Secondary | ICD-10-CM | POA: Diagnosis not present

## 2017-07-22 DIAGNOSIS — R748 Abnormal levels of other serum enzymes: Secondary | ICD-10-CM | POA: Diagnosis not present

## 2017-07-22 DIAGNOSIS — Z7952 Long term (current) use of systemic steroids: Secondary | ICD-10-CM | POA: Diagnosis not present

## 2017-07-22 DIAGNOSIS — K7689 Other specified diseases of liver: Secondary | ICD-10-CM | POA: Diagnosis not present

## 2017-07-22 DIAGNOSIS — Z888 Allergy status to other drugs, medicaments and biological substances status: Secondary | ICD-10-CM | POA: Diagnosis not present

## 2017-07-22 LAB — COMPREHENSIVE METABOLIC PANEL
ALBUMIN: 4.7 g/dL (ref 3.5–5.0)
ALKALINE PHOSPHATASE: 59 U/L (ref 38–126)
ALT (SGPT): 50 U/L — ABNORMAL HIGH (ref 15–48)
AST (SGOT): 49 U/L — ABNORMAL HIGH (ref 14–38)
BILIRUBIN TOTAL: 1 mg/dL (ref 0.0–1.2)
BUN / CREAT RATIO: 23
CALCIUM: 10.1 mg/dL (ref 8.5–10.2)
CHLORIDE: 103 mmol/L (ref 98–107)
CO2: 29 mmol/L (ref 22.0–30.0)
CREATININE: 0.7 mg/dL (ref 0.60–1.00)
EGFR MDRD AF AMER: >=60 mL/min/{1.73_m2} (ref >=60)
EGFR MDRD NON AF AMER: >=60 mL/min/{1.73_m2} (ref >=60)
GLUCOSE RANDOM: 98 mg/dL (ref 65–179)
POTASSIUM: 4.3 mmol/L (ref 3.5–5.0)
PROTEIN TOTAL: 7.1 g/dL (ref 6.5–8.3)
SODIUM: 139 mmol/L (ref 135–145)

## 2017-07-22 LAB — RED BLOOD CELL COUNT: Lab: 4.43

## 2017-07-22 LAB — CBC W/ AUTO DIFF
BASOPHILS ABSOLUTE COUNT: 0 10*9/L (ref 0.0–0.1)
HEMATOCRIT: 43.1 % (ref 36.0–46.0)
HEMOGLOBIN: 14 g/dL (ref 12.0–16.0)
LARGE UNSTAINED CELLS: 2 % (ref 0–4)
LYMPHOCYTES ABSOLUTE COUNT: 1.6 10*9/L (ref 1.5–5.0)
MEAN CORPUSCULAR HEMOGLOBIN CONC: 32.6 g/dL (ref 31.0–37.0)
MEAN CORPUSCULAR HEMOGLOBIN: 31.7 pg (ref 26.0–34.0)
MEAN CORPUSCULAR VOLUME: 97.4 fL (ref 80.0–100.0)
MEAN PLATELET VOLUME: 9 fL (ref 7.0–10.0)
NEUTROPHILS ABSOLUTE COUNT: 3 10*9/L (ref 2.0–7.5)
PLATELET COUNT: 193 10*9/L (ref 150–440)
RED BLOOD CELL COUNT: 4.43 10*12/L (ref 4.00–5.20)
RED CELL DISTRIBUTION WIDTH: 14 % (ref 12.0–15.0)
WBC ADJUSTED: 5.2 10*9/L (ref 4.5–11.0)

## 2017-07-22 LAB — CREATINE KINASE TOTAL: Creatine kinase:CCnc:Pt:Ser/Plas:Qn:: 98

## 2017-07-22 LAB — AFP-TUMOR MARKER: Alpha-1-Fetoprotein.tumor marker:MCnc:Pt:Ser/Plas:Qn:: 6.13

## 2017-07-22 LAB — PROTIME: Lab: 10.3

## 2017-07-22 LAB — SODIUM: Sodium:SCnc:Pt:Ser/Plas:Qn:: 139

## 2017-07-22 NOTE — Unmapped (Signed)
[facility]    Margaret Berger, M.D.  Professor of Medicine  Director, [facility]  [place] [facility] [place] [facility] [place]    [phone]    [name]    Referred Self  No address on file     Referring MD: Margaret Minium, MD    Chief complaint: Office follow-up for elevation of liver enzymes. Hospitalization (08/2016) for acute hepatitis of uncertain etiology, presumed autoimmune hepatitis treated with prednisone and mycophenylate. DRUG FEVER ASSOCIATED WITH AZATHIOPRINE    Interval history: Patient feels completely well. Has no health related concerns. Went skiing and did fine.  Has great energy. She exercises daily. She denies fever, cough, sob, N/V, abdominal pain, chest pain, or skin rash. Eye symptoms have resolved. She has been maintained on mycophenylate 1gram bid and 2.5 mg of prednisone.        Present illness:  Patient is a 57 y.o. Caucasian female who was hospitalized at [facility] between approximately February 12 -  August 29, 2016 with acute hepatitis. The patient had about 10 weeks of abnormal liver tests documented by her primary physician and her primary gastroenterologist. This was preceded by a short flu-like illness characterized by fatigue, muscle aches, and low-grade fever. All of the symptoms have completely remained resolved since her  Visit 09/10/2016. However she was left with elevated liver tests and had an extensive workup as an outpatient and subsequently as an inpatient at [facility] to further define the etiology of her liver disease.  Additional details of her history and prior outpatient evaluation as well as her inpatient evaluation are available from our initial consultation note and follow-up notes while she was hospitalized.       After review of the liver biopsy and all serologic data thus far which has been unrevealing, it was felt that a therapeutic trial of prednisone for presumed seronegative autoimmune hepatitis was warranted. Prednisone  40 mg of daily started on 08/29/16. She had a good biochemical response with marked improvement in ALT, INR, including a slow but steady decrease in Total/direct bilirubin.     Patient had an extensive serological evaluation which was not revealing:  1. Antinuclear antibody and anti-smooth muscle antibody were negative. Immunoglobulin G was not elevated (1061)  2. Viral serologies including acute hepatitis A, hepatitis B, hepatitis C, HCV RNA, EBV PCR, CMV PCR, HHV-6 were all negative.  3. Liver ultrasound demonstrated patent vasculature with a nodular-appearing heterogeneous liver.  4. Percutaneous liver biopsy 08/2016 at [facility]:    A: Liver, core biopsy   - Severe active hepatitis with confluent areas of periportal hepatocellular necrosis (approximately 10-20% of sampled parenchyma) (see comment)   - Trichrome stain demonstrates at least periportal fibrosis and is suspicious for bridging fibrosis     Potential etiologies to consider include, but are not limited to, infection, drug/toxin-induced liver injury, seronegative autoimmune hepatitis, and metabolic disorders. The trichrome stain findings suggest that this process is at least subacute in duration.      Liver biopsy slides were personally reviewed by Dr. Sharon Mt  with the pathologist.  No viral inclusions were noted and only few plasma cells were visualized in the liver biopsy which was an adequate specimen. No cytopathic effect. EBV immunostains were negative. Fe in hepatocytes only grade 1/4    5.  Evaluation for Zoster: Dr. Foy Guadalajara received phone call from patient's primary GI MD that patient's Zoster IgM from 09/05/2016 was positive with titer 1.17 approximately one week after starting prednisone. Review  of her record though revealed this test to have been negative on 08/29/2016.  Dr. Sharon Mt discussed these findings with Dr. Lytle Butte and the decision was made to proceed with patient starting Valtrex 500 mg bid.  She was seen by Dr. 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   Recommendations from ID: Continue valacyclovir 500 mg bid.  Advised readdressing stopping Valtrex if her repeat IgM is negative or once she is on <15 mg prednisone daily.  Valtrex was discontinued last week.  Bactrim prophylaxis while prednisone >15 mg q 24. 1 DS tablet M/W/F. The following vaccines were recommended for her to obtain under the care of her PCP: Twinrix, PCV13, and PPSV23. She should have these performed by her PCP when prednisone dose is  <20 mg daily.     Immunosuppressive history:  Patient was started on azathioprine for steroid sparing benefits in early April.  After 2 weeks patient developed fevers. ID workup negative during hospitalization.  Azathioprine was held during that hospitalization and fevers resolved. When she restarted azathioprine as outpatient, immediately had high fevers and shaking chills.  This rechallenge was highly suggestive of drug fever from azathioprine.    On 5/12 patient was started on CellCept 500mg  bid and continued on prednisone 15 mg daily.  On 12/03/16, liver enzymes remained elevated. Cellcept was increased to 1000mg  BID on 12/04/16 and prednisone continued at 15 mg day which she continues as of today (01/07/17).       10 sys ROS otherwise negative.     Past medical history:  Patient has generally been healthy until the current hepatitis began in December 2017.  1. No history of diabetes, coronary artery disease, hypertension, or lung disease.  2. No history of other autoimmune systemic diseases  3. FIBROSCAN 11/05/16: 17.7 kps c/w stage F4 (Patient ate breakfast 3 hours prior).  4. MRI 10/2016: Stable ill-defined geographic area of T1 hypointensity, with associated persistent enhancement in the posterior right hepatic lobe, possibly related to abnormal perfusion in the setting of underlying hepatitis. ??Continued attention on follow-up is recommended. Reviewed in HB conference: changes suggestive of underlying advanced fibrosis/cirrhosis.   -- Cirrhotic liver. ??No foci of early arterial hyperenhancement or washout to suggest HCC.  -- Small caliber paraumbilical vein suggestive of portal hypertension.  -- Trace perihepatic and perisplenic ascites.  -- Gallbladder borderline hydropic, unchanged.    Allergies   Allergen Reactions   ??? Azathioprine Other (See Comments)     Fever       Current Outpatient Prescriptions   Medication Sig Dispense Refill   ??? ketoconazole (NIZORAL) 2 % cream Apply 1 application topically Two (2) times a day. To affected areas as directed 60 g 1   ??? mycophenolate (CELLCEPT) 500 mg tablet Take 2 tablets (1,000 mg total) by mouth Two (2) times a day. Take with food. 120 tablet 5   ??? predniSONE (DELTASONE) 10 MG tablet Take 1.5 tablets (15 mg total) by mouth daily. (Patient taking differently: Take 2.5 mg by mouth daily. ) 45 tablet 6     No current facility-administered medications for this visit.      Social history: The patient is married. She works as a Materials engineer. They have traveled extensively although none for the last year or so, until this recently planned trip. She does not drink any alcohol. She does not smoke cigarettes.    Family history: Negative for liver disease or liver cancer.  Negative for autoimmune systemic diseases.    Physical Examination:    BP 141/85  - Pulse 74  - Temp 36.4 ??C (97.5 ??F) (Oral)  - Resp 14  - Ht 167.6 cm (5' 5.98)  - Wt 70.1 kg (154 lb 9.6 oz)  - SpO2 99%  - BMI 24.97 kg/m??   General: Extremely Pleasant, WD, WN Caucasian female. NAD noted,   HEENT: Sclera anicteric, not injected,  no temporal muscle loss, oropharynx is negative  NECK: No thyromegaly or lymphadenopathy, No carotid bruits  Chest: Clear to auscultation and percussion  Heart: S1, S2, RR, No murmurs  Abdomen: Soft, non-tender, non-distended, no hepatosplenomegaly, no masses appreciated, no ascites  Skin: No spider angiomata, No rashes  Extremities: Without pedal edema, + palmar erythema  Neuro: Grossly intact, No focal deficits, no asterixis.    Laboratory Studies:     Results for orders placed or performed in visit on 07/22/17   Comprehensive Metabolic Panel   Result Value Ref Range    Sodium 139 135 - 145 mmol/L    Potassium 4.3 3.5 - 5.0 mmol/L    Chloride 103 98 - 107 mmol/L    CO2 29.0 22.0 - 30.0 mmol/L    BUN 16 7 - 21 mg/dL    Creatinine 9.56 3.87 - 1.00 mg/dL    BUN/Creatinine Ratio 23     EGFR MDRD Non Af Amer >=60 >=60 mL/min/1.83m2    EGFR MDRD Af Amer >=60 >=60 mL/min/1.35m2    Anion Gap 7 (L) 9 - 15 mmol/L    Glucose 98 65 - 179 mg/dL    Calcium 56.4 8.5 - 33.2 mg/dL    Albumin 4.7 3.5 - 5.0 g/dL    Total Protein 7.1 6.5 - 8.3 g/dL    Total Bilirubin 1.0 0.0 - 1.2 mg/dL    AST 49 (H) 14 - 38 U/L    ALT 50 (H) 15 - 48 U/L    Alkaline Phosphatase 59 38 - 126 U/L   AFP tumor marker   Result Value Ref Range    AFP-Tumor Marker 6.13 <7.51 ng/mL   PT-INR   Result Value Ref Range    PT 10.3 10.2 - 12.8 sec    INR 0.90    CK   Result Value Ref Range    Creatine Kinase, Total 98.0 45.0 - 145.0 U/L   CBC w/ Differential   Result Value Ref Range    WBC 5.2 4.5 - 11.0 10*9/L    RBC 4.43 4.00 - 5.20 10*12/L    HGB 14.0 12.0 - 16.0 g/dL    HCT 95.1 88.4 - 16.6 %    MCV 97.4 80.0 - 100.0 fL    MCH 31.7 26.0 - 34.0 pg    MCHC 32.6 31.0 - 37.0 g/dL    RDW 06.3 01.6 - 01.0 %    MPV 9.0 7.0 - 10.0 fL    Platelet 193 150 - 440 10*9/L    Absolute Neutrophils 3.0 2.0 - 7.5 10*9/L    Absolute Lymphocytes 1.6 1.5 - 5.0 10*9/L    Absolute Monocytes 0.3 0.2 - 0.8 10*9/L    Absolute Eosinophils 0.2 0.0 - 0.4 10*9/L    Absolute Basophils 0.0 0.0 - 0.1 10*9/L    Large Unstained Cells 2 0 - 4 %    Macrocytosis Slight (A) Not Present       Impression:  1. Acute hepatitis likely seronegative autoimmune hepatitis based on response to immunosuppressive medication. The patient presented with severe  hepatitis as evidenced by hyperbilirubinemia, elevation of her INR, decreased albumin, and results from her liver biopsy. Patient was started on a therapeutic trial of prednisone for the reasons discussed above. After only 4 days of immunosuppression, her liver enzymes (ALT)  that had been regularly elevated above 1000 decreased substantially.  This was highly suggestive of a positive effect of the prednisone and was c/w  presumed ANA-negative/SMA-negative autoimmune hepatitis. Recent labs showed continued normalization of bilirubin and INR, although she remained with mildly elevated ALT in the 50-60 range. CellCept was added on 5/12 and increased to 1000mg  BID on 12/04/16.Steroids continue to be tapered and she has been on only 2.5 mg daily for approx 3 months.  We will maintain current level of CellCept.  Will D/C steroids and monitor labs q 2weeks x 8 weeks to ensure transaminases remain at current levels. We discussed potential changes to immunosuppression if she experiences a flare.     2. Likelycirrhosis:  FIBROSCAN suggestive of cirrhosis and MRI suggestive of nodular contour with focal fibrosis.  Liver biopsy during acute episode did demonstrate possible bridging fibrosis. Inflammation now resolved.May consider repeat liver biopsy after prolonged remission to reevaluate. Repeat MRI at next visit.    3. HCC surveillance: For presumed cirrhosis as described above. Repeat MRI at next visit in 3 months.      4. Drug fevers from Azathioprine: resolved, no recurrent fever.    5. Mild thrombocytopenia: Resolved. Normal size spleen.     4. Twinrix x 3 completed.     5. Eye symptoms: Felt to be steroid-related,  Now resolved as steroid dose decreased.      RTC 12 weeks with interval labs to assess immunosuppression response.   MRI same day.       Margaret Berger, M.D.  Professor of Medicine  Director, [facility]  [place] [facility] [place] [facility] [place]    [phone]

## 2017-07-22 NOTE — Unmapped (Signed)
Doing well on current Cellcept regimen. OK to discontinue steroids. If you notice increased fatigue, nausea, lightheadedness please contact me. We will recheck labs every 2 weeks for the next 8 weeks. Return to office in 3 months with MRI on same day.

## 2017-08-03 NOTE — Unmapped (Signed)
[facility] Refill and Clinical Coordination Note  Medication(s): mycophenolate 500mg     [name], DOB: 01-Oct-1960  Phone: [phone] (home) [phone] (work), Alternate phone contact: N/A  Shipping address: [id number]  [place] [address]  Phone or address changes today?: No  All above HIPAA information verified.  Insurance changes? No    Completed refill and clinical call assessment today to schedule patient's medication shipment from the [facility] [phone]).      MEDICATION RECONCILIATION    Confirmed the medication and dosage are correct and have not changed: Yes, regimen is correct and unchanged.    Were there any changes to your medication(s) in the past month:  No, there are no changes reported at this time.    ADHERENCE    Is this medicine transplant or covered by Medicare Part B? No.    Did you miss any doses in the past 4 weeks? No missed doses reported.  Adherence counseling provided? Not needed     SIDE EFFECT MANAGEMENT    Are you tolerating your medication?:  Latiana reports tolerating the medication.  Side effect management discussed: None      Therapy is appropriate and should be continued.    Evidence of clinical benefit: See Epic note from 07/22/17      FINANCIAL/SHIPPING    Delivery Scheduled: Yes, Expected medication delivery date: 08/06/17   Additional medications refilled: No additional medications/refills needed at this time.    Alexismarie did not have any additional questions at this time.    Delivery address validated in FSI scheduling system: Yes, address listed above is correct.      We will follow up with patient monthly for standard refill processing and delivery.      Thank you,  Lupita Shutter   [facility] Specialty Pharmacist

## 2017-08-04 DIAGNOSIS — L820 Inflamed seborrheic keratosis: Secondary | ICD-10-CM | POA: Diagnosis not present

## 2017-08-04 DIAGNOSIS — L570 Actinic keratosis: Secondary | ICD-10-CM | POA: Diagnosis not present

## 2017-08-04 DIAGNOSIS — L700 Acne vulgaris: Secondary | ICD-10-CM | POA: Diagnosis not present

## 2017-08-04 DIAGNOSIS — D229 Melanocytic nevi, unspecified: Secondary | ICD-10-CM | POA: Diagnosis not present

## 2017-08-04 MED FILL — MYCOPHENOLATE MOFE/500MG/TABS: MYCOPHENOLATE MOFE/500MG/TABS | 30 days supply | Qty: 120 | Fill #2

## 2017-08-07 ENCOUNTER — Ambulatory Visit: Payer: Self-pay | Admitting: Internal Medicine

## 2017-08-12 ENCOUNTER — Encounter: Admit: 2017-08-12 | Discharge: 2017-08-13 | Payer: PRIVATE HEALTH INSURANCE

## 2017-08-12 DIAGNOSIS — K754 Autoimmune hepatitis: Secondary | ICD-10-CM | POA: Diagnosis not present

## 2017-08-12 DIAGNOSIS — Z79899 Other long term (current) drug therapy: Secondary | ICD-10-CM | POA: Diagnosis not present

## 2017-08-12 LAB — BASIC METABOLIC PANEL
ANION GAP: 9 mmol/L (ref 9–15)
BLOOD UREA NITROGEN: 14 mg/dL (ref 7–21)
BUN / CREAT RATIO: 23
CALCIUM: 9.9 mg/dL (ref 8.5–10.2)
CHLORIDE: 104 mmol/L (ref 98–107)
CO2: 27 mmol/L (ref 22.0–30.0)
CREATININE: 0.61 mg/dL (ref 0.60–1.00)
EGFR MDRD AF AMER: 122 mL/min/{1.73_m2} (ref >=60)
EGFR MDRD NON AF AMER: 101 mL/min/{1.73_m2} (ref >=60)
GLUCOSE RANDOM: 97 mg/dL (ref 65–179)
POTASSIUM: 4.8 mmol/L (ref 3.5–5.0)

## 2017-08-12 LAB — CBC W/ AUTO DIFF
EOSINOPHILS ABSOLUTE COUNT: 0.2 10*9/L (ref 0.0–0.4)
HEMATOCRIT: 43.1 % (ref 36.0–46.0)
HEMOGLOBIN: 13.7 g/dL (ref 12.0–16.0)
LYMPHOCYTES ABSOLUTE COUNT: 1.5 10*9/L (ref 1.5–5.0)
MEAN CORPUSCULAR HEMOGLOBIN CONC: 31.8 g/dL (ref 31.0–37.0)
MEAN CORPUSCULAR HEMOGLOBIN: 30.1 pg (ref 26.0–34.0)
MEAN CORPUSCULAR VOLUME: 94.6 fL (ref 80.0–100.0)
MEAN PLATELET VOLUME: 8 fL (ref 7.0–10.0)
MONOCYTES ABSOLUTE COUNT: 0.2 10*9/L (ref 0.2–0.8)
NEUTROPHILS ABSOLUTE COUNT: 3.1 10*9/L (ref 2.0–7.5)
PLATELET COUNT: 184 10*9/L (ref 150–440)
RED BLOOD CELL COUNT: 4.55 10*12/L (ref 4.00–5.20)
RED CELL DISTRIBUTION WIDTH: 13.4 % (ref 12.0–15.0)
WBC ADJUSTED: 5 10*9/L (ref 4.5–11.0)

## 2017-08-12 LAB — BILIRUBIN TOTAL: Bilirubin:MCnc:Pt:Ser/Plas:Qn:: 0.7

## 2017-08-12 LAB — AST (SGOT): Aspartate aminotransferase:CCnc:Pt:Ser/Plas:Qn:: 48 — ABNORMAL HIGH

## 2017-08-12 LAB — ALKALINE PHOSPHATASE: Alkaline phosphatase:CCnc:Pt:Ser/Plas:Qn:: 59

## 2017-08-12 LAB — BILIRUBIN DIRECT: Bilirubin.glucuronidated:MCnc:Pt:Ser/Plas:Qn:: 0.5 — ABNORMAL HIGH

## 2017-08-12 LAB — NEUTROPHILS ABSOLUTE COUNT: Lab: 3.1

## 2017-08-12 LAB — GAMMA GLUTAMYL TRANSFERASE: Gamma glutamyl transferase:CCnc:Pt:Ser/Plas:Qn:: 89 — ABNORMAL HIGH

## 2017-08-12 LAB — ALT (SGPT): Alanine aminotransferase:CCnc:Pt:Ser/Plas:Qn:: 48

## 2017-08-12 LAB — CREATININE: Creatinine:MCnc:Pt:Ser/Plas:Qn:: 0.61

## 2017-08-13 LAB — PROTEIN TOTAL: Protein:MCnc:Pt:Ser/Plas:Qn:: 6.8

## 2017-08-13 LAB — ALBUMIN: Albumin:MCnc:Pt:Ser/Plas:Qn:: 4.6

## 2017-09-01 DIAGNOSIS — R945 Abnormal results of liver function studies: Secondary | ICD-10-CM | POA: Diagnosis not present

## 2017-09-01 DIAGNOSIS — K754 Autoimmune hepatitis: Secondary | ICD-10-CM | POA: Diagnosis not present

## 2017-09-01 DIAGNOSIS — Z79899 Other long term (current) drug therapy: Secondary | ICD-10-CM | POA: Diagnosis not present

## 2017-09-01 NOTE — Unmapped (Signed)
Orders for labs via [facility] portal.  Notified patient that orders available.

## 2017-09-01 NOTE — Unmapped (Signed)
[facility] Special[facility] Pharm[facility] Refill Coordination Note  Specialty Medication(s): MYCOPHENOLATE 500MG   Additional Medications shipped: NONE    Margaret Berger, DOB: 1960/11/03  Phone: [phone] (home) [phone] (work), Alternate phone contact: N/A  Phone or address changes today?: No  All above HIPAA information was verified with patient.  Shipping Address: [id number]  [place] [address]   Insurance changes? No    Completed refill call assessment today to schedule patient's medication shipment from the [facility] [phone]).      Confirmed the medication and dosage are correct and have not changed: Yes, regimen is correct and unchanged.    Confirmed patient started or stopped the following medications in the past month:  No, there are no changes reported at this time.    Are you tolerating your medication?:  Margaret Berger reports tolerating the medication.    ADHERENCE    (Below is required for Medicare Part B or Transplant patients only - per drug):   How many tablets were dispensed last month:    Cellcept 500 mg   Quantity filled last month: 120   # of tablets left on hand: 7 Days           Did you miss any doses in the past 4 weeks? No missed doses reported.    FINANCIAL/SHIPPING    Delivery Scheduled: Yes, Expected medication delivery date: 09/03/17     The patient will receive an FSI print out for each medication shipped and additional FDA Medication Guides as required.  Patient education from [place] or Robet Leu may also be included in the shipment    Margaret Berger did not have any additional questions at this time.    Delivery address validated in FSI scheduling system: Yes, address listed in FSI is correct.    We will follow up with patient monthly for standard refill processing and delivery.      Thank you,  Unk Lightning   [facility] Specialty Technician

## 2017-09-02 LAB — CBC W/ DIFFERENTIAL
BANDED NEUTROPHILS ABSOLUTE COUNT: 0 10*3/uL (ref 0.0–0.1)
BASOPHILS ABSOLUTE COUNT: 0 10*3/uL (ref 0.0–0.2)
EOSINOPHILS ABSOLUTE COUNT: 0.1 10*3/uL (ref 0.0–0.4)
EOSINOPHILS RELATIVE PERCENT: 2 %
HEMATOCRIT: 38.9 % (ref 34.0–46.6)
HEMOGLOBIN: 12.9 g/dL (ref 11.1–15.9)
IMMATURE GRANULOCYTES: 0 %
LYMPHOCYTES ABSOLUTE COUNT: 1.8 10*3/uL (ref 0.7–3.1)
LYMPHOCYTES RELATIVE PERCENT: 30 %
MEAN CORPUSCULAR HEMOGLOBIN CONC: 33.2 g/dL (ref 31.5–35.7)
MEAN CORPUSCULAR HEMOGLOBIN: 31.2 pg (ref 26.6–33.0)
MEAN CORPUSCULAR VOLUME: 94 fL (ref 79–97)
MONOCYTES ABSOLUTE COUNT: 0.4 10*3/uL (ref 0.1–0.9)
NEUTROPHILS ABSOLUTE COUNT: 3.7 10*3/uL (ref 1.4–7.0)
NEUTROPHILS RELATIVE PERCENT: 61 %
PLATELET COUNT: 187 10*3/uL (ref 150–379)
RED BLOOD CELL COUNT: 4.13 x10E6/uL (ref 3.77–5.28)
RED CELL DISTRIBUTION WIDTH: 13.8 % (ref 12.3–15.4)

## 2017-09-02 LAB — INR: Lab: 1

## 2017-09-02 LAB — COMPREHENSIVE METABOLIC PANEL
A/G RATIO: 2.1 (ref 1.2–2.2)
ALBUMIN: 4.8 g/dL (ref 3.5–5.5)
ALKALINE PHOSPHATASE: 64 IU/L (ref 39–117)
ALT (SGPT): 32 IU/L (ref 0–32)
AST (SGOT): 38 IU/L (ref 0–40)
BILIRUBIN TOTAL: 0.4 mg/dL (ref 0.0–1.2)
BUN / CREAT RATIO: 15 (ref 9–23)
CALCIUM: 9.9 mg/dL (ref 8.7–10.2)
CHLORIDE: 99 mmol/L (ref 96–106)
CREATININE: 0.78 mg/dL (ref 0.57–1.00)
GFR MDRD AF AMER: 97 mL/min/{1.73_m2}
GFR MDRD NON AF AMER: 84 mL/min/{1.73_m2}
GLOBULIN, TOTAL: 2.3 g/dL (ref 1.5–4.5)
POTASSIUM: 4.6 mmol/L (ref 3.5–5.2)
SODIUM: 140 mmol/L (ref 134–144)
TOTAL PROTEIN: 7.1 g/dL (ref 6.0–8.5)

## 2017-09-02 LAB — GAMMA GLUTAMYL TRANSFERASE: Lab: 69 — ABNORMAL HIGH

## 2017-09-02 LAB — GLOBULIN, TOTAL: Lab: 2.3

## 2017-09-02 LAB — BASOPHILS RELATIVE PERCENT: Lab: 0

## 2017-09-02 LAB — CREATINE KINASE TOTAL: Lab: 225 — ABNORMAL HIGH

## 2017-09-02 LAB — BILIRUBIN DIRECT: Lab: 0.12

## 2017-09-02 LAB — BILIRUBIN, DIRECT: BILIRUBIN DIRECT: 0.12 mg/dL (ref 0.00–0.40)

## 2017-09-02 LAB — PROTIME-INR: PROTHROMBIN TIME: 10.4 s (ref 9.1–12.0)

## 2017-09-02 MED FILL — MYCOPHENOLATE MOFETIL/500MG/TABS: MYCOPHENOLATE MOFETIL/500MG/TABS | 30 days supply | Qty: 120 | Fill #3

## 2017-09-22 ENCOUNTER — Ambulatory Visit: Payer: Self-pay | Admitting: Internal Medicine

## 2017-09-28 ENCOUNTER — Encounter: Payer: Self-pay | Admitting: Internal Medicine

## 2017-09-28 NOTE — Unmapped (Signed)
[facility] Pharmacy Refill Coordination Note  Specialty Medication(s): Mycophenolate 500mg        [name], DOB: 12/31/1960  Phone: [phone] (home) [phone] (work), Alternate phone contact: N/A  Phone or address changes today?: No  All above HIPAA information was verified with patient.  Shipping Address: [id number]  [place] [address]   Insurance changes? No    Completed refill call assessment today to schedule patient's medication shipment from the [facility] [phone]).      Confirmed the medication and dosage are correct and have not changed: Yes, regimen is correct and unchanged.    Confirmed patient started or stopped the following medications in the past month:  No, there are no changes reported at this time.    Are you tolerating your medication?:  Paulene reports tolerating the medication.    ADHERENCE    (Below is required for Medicare Part B or Transplant patients only - per drug):   How many tablets were dispensed last month: Mycophenolate=120  Patient currently has 1week remaining.    Did you miss any doses in the past 4 weeks? No missed doses reported.    FINANCIAL/SHIPPING    Delivery Scheduled: Yes, Expected medication delivery date: 10/01/2017     The patient will receive an FSI print out for each medication shipped and additional FDA Medication Guides as required.  Patient education from [place] or Robet Leu may also be included in the shipment    Annasofia did not have any additional questions at this time.    Delivery address validated in FSI scheduling system: Yes, address listed in FSI is correct.    We will follow up with patient monthly for standard refill processing and delivery.      Thank you,  Quintavius Niebuhr  Anders Grant   [facility] Specialty Pharmacist

## 2017-09-30 MED FILL — MYCOPHENOLATE MOFE/500MG/TABS: MYCOPHENOLATE MOFE/500MG/TABS | 30 days supply | Qty: 120 | Fill #4

## 2017-10-05 NOTE — Unmapped (Signed)
Orders to [facility] via portal

## 2017-10-06 ENCOUNTER — Encounter: Admit: 2017-10-06 | Discharge: 2017-10-07 | Payer: PRIVATE HEALTH INSURANCE

## 2017-10-06 DIAGNOSIS — K754 Autoimmune hepatitis: Secondary | ICD-10-CM | POA: Diagnosis not present

## 2017-10-06 DIAGNOSIS — Z79899 Other long term (current) drug therapy: Secondary | ICD-10-CM | POA: Diagnosis not present

## 2017-10-06 DIAGNOSIS — R945 Abnormal results of liver function studies: Secondary | ICD-10-CM | POA: Diagnosis not present

## 2017-10-06 LAB — COMPREHENSIVE METABOLIC PANEL
ALBUMIN: 4.7 g/dL (ref 3.5–5.0)
ALKALINE PHOSPHATASE: 65 U/L (ref 38–126)
ALT (SGPT): 38 U/L (ref 15–48)
ANION GAP: 8 mmol/L — ABNORMAL LOW (ref 9–15)
AST (SGOT): 44 U/L — ABNORMAL HIGH (ref 14–38)
BILIRUBIN TOTAL: 1 mg/dL (ref 0.0–1.2)
BUN / CREAT RATIO: 23
CALCIUM: 10.3 mg/dL — ABNORMAL HIGH (ref 8.5–10.2)
CHLORIDE: 103 mmol/L (ref 98–107)
CO2: 28 mmol/L (ref 22.0–30.0)
CREATININE: 0.66 mg/dL (ref 0.60–1.00)
EGFR MDRD AF AMER: 111 mL/min/{1.73_m2} (ref >=60)
EGFR MDRD NON AF AMER: 92 mL/min/{1.73_m2} (ref >=60)
GLUCOSE RANDOM: 93 mg/dL (ref 65–179)
POTASSIUM: 4.5 mmol/L (ref 3.5–5.0)
PROTEIN TOTAL: 7.4 g/dL (ref 6.5–8.3)
SODIUM: 139 mmol/L (ref 135–145)

## 2017-10-06 LAB — CBC W/ AUTO DIFF
BASOPHILS ABSOLUTE COUNT: 0 10*9/L (ref 0.0–0.1)
BASOPHILS RELATIVE PERCENT: 0.4 %
EOSINOPHILS ABSOLUTE COUNT: 0.2 10*9/L (ref 0.0–0.4)
EOSINOPHILS RELATIVE PERCENT: 3.3 %
HEMATOCRIT: 43.3 % (ref 36.0–46.0)
HEMOGLOBIN: 13.9 g/dL (ref 12.0–16.0)
LARGE UNSTAINED CELLS: 2 % (ref 0–4)
LYMPHOCYTES ABSOLUTE COUNT: 1.4 10*9/L — ABNORMAL LOW (ref 1.5–5.0)
LYMPHOCYTES RELATIVE PERCENT: 30.4 %
MEAN CORPUSCULAR HEMOGLOBIN: 30.4 pg (ref 26.0–34.0)
MEAN CORPUSCULAR VOLUME: 94.7 fL (ref 80.0–100.0)
MEAN PLATELET VOLUME: 8.6 fL (ref 7.0–10.0)
MONOCYTES ABSOLUTE COUNT: 0.3 10*9/L (ref 0.2–0.8)
MONOCYTES RELATIVE PERCENT: 7 %
NEUTROPHILS ABSOLUTE COUNT: 2.7 10*9/L (ref 2.0–7.5)
NEUTROPHILS RELATIVE PERCENT: 56.9 %
PLATELET COUNT: 173 10*9/L (ref 150–440)
RED BLOOD CELL COUNT: 4.57 10*12/L (ref 4.00–5.20)
RED CELL DISTRIBUTION WIDTH: 13.7 % (ref 12.0–15.0)
WBC ADJUSTED: 4.8 10*9/L (ref 4.5–11.0)

## 2017-10-06 LAB — BILIRUBIN DIRECT: Bilirubin.glucuronidated:MCnc:Pt:Ser/Plas:Qn:: 0.5 — ABNORMAL HIGH

## 2017-10-06 LAB — ALT (SGPT): Alanine aminotransferase:CCnc:Pt:Ser/Plas:Qn:: 38

## 2017-10-06 LAB — MEAN PLATELET VOLUME: Lab: 8.6

## 2017-10-26 ENCOUNTER — Encounter: Payer: Self-pay | Admitting: Internal Medicine

## 2017-11-02 ENCOUNTER — Encounter: Admit: 2017-11-02 | Discharge: 2017-11-02 | Payer: PRIVATE HEALTH INSURANCE

## 2017-11-02 ENCOUNTER — Encounter
Admit: 2017-11-02 | Discharge: 2017-11-02 | Payer: PRIVATE HEALTH INSURANCE | Attending: Gastroenterology | Primary: Gastroenterology

## 2017-11-02 DIAGNOSIS — R932 Abnormal findings on diagnostic imaging of liver and biliary tract: Secondary | ICD-10-CM | POA: Diagnosis not present

## 2017-11-02 DIAGNOSIS — K7460 Unspecified cirrhosis of liver: Secondary | ICD-10-CM | POA: Diagnosis not present

## 2017-11-02 DIAGNOSIS — K769 Liver disease, unspecified: Secondary | ICD-10-CM | POA: Diagnosis not present

## 2017-11-02 DIAGNOSIS — K754 Autoimmune hepatitis: Secondary | ICD-10-CM | POA: Diagnosis not present

## 2017-11-02 DIAGNOSIS — Z862 Personal history of diseases of the blood and blood-forming organs and certain disorders involving the immune mechanism: Secondary | ICD-10-CM | POA: Diagnosis not present

## 2017-11-02 DIAGNOSIS — Z79899 Other long term (current) drug therapy: Secondary | ICD-10-CM | POA: Diagnosis not present

## 2017-11-02 DIAGNOSIS — K7290 Hepatic failure, unspecified without coma: Secondary | ICD-10-CM | POA: Diagnosis not present

## 2017-11-02 DIAGNOSIS — Z6824 Body mass index (BMI) 24.0-24.9, adult: Secondary | ICD-10-CM | POA: Diagnosis not present

## 2017-11-02 DIAGNOSIS — Z888 Allergy status to other drugs, medicaments and biological substances status: Secondary | ICD-10-CM | POA: Diagnosis not present

## 2017-11-02 LAB — COMPREHENSIVE METABOLIC PANEL
ALBUMIN: 4.8 g/dL (ref 3.5–5.0)
ALKALINE PHOSPHATASE: 60 U/L (ref 38–126)
ALT (SGPT): 43 U/L (ref 15–48)
ANION GAP: 7 mmol/L — ABNORMAL LOW (ref 9–15)
AST (SGOT): 42 U/L — ABNORMAL HIGH (ref 14–38)
BILIRUBIN TOTAL: 0.6 mg/dL (ref 0.0–1.2)
BUN / CREAT RATIO: 29
CALCIUM: 10.3 mg/dL — ABNORMAL HIGH (ref 8.5–10.2)
CHLORIDE: 107 mmol/L (ref 98–107)
CO2: 27 mmol/L (ref 22.0–30.0)
CREATININE: 0.63 mg/dL (ref 0.60–1.00)
EGFR MDRD AF AMER: >=60 mL/min/{1.73_m2} (ref >=60)
EGFR MDRD NON AF AMER: >=60 mL/min/{1.73_m2} (ref >=60)
GLUCOSE RANDOM: 94 mg/dL (ref 65–179)
POTASSIUM: 4.8 mmol/L (ref 3.5–5.0)
PROTEIN TOTAL: 7.5 g/dL (ref 6.5–8.3)
SODIUM: 141 mmol/L (ref 135–145)

## 2017-11-02 LAB — CBC W/ AUTO DIFF
BASOPHILS ABSOLUTE COUNT: 0 10*9/L (ref 0.0–0.1)
BASOPHILS RELATIVE PERCENT: 0.3 %
EOSINOPHILS ABSOLUTE COUNT: 0.1 10*9/L (ref 0.0–0.4)
HEMATOCRIT: 41.7 % (ref 36.0–46.0)
HEMOGLOBIN: 13.4 g/dL (ref 12.0–16.0)
LARGE UNSTAINED CELLS: 1 % (ref 0–4)
LYMPHOCYTES ABSOLUTE COUNT: 1.2 10*9/L — ABNORMAL LOW (ref 1.5–5.0)
LYMPHOCYTES RELATIVE PERCENT: 29 %
MEAN CORPUSCULAR HEMOGLOBIN CONC: 32.2 g/dL (ref 31.0–37.0)
MEAN CORPUSCULAR HEMOGLOBIN: 30.5 pg (ref 26.0–34.0)
MEAN CORPUSCULAR VOLUME: 94.7 fL (ref 80.0–100.0)
MEAN PLATELET VOLUME: 9.9 fL (ref 7.0–10.0)
MONOCYTES RELATIVE PERCENT: 4.5 %
NEUTROPHILS ABSOLUTE COUNT: 2.7 10*9/L (ref 2.0–7.5)
NEUTROPHILS RELATIVE PERCENT: 63 %
PLATELET COUNT: 180 10*9/L (ref 150–440)
RED CELL DISTRIBUTION WIDTH: 13.5 % (ref 12.0–15.0)
WBC ADJUSTED: 4.2 10*9/L — ABNORMAL LOW (ref 4.5–11.0)

## 2017-11-02 LAB — LYMPHOCYTES ABSOLUTE COUNT: Lab: 1.2 — ABNORMAL LOW

## 2017-11-02 LAB — AFP-TUMOR MARKER: Alpha-1-Fetoprotein.tumor marker:MCnc:Pt:Ser/Plas:Qn:: 5.72

## 2017-11-02 LAB — INR: Lab: 0.95

## 2017-11-02 LAB — GAMMA GLUTAMYL TRANSFERASE: Gamma glutamyl transferase:CCnc:Pt:Ser/Plas:Qn:: 74 — ABNORMAL HIGH

## 2017-11-02 LAB — BILIRUBIN DIRECT: Bilirubin.glucuronidated:MCnc:Pt:Ser/Plas:Qn:: 0.3

## 2017-11-02 LAB — BUN / CREAT RATIO: Urea nitrogen/Creatinine:MRto:Pt:Ser/Plas:Qn:: 29

## 2017-11-02 NOTE — Unmapped (Signed)
Autoimmune hepatitis. Doing well on Cellcept. Continue this medication indefinitely. We will continue to monitor liver enzymes every 1-3 months. Return to office in 3 months.

## 2017-11-03 NOTE — Unmapped (Signed)
[facility] Specialty Pharmacy Refill Coordination Note    Specialty Medication(s) to be Shipped:   General Specialty: MYCOPHENOLATE 500MG  TAB    Other medication(s) to be shipped:       Margaret Berger, DOB: [birth year]  Phone: [phone] (home) [phone] (work)  Shipping Address: [id number]  [place] [address]    All above HIPAA information was verified with patient.     Completed refill call assessment today to schedule patient's medication shipment from the [facility] [phone]).       Specialty medication(s) and dose(s) confirmed: Regimen is correct and unchanged.   Changes to medications: Trenity reports no changes reported at this time.  Changes to insurance: No  Questions for the pharmacist: No    The patient will receive an FSI print out for each medication shipped and additional FDA Medication Guides as required.  Patient education from [place] or Robet Leu may also be included in the shipment.    DISEASE-SPECIFIC INFORMATION        N/A    ADHERENCE              MEDICARE PART B DOCUMENTATION       SHIPPING     Shipping address confirmed in FSI.     Delivery Scheduled: Yes, Expected medication delivery date: [phone] via UPS or courier.     Antonietta Barcelona   [facility] Specialty Technician

## 2017-11-05 MED FILL — MYCOPHENOLATE MOFE/500MG/TABS: MYCOPHENOLATE MOFE/500MG/TABS | 30 days supply | Qty: 120 | Fill #5

## 2017-11-13 NOTE — Unmapped (Signed)
[facility]    [name], M.D.  Professor of Medicine  Director, [facility]  [place] [facility] [place] [facility] [place]    [phone]    [name]    Referred Self  No address on file     Referring MD: Midge Minium, MD    Chief complaint: Office follow-up for elevation of liver enzymes. Hospitalization (08/2016) for acute hepatitis of uncertain etiology, presumed autoimmune hepatitis treated with prednisone and mycophenylate. DRUG FEVER ASSOCIATED WITH AZATHIOPRINE. STEROIDS STOPPED 07/2017. CONTINUES ON CELLCEPT 1 GRAM BID FOR IMMUNOSUPPRESSION    Interval history: Patient feels completely well. She exercises daily, although recently stoppped when she hurt her back moving furniture. improved. She denies fever, cough, sob, N/V, abdominal pain, chest pain, or skin rash. Eye symptoms have resolved. All prednisone stopped as of 07/2017. She has been maintained on mycophenylate 1gram bid.       Present illness:  Patient is a 57 y.o. Caucasian female who was hospitalized at [facility] between approximately February 12 -  August 29, 2016 with acute hepatitis. The patient had about 10 weeks of abnormal liver tests documented by her primary physician and her primary gastroenterologist. This was preceded by a short flu-like illness characterized by fatigue, muscle aches, and low-grade fever. All of the symptoms have completely remained resolved since her  Visit 09/10/2016. However she was left with elevated liver tests and had an extensive workup as an outpatient and subsequently as an inpatient at [facility] to further define the etiology of her liver disease.  Additional details of her history and prior outpatient evaluation as well as her inpatient evaluation are available from our initial consultation note and follow-up notes while she was hospitalized.       After review of the liver biopsy and all serologic data thus far which has been unrevealing, it was felt that a therapeutic trial of prednisone for presumed seronegative autoimmune hepatitis was warranted. Prednisone  40 mg of daily started on 08/29/16. She had a good biochemical response with marked improvement in ALT, INR, including a slow but steady decrease in Total/direct bilirubin.     Patient had an extensive serological evaluation which was not revealing:  1. Antinuclear antibody and anti-smooth muscle antibody were negative. Immunoglobulin G was not elevated (1061)  2. Viral serologies including acute hepatitis A, hepatitis B, hepatitis C, HCV RNA, EBV PCR, CMV PCR, HHV-6 were all negative.  3. Liver ultrasound demonstrated patent vasculature with a nodular-appearing heterogeneous liver.  4. Percutaneous liver biopsy 08/2016 at [facility]:    A: Liver, core biopsy   - Severe active hepatitis with confluent areas of periportal hepatocellular necrosis (approximately 10-20% of sampled parenchyma) (see comment)   - Trichrome stain demonstrates at least periportal fibrosis and is suspicious for bridging fibrosis     Potential etiologies to consider include, but are not limited to, infection, drug/toxin-induced liver injury, seronegative autoimmune hepatitis, and metabolic disorders. The trichrome stain findings suggest that this process is at least subacute in duration.      Liver biopsy slides were personally reviewed by Dr. Sharon Mt  with the pathologist.  No viral inclusions were noted and only few plasma cells were visualized in the liver biopsy which was an adequate specimen. No cytopathic effect. EBV immunostains were negative. Fe in hepatocytes only grade 1/4    5.  Evaluation for Zoster: Dr. Foy Guadalajara received phone call from patient's primary GI MD that patient's Zoster IgM from 09/05/2016 was positive with titer  1.17 approximately one week after starting prednisone. Review of her record though revealed this test to have been negative on 08/29/2016.  Dr. Sharon Mt discussed these findings with Dr. Lytle Butte and the decision was made to proceed with patient starting Valtrex 500 mg bid.  She was seen by Dr. 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   Recommendations from ID: Continue valacyclovir 500 mg bid.  Advised readdressing stopping Valtrex if her repeat IgM is negative or once she is on <15 mg prednisone daily.  Valtrex was discontinued last week.  Bactrim prophylaxis while prednisone >15 mg q 24. 1 DS tablet M/W/F. The following vaccines were recommended for her to obtain under the care of her PCP: Twinrix, PCV13, and PPSV23. She should have these performed by her PCP when prednisone dose is  <20 mg daily.     Immunosuppressive history:  Patient was started on azathioprine for steroid sparing benefits in early April.  After 2 weeks patient developed fevers. ID workup negative during hospitalization.  Azathioprine was held during that hospitalization and fevers resolved. When she restarted azathioprine as outpatient, immediately had high fevers and shaking chills.  This rechallenge was highly suggestive of drug fever from azathioprine.    On 5/12 patient was started on CellCept 500mg  bid and continued on prednisone 15 mg daily.  On 12/03/16, liver enzymes remained elevated. Cellcept was increased to 1000mg  BID on 12/04/16. Prednisone tapered to off as of 07/2017.     10 sys ROS otherwise negative.     Past medical history:  Patient has generally been healthy until the current hepatitis began in December 2017.  1. No history of diabetes, coronary artery disease, hypertension, or lung disease.  2. No history of other autoimmune systemic diseases  3. FIBROSCAN 11/05/16: 17.7 kps c/w stage F4 (Patient ate breakfast 3 hours prior).  4. MRI 10/2017: HEPATOBILIARY: Nodular liver contour compatible with history of cirrhosis and similar to prior. Again seen is a geographic region of T1 hypointensity and corresponding slight T2 hyperintensity in the posterior right hepatic lobe which is overall similar in extent when compared to prior imaging. This region demonstrates progressive postcontrast enhancement as before. Overall similar degree of capsular retraction most pronounced along the right anterior margin of the liver (13:29). No discrete early arterially enhancing or washout liver lesion identified. No biliary ductal dilatation. Similar appearance and position of the borderline hydropic gallbladder.  PANCREAS: Pancreas divisum. No ductal dilatation.    Allergies   Allergen Reactions   ??? Azathioprine Other (See Comments)     Fever       Current Outpatient Medications   Medication Sig Dispense Refill   ??? mycophenolate (CELLCEPT) 500 mg tablet Take 2 tablets (1,000 mg total) by mouth Two (2) times a day. Take with food. 120 tablet 5   ??? ketoconazole (NIZORAL) 2 % cream Apply 1 application topically Two (2) times a day. To affected areas as directed (Patient not taking: Reported on 11/02/2017) 60 g 1     No current facility-administered medications for this visit.      Social history: The patient is married. She works as a Materials engineer. They have traveled extensively.   She does not drink any alcohol. She does not smoke cigarettes.    Family history: Negative for liver disease or liver cancer. Negative for autoimmune systemic diseases.    Physical Examination:    BP 118/76  - Pulse 60  -  Temp 36.5 ??C (97.7 ??F)  - Resp 14  - Ht 167.6 cm (5' 5.98)  - Wt 68.5 kg (151 lb 1.6 oz)  - SpO2 100%  - BMI 24.40 kg/m??   General: Extremely Pleasant, WD, WN Caucasian female. NAD noted,   HEENT: Sclera anicteric, not injected,  no temporal muscle loss, oropharynx is negative  NECK: No thyromegaly or lymphadenopathy, No carotid bruits  Chest: Clear to auscultation and percussion  Heart: S1, S2, RR, No murmurs  Abdomen: Soft, non-tender, non-distended, no hepatosplenomegaly, no masses appreciated, no ascites  Skin: No spider angiomata, No rashes  Extremities: Without pedal edema, + palmar erythema  Neuro: Grossly intact, No focal deficits, no asterixis.    Laboratory Studies:     Results for orders placed or performed in visit on 11/02/17   Comprehensive Metabolic Panel   Result Value Ref Range    Sodium 141 135 - 145 mmol/L    Potassium 4.8 3.5 - 5.0 mmol/L    Chloride 107 98 - 107 mmol/L    CO2 27.0 22.0 - 30.0 mmol/L    BUN 18 7 - 21 mg/dL    Creatinine 1.61 0.96 - 1.00 mg/dL    BUN/Creatinine Ratio 29     EGFR MDRD Non Af Amer >=60 >=60 mL/min/1.72m2    EGFR MDRD Af Amer >=60 >=60 mL/min/1.12m2    Anion Gap 7 (L) 9 - 15 mmol/L    Glucose 94 65 - 179 mg/dL    Calcium 04.5 (H) 8.5 - 10.2 mg/dL    Albumin 4.8 3.5 - 5.0 g/dL    Total Protein 7.5 6.5 - 8.3 g/dL    Total Bilirubin 0.6 0.0 - 1.2 mg/dL    AST 42 (H) 14 - 38 U/L    ALT 43 15 - 48 U/L    Alkaline Phosphatase 60 38 - 126 U/L   PT-INR   Result Value Ref Range    PT 10.8 10.2 - 12.8 sec    INR 0.95    Gamma GT (GGT)   Result Value Ref Range    GGT 74 (H) 11 - 48 U/L   Bilirubin, Direct   Result Value Ref Range    Bilirubin, Direct 0.30 0.00 - 0.40 mg/dL   AFP tumor marker   Result Value Ref Range    AFP-Tumor Marker 5.72 <7.51 ng/mL   CBC w/ Differential   Result Value Ref Range    WBC 4.2 (L) 4.5 - 11.0 10*9/L    RBC 4.41 4.00 - 5.20 10*12/L    HGB 13.4 12.0 - 16.0 g/dL    HCT 40.9 81.1 - 91.4 %    MCV 94.7 80.0 - 100.0 fL    MCH 30.5 26.0 - 34.0 pg    MCHC 32.2 31.0 - 37.0 g/dL    RDW 78.2 95.6 - 21.3 %    MPV 9.9 7.0 - 10.0 fL    Platelet 180 150 - 440 10*9/L    Neutrophils % 63.0 %    Lymphocytes % 29.0 %    Monocytes % 4.5 %    Eosinophils % 1.8 %    Basophils % 0.3 %    Absolute Neutrophils 2.7 2.0 - 7.5 10*9/L    Absolute Lymphocytes 1.2 (L) 1.5 - 5.0 10*9/L    Absolute Monocytes 0.2 0.2 - 0.8 10*9/L    Absolute Eosinophils 0.1 0.0 - 0.4 10*9/L    Absolute Basophils 0.0 0.0 - 0.1 10*9/L    Large Unstained Cells 1 0 -  4 %       Impression:  1. Seronegative autoimmune hepatitis based on response to immunosuppressive medication. The patient presented with severe hepatitis as evidenced by hyperbilirubinemia, elevation of her INR, decreased albumin, and results from her liver biopsy. Patient was started on a therapeutic trial of prednisone for the reasons discussed above. After only 4 days of immunosuppression, her liver enzymes (ALT)  that had been regularly elevated above 1000 decreased substantially.  This was highly suggestive of a positive effect of the prednisone and was c/w  presumed ANA-negative/SMA-negative autoimmune hepatitis. Recent labs showed continued normalization of bilirubin and INR, although she remained with mildly elevated ALT in the 40-45 range. CellCept was added on 5/12 and increased to 1000mg  BID on 12/04/16.Steroids tapered to off as of 07/2017.  We will maintain current level of CellCept.  ALT has remained minimally abnormal since D/C of steroids without flaring.     2. Likely cirrhosis, well compensated:  FIBROSCAN suggestive of cirrhosis and MRI suggestive of nodular contour with focal fibrosis.  Liver biopsy during acute episode did demonstrate possible bridging fibrosis. Inflammation now resolved.May consider repeat liver biopsy after prolonged remission to reevaluate. Repeat MRI at next visit.    3. HCC surveillance: For presumed cirrhosis as described above. Recent MRI with evidence of cirrhosis. No evidence of mass.      4. Drug fevers from Azathioprine: resolved, no recurrent fever.    5. Mild thrombocytopenia: Resolved. Normal size spleen.     4. Twinrix x 3 completed.     5. Eye symptoms: Felt to be steroid-related,  Now resolved as steroid dose decreased.      RTC 12 weeks with interval labs to assess immunosuppression response.         [name], M.D.  Professor of Medicine  Director, [facility]  [place] [facility] [place] [facility] [place]    [phone]

## 2017-11-16 ENCOUNTER — Encounter

## 2017-11-16 ENCOUNTER — Encounter: Payer: Self-pay | Admitting: Internal Medicine

## 2017-12-01 MED ORDER — MYCOPHENOLATE MOFETIL 500 MG TABLET
ORAL_TABLET | Freq: Two times a day (BID) | ORAL | 5 refills | 0 days | Status: CP
Start: 2017-12-01 — End: 2019-03-15

## 2017-12-01 NOTE — Unmapped (Signed)
[facility] Refill Coordination Note    Specialty Medication(s) to be Shipped:   General Specialty: mycophenolate 500mg     Other medication(s) to be shipped:        Margaret Berger, DOB: [birth year]  Phone: [phone] (home) [phone] (work)  Shipping Address: [id number]  [place] [address]    All above HIPAA information was verified with patient.     Completed refill call assessment today to schedule patient's medication shipment from the [facility] [phone]).       Specialty medication(s) and dose(s) confirmed: Regimen is correct and unchanged.   Changes to medications: Margaret Berger reports no changes reported at this time.  Changes to insurance: No  Questions for the pharmacist: No    The patient will receive an FSI print out for each medication shipped and additional FDA Medication Guides as required.  Patient education from Margaret Berger or Margaret Berger may also be included in the shipment.    DISEASE-SPECIFIC INFORMATION        N/A    ADHERENCE              MEDICARE PART B DOCUMENTATION         SHIPPING     Shipping address confirmed in FSI.     Delivery Scheduled: Yes, Expected medication delivery date: [id number] via UPS or courier.     Margaret Berger   [facility] Specialty Technician

## 2017-12-02 MED FILL — MYCOPHENOLATE MOFE/500MG/TABS: MYCOPHENOLATE MOFE/500MG/TABS | 30 days supply | Qty: 120 | Fill #0

## 2017-12-15 ENCOUNTER — Ambulatory Visit (INDEPENDENT_AMBULATORY_CARE_PROVIDER_SITE_OTHER): Payer: BLUE CROSS/BLUE SHIELD | Admitting: Internal Medicine

## 2017-12-15 ENCOUNTER — Encounter: Payer: Self-pay | Admitting: Internal Medicine

## 2017-12-15 ENCOUNTER — Other Ambulatory Visit (HOSPITAL_COMMUNITY)
Admission: RE | Admit: 2017-12-15 | Discharge: 2017-12-15 | Disposition: A | Discharge status: Home / Self Care | Payer: BLUE CROSS/BLUE SHIELD | Source: Ambulatory Visit | Attending: Internal Medicine | Admitting: Internal Medicine

## 2017-12-15 VITALS — BP 120/72 | HR 73 | Temp 98.2°F | Resp 15 | Ht 66.5 in | Wt 153.8 lb

## 2017-12-15 DIAGNOSIS — Z0000 Encounter for general adult medical examination without abnormal findings: Secondary | ICD-10-CM

## 2017-12-15 DIAGNOSIS — Z124 Encounter for screening for malignant neoplasm of cervix: Secondary | ICD-10-CM | POA: Insufficient documentation

## 2017-12-15 DIAGNOSIS — R5383 Other fatigue: Secondary | ICD-10-CM | POA: Diagnosis not present

## 2017-12-15 DIAGNOSIS — E782 Mixed hyperlipidemia: Secondary | ICD-10-CM | POA: Diagnosis not present

## 2017-12-15 DIAGNOSIS — K754 Autoimmune hepatitis: Secondary | ICD-10-CM

## 2017-12-15 DIAGNOSIS — E559 Vitamin D deficiency, unspecified: Secondary | ICD-10-CM | POA: Diagnosis not present

## 2017-12-15 DIAGNOSIS — Z23 Encounter for immunization: Secondary | ICD-10-CM

## 2017-12-15 NOTE — Progress Notes (Signed)
Patient ID: Jessica Zuniga, female    DOB: 10-04-1960  Age: 57 y.o. MRN: [id number]  The patient is here for annual preventive examination and management of other chronic and acute problems.  Last seen Sept 2016 Diagnosed with auotimmune hepatitis several yeas ago,  managed by [facility] with Cellcept LFTs being checked every 1-3 months    Normal mammogram Jan 2016 colonoscopy 2009  By Dr. Candace Zuniga   The risk factors are reflected in the social history.  The roster of all physicians providing medical care to patient - is listed in the Snapshot section of the chart.  Activities of daily living:  The patient is 100% independent in all ADLs: dressing, toileting, feeding as well as independent mobility  Home safety : The patient has smoke detectors in the home. They wear seatbelts.  There are no firearms at home. There is no violence in the home.   There is no risks for hepatitis, STDs or HIV. There is no   history of blood transfusion. They have no travel history to infectious disease endemic areas of the world.  The patient has seen their dentist in the last six month. They have seen their eye doctor in the last year. They deny  hearing difficulty with regard to whispered voices and some television programs.    Discussed the need for sun protection: hats, long sleeves and use of sunscreen if there is significant sun exposure.   Diet: the importance of a healthy diet is discussed. They do have a healthy diet.  The benefits of regular aerobic exercise were discussed. She is exercising vigorously 5 days per week    Depression screen: there are no signs or vegative symptoms of depression- irritability, change in appetite, anhedonia, sadness/tearfullness.  The following portions of the patient's history were reviewed and updated as appropriate: allergies, current medications, past family history, past medical history,  past surgical history, past social history  and problem list.  Visual acuity was not  assessed per patient preference since she has regular follow up with her ophthalmologist. Hearing and body mass index were assessed and reviewed.   During the course of the visit the patient was educated and counseled about appropriate screening and preventive services including : fall prevention , diabetes screening, nutrition counseling, colorectal cancer screening, and recommended immunizations.    CC: The primary encounter diagnosis was Screening for cervical cancer. Diagnoses of Vitamin D deficiency, Need for measles and rubella vaccination, Fatigue, unspecified type, Mixed hyperlipidemia, Encounter for preventive health examination, Autoimmune hepatitis (Jessica Zuniga), and Need for prophylactic vaccination against measles alone were also pertinent to this visit.  History Jessica Zuniga has a past medical history of Chest pain, HLD (hyperlipidemia), and Papilloma of breast.   She has a past surgical history that includes Breast surgery (Right, 1997); Breast excisional biopsy (Right, 1997); and Breast biopsy (Right, 12/2012).   Her family history includes Heart attack in her father; Heart disease in her maternal grandmother and paternal grandmother.She reports that she has never smoked. She has never used smokeless tobacco. She reports that she drinks alcohol. She reports that she does not use drugs.  Outpatient Medications Prior to Visit  Medication Sig Dispense Refill  . mycophenolate (CELLCEPT) 500 MG tablet   4   No facility-administered medications prior to visit.     Review of Systems  Patient denies headache, fevers, malaise, unintentional weight loss, skin rash, eye pain, sinus congestion and sinus pain, sore throat, dysphagia,  hemoptysis , cough, dyspnea, wheezing, chest pain,  palpitations, orthopnea, edema, abdominal pain, nausea, melena, diarrhea, constipation, flank pain, dysuria, hematuria, urinary  Frequency, nocturia, numbness, tingling, seizures,  Focal weakness, Loss of consciousness,   Tremor, insomnia, depression, anxiety, and suicidal ideation.     Objective:  BP 120/72 (BP Location: Left Arm, Patient Position: Sitting, Cuff Size: Normal)   Pulse 73   Temp 98.2 F (36.8 C) (Oral)   Resp 15   Ht 5' 6.5" (1.689 m)   Wt 153 lb 12.8 oz (69.8 kg)   SpO2 98%   BMI 24.45 kg/m   Physical Exam   General Appearance:    Alert, cooperative, no distress, appears stated age  Head:    Normocephalic, without obvious abnormality, atraumatic  Eyes:    PERRL, conjunctiva/corneas clear, EOM's intact, fundi    benign, both eyes  Ears:    Normal TM's and external ear canals, both ears  Nose:   Nares normal, septum midline, mucosa normal, no drainage    or sinus tenderness  Throat:   Lips, mucosa, and tongue normal; teeth and gums normal  Neck:   Supple, symmetrical, trachea midline, no adenopathy;    thyroid:  no enlargement/tenderness/nodules; no carotid   bruit or JVD  Back:     Symmetric, no curvature, ROM normal, no CVA tenderness  Lungs:     Clear to auscultation bilaterally, respirations unlabored  Chest Wall:    No tenderness or deformity   Heart:    Regular rate and rhythm, S1 and S2 normal, no murmur, rub   or gallop  Breast Exam:    No tenderness, masses, or nipple abnormality  Abdomen:     Soft, non-tender, bowel sounds active all four quadrants,    no masses, no organomegaly  Genitalia:    Pelvic: cervix normal in appearance, external genitalia normal, no adnexal masses or tenderness, no cervical motion tenderness, rectovaginal septum normal, uterus normal size, shape, and consistency and vagina normal without discharge  Extremities:   Extremities normal, atraumatic, no cyanosis or edema  Pulses:   2+ and symmetric all extremities  Skin:   Skin color, texture, turgor normal, no rashes or lesions  Lymph nodes:   Cervical, supraclavicular, and axillary nodes normal  Neurologic:   CNII-XII intact, normal strength, sensation and reflexes    throughout       Assessment & Plan:   Problem List Items Addressed This Visit    Vitamin D deficiency   Relevant Orders   VITAMIN D 25 Hydroxy (Vit-D Deficiency, Fractures)   Need for prophylactic vaccination against measles alone    Checking measles titer given current immunocompromised state .      Encounter for preventive health examination    Annual comprehensive preventive exam was done as well as an evaluation and management of chronic conditions .  During the course of the visit the patient was educated and counseled about appropriate screening and preventive services including :  diabetes screening, lipid analysis with projected  10 year  risk for CAD , nutrition counseling, breast, cervical and colorectal cancer screening, and recommended immunizations.  Printed recommendations for health maintenance screenings was given.  PAP smear done Mammogram ordered Colonoscopy due next year       Autoimmune hepatitis ([place])    Managed initially with high doses of prednisone, nw with Cellcept ,  By [facility]      Relevant Orders   Comprehensive metabolic panel    Other Visit Diagnoses    Screening for cervical cancer    -  Primary   Relevant Orders   Cytology - PAP   Need for measles and rubella vaccination       Relevant Orders   Measles ([place]) Antibody IgG   Fatigue, unspecified type       Relevant Orders   TSH   Mixed hyperlipidemia       Relevant Orders   Lipid panel      I am having Jessica Zuniga maintain her mycophenolate.  No orders of the defined types were placed in this encounter.   There are no discontinued medications.  Follow-up: No follow-ups on file.   Crecencio Mc, MD

## 2017-12-15 NOTE — Patient Instructions (Addendum)
Good to see you again! Ask Dr Maceo Pro about the ShingRix vaccine  I will check your measles titer along with thyroid,  Viamin D and  Fasting lipids    Health Maintenance for Postmenopausal Women Menopause is a normal process in which your reproductive ability comes to an tshend. This process happens gradually over a span of months to years, usually between the ages of 65 and 13. Menopause is complete when you have missed 12 consecutive menstrual periods. It is important to talk with your health care provider about some of the most common conditions that affect postmenopausal women, such as heart disease, cancer, and bone loss (osteoporosis). Adopting a healthy lifestyle and getting preventive care can help to promote your health and wellness. Those actions can also lower your chances of developing some of these common conditions. What should I know about menopause? During menopause, you may experience a number of symptoms, such as:  Moderate-to-severe hot flashes.  Night sweats.  Decrease in sex drive.  Mood swings.  Headaches.  Tiredness.  Irritability.  Memory problems.  Insomnia.  Choosing to treat or not to treat menopausal changes is an individual decision that you make with your health care provider. What should I know about hormone replacement therapy and supplements? Hormone therapy products are effective for treating symptoms that are associated with menopause, such as hot flashes and night sweats. Hormone replacement carries certain risks, especially as you become older. If you are thinking about using estrogen or estrogen with progestin treatments, discuss the benefits and risks with your health care provider. What should I know about heart disease and stroke? Heart disease, heart attack, and stroke become more likely as you age. This may be due, in part, to the hormonal changes that your body experiences during menopause. These can affect how your body processes dietary  fats, triglycerides, and cholesterol. Heart attack and stroke are both medical emergencies. There are many things that you can do to help prevent heart disease and stroke:  Have your blood pressure checked at least every 1-2 years. High blood pressure causes heart disease and increases the risk of stroke.  If you are 58-71 years old, ask your health care provider if you should take aspirin to prevent a heart attack or a stroke.  Do not use any tobacco products, including cigarettes, chewing tobacco, or electronic cigarettes. If you need help quitting, ask your health care provider.  It is important to eat a healthy diet and maintain a healthy weight. ? Be sure to include plenty of vegetables, fruits, low-fat dairy products, and lean protein. ? Avoid eating foods that are high in solid fats, added sugars, or salt (sodium).  Get regular exercise. This is one of the most important things that you can do for your health. ? Try to exercise for at least 150 minutes each week. The type of exercise that you do should increase your heart rate and make you sweat. This is known as moderate-intensity exercise. ? Try to do strengthening exercises at least twice each week. Do these in addition to the moderate-intensity exercise.  Know your numbers.Ask your health care provider to check your cholesterol and your blood glucose. Continue to have your blood tested as directed by your health care provider.  What should I know about cancer screening? There are several types of cancer. Take the following steps to reduce your risk and to catch any cancer development as early as possible. Breast Cancer  Practice breast self-awareness. ? This means understanding how  your breasts normally appear and feel. ? It also means doing regular breast self-exams. Let your health care provider know about any changes, no matter how small.  If you are 109 or older, have a clinician do a breast exam (clinical breast exam or  CBE) every year. Depending on your age, family history, and medical history, it may be recommended that you also have a yearly breast X-ray (mammogram).  If you have a family history of breast cancer, talk with your health care provider about genetic screening.  If you are at high risk for breast cancer, talk with your health care provider about having an MRI and a mammogram every year.  Breast cancer (BRCA) gene test is recommended for women who have family members with BRCA-related cancers. Results of the assessment will determine the need for genetic counseling and BRCA1 and for BRCA2 testing. BRCA-related cancers include these types: ? Breast. This occurs in males or females. ? Ovarian. ? Tubal. This may also be called fallopian tube cancer. ? Cancer of the abdominal or pelvic lining (peritoneal cancer). ? Prostate. ? Pancreatic.  Cervical, Uterine, and Ovarian Cancer Your health care provider may recommend that you be screened regularly for cancer of the pelvic organs. These include your ovaries, uterus, and vagina. This screening involves a pelvic exam, which includes checking for microscopic changes to the surface of your cervix (Pap test).  For women ages 21-65, health care providers may recommend a pelvic exam and a Pap test every three years. For women ages 92-65, they may recommend the Pap test and pelvic exam, combined with testing for human papilloma virus (HPV), every five years. Some types of HPV increase your risk of cervical cancer. Testing for HPV may also be done on women of any age who have unclear Pap test results.  Other health care providers may not recommend any screening for nonpregnant women who are considered low risk for pelvic cancer and have no symptoms. Ask your health care provider if a screening pelvic exam is right for you.  If you have had past treatment for cervical cancer or a condition that could lead to cancer, you need Pap tests and screening for cancer  for at least 20 years after your treatment. If Pap tests have been discontinued for you, your risk factors (such as having a new sexual partner) need to be reassessed to determine if you should start having screenings again. Some women have medical problems that increase the chance of getting cervical cancer. In these cases, your health care provider may recommend that you have screening and Pap tests more often.  If you have a family history of uterine cancer or ovarian cancer, talk with your health care provider about genetic screening.  If you have vaginal bleeding after reaching menopause, tell your health care provider.  There are currently no reliable tests available to screen for ovarian cancer.  Lung Cancer Lung cancer screening is recommended for adults 46-52 years old who are at high risk for lung cancer because of a history of smoking. A yearly low-dose CT scan of the lungs is recommended if you:  Currently smoke.  Have a history of at least 30 pack-years of smoking and you currently smoke or have quit within the past 15 years. A pack-year is smoking an average of one pack of cigarettes per day for one year.  Yearly screening should:  Continue until it has been 15 years since you quit.  Stop if you develop a health problem that  would prevent you from having lung cancer treatment.  Colorectal Cancer  This type of cancer can be detected and can often be prevented.  Routine colorectal cancer screening usually begins at age 72 and continues through age 62.  If you have risk factors for colon cancer, your health care provider may recommend that you be screened at an earlier age.  If you have a family history of colorectal cancer, talk with your health care provider about genetic screening.  Your health care provider may also recommend using home test kits to check for hidden blood in your stool.  A small camera at the end of a tube can be used to examine your colon directly  (sigmoidoscopy or colonoscopy). This is done to check for the earliest forms of colorectal cancer.  Direct examination of the colon should be repeated every 5-10 years until age 62. However, if early forms of precancerous polyps or small growths are found or if you have a family history or genetic risk for colorectal cancer, you may need to be screened more often.  Skin Cancer  Check your skin from head to toe regularly.  Monitor any moles. Be sure to tell your health care provider: ? About any new moles or changes in moles, especially if there is a change in a mole's shape or color. ? If you have a mole that is larger than the size of a pencil eraser.  If any of your family members has a history of skin cancer, especially at a young age, talk with your health care provider about genetic screening.  Always use sunscreen. Apply sunscreen liberally and repeatedly throughout the day.  Whenever you are outside, protect yourself by wearing long sleeves, pants, a wide-brimmed hat, and sunglasses.  What should I know about osteoporosis? Osteoporosis is a condition in which bone destruction happens more quickly than new bone creation. After menopause, you may be at an increased risk for osteoporosis. To help prevent osteoporosis or the bone fractures that can happen because of osteoporosis, the following is recommended:  If you are 79-18 years old, get at least 1,000 mg of calcium and at least 600 mg of vitamin D per day.  If you are older than age 72 but younger than age 25, get at least 1,200 mg of calcium and at least 600 mg of vitamin D per day.  If you are older than age 52, get at least 1,200 mg of calcium and at least 800 mg of vitamin D per day.  Smoking and excessive alcohol intake increase the risk of osteoporosis. Eat foods that are rich in calcium and vitamin D, and do weight-bearing exercises several times each week as directed by your health care provider. What should I know about  how menopause affects my mental health? Depression may occur at any age, but it is more common as you become older. Common symptoms of depression include:  Low or sad mood.  Changes in sleep patterns.  Changes in appetite or eating patterns.  Feeling an overall lack of motivation or enjoyment of activities that you previously enjoyed.  Frequent crying spells.  Talk with your health care provider if you think that you are experiencing depression. What should I know about immunizations? It is important that you get and maintain your immunizations. These include:  Tetanus, diphtheria, and pertussis (Tdap) booster vaccine.  Influenza every year before the flu season begins.  Pneumonia vaccine.  Shingles vaccine.  Your health care provider may also recommend other immunizations.  This information is not intended to replace advice given to you by your health care provider. Make sure you discuss any questions you have with your health care provider. Document Released: 08/22/2005 Document Revised: 01/18/2016 Document Reviewed: 04/03/2015 Elsevier Interactive Patient Education  2018 [employer].

## 2017-12-16 DIAGNOSIS — K754 Autoimmune hepatitis: Secondary | ICD-10-CM | POA: Insufficient documentation

## 2017-12-16 DIAGNOSIS — Z23 Encounter for immunization: Secondary | ICD-10-CM | POA: Insufficient documentation

## 2017-12-16 NOTE — Assessment & Plan Note (Addendum)
Annual comprehensive preventive exam was done as well as an evaluation and management of chronic conditions .  During the course of the visit the patient was educated and counseled about appropriate screening and preventive services including :  diabetes screening, lipid analysis with projected  10 year  risk for CAD , nutrition counseling, breast, cervical and colorectal cancer screening, and recommended immunizations.  Printed recommendations for health maintenance screenings was given.  PAP smear done Mammogram ordered Colonoscopy due next year

## 2017-12-16 NOTE — Assessment & Plan Note (Signed)
Checking measles titer given current immunocompromised state .

## 2017-12-16 NOTE — Assessment & Plan Note (Signed)
Managed initially with high doses of prednisone, nw with Cellcept ,  By [facility]

## 2017-12-17 LAB — CYTOLOGY - PAP
Diagnosis: NEGATIVE
HPV (WINDOPATH): NOT DETECTED

## 2017-12-24 NOTE — Unmapped (Signed)
[facility] Refill and Clinical Coordination Note  Medication(s): mycophenolate 500mg     Margaret Berger, DOB: [birth year]  Phone: [phone] (home) [phone] (work), Alternate phone contact: N/A  Shipping address: [id number]  [place] [address]  Phone or address changes today?: Yes... New shipping address is [address], [place], [place] [address]  All above HIPAA information verified.  Insurance changes? No    Completed refill and clinical call assessment today to schedule patient's medication shipment from the [facility] [phone]).      MEDICATION RECONCILIATION    Confirmed the medication and dosage are correct and have not changed: Yes, regimen is correct and unchanged.    Were there any changes to your medication(s) in the past month:  No, there are no changes reported at this time.    ADHERENCE    Is this medicine transplant or covered by Medicare Part B? No.    Did you miss any doses in the past 4 weeks? No missed doses reported.  Adherence counseling provided? Not needed     SIDE EFFECT MANAGEMENT    Are you tolerating your medication?:  Margaret Berger reports tolerating the medication.  Side effect management discussed: None      Therapy is appropriate and should be continued.    Evidence of clinical benefit: See Epic note from 11/02/17      FINANCIAL/SHIPPING    Delivery Scheduled: Yes, Expected medication delivery date: 12/31/17   Additional medications refilled: No additional medications/refills needed at this time.    The patient will receive an FSI print out for each medication shipped and additional FDA Medication Guides as required.  Patient education from [place] or Margaret Berger may also be included in the shipment.    Margaret Berger did not have any additional questions at this time.    Delivery address validated in FSI scheduling system: Yes, address listed above is correct.      We will follow up with patient monthly for standard refill processing and delivery.      Thank you,  Lupita Shutter   [facility] Specialty Pharmacist

## 2017-12-30 DIAGNOSIS — L820 Inflamed seborrheic keratosis: Secondary | ICD-10-CM | POA: Diagnosis not present

## 2017-12-30 DIAGNOSIS — L720 Epidermal cyst: Secondary | ICD-10-CM | POA: Diagnosis not present

## 2017-12-30 DIAGNOSIS — D485 Neoplasm of uncertain behavior of skin: Secondary | ICD-10-CM | POA: Diagnosis not present

## 2017-12-30 DIAGNOSIS — B079 Viral wart, unspecified: Secondary | ICD-10-CM | POA: Diagnosis not present

## 2017-12-30 MED FILL — MYCOPHENOLATE MOFE/500MG/TABS: MYCOPHENOLATE MOFE/500MG/TABS | 30 days supply | Qty: 120 | Fill #1

## 2018-01-25 NOTE — Unmapped (Signed)
[facility] Refill Coordination Note  Specialty Medication(s): Mycophenolate  Additional Medications shipped: none    Margaret Berger, DOB: 1961/05/06  Phone: [phone] (home) [phone] (work), Alternate phone contact: N/A  Phone or address changes today?: No  All above HIPAA information was verified with patient.  Shipping Address: [id number]  [place] [address]   Insurance changes? No    Completed refill call assessment today to schedule patient's medication shipment from the [facility] [phone]).      Confirmed the medication and dosage are correct and have not changed: Yes, regimen is correct and unchanged.    Confirmed patient started or stopped the following medications in the past month:  No, there are no changes reported at this time.    Are you tolerating your medication?:  Margaret Berger reports tolerating the medication.    ADHERENCE    Did you miss any doses in the past 4 weeks? No missed doses reported.    FINANCIAL/SHIPPING    Delivery Scheduled: Yes, Expected medication delivery date: 01/29/18     The patient will receive an FSI print out for each medication shipped and additional FDA Medication Guides as required.  Patient education from [place] or Robet Leu may also be included in the shipment    Margaret Berger did not have any additional questions at this time.    Delivery address validated in FSI scheduling system: Yes, address listed in FSI is correct.    We will follow up with patient monthly for standard refill processing and delivery.      Thank you,  Rollen Sox   [facility] Specialty Pharmacist

## 2018-01-28 MED FILL — MYCOPHENOLATE MOFE/500MG/TABS: MYCOPHENOLATE MOFE/500MG/TABS | 30 days supply | Qty: 120 | Fill #2

## 2018-02-10 ENCOUNTER — Encounter
Admit: 2018-02-10 | Discharge: 2018-02-10 | Payer: PRIVATE HEALTH INSURANCE | Attending: Gastroenterology | Primary: Gastroenterology

## 2018-02-10 DIAGNOSIS — R945 Abnormal results of liver function studies: Secondary | ICD-10-CM | POA: Diagnosis not present

## 2018-02-10 DIAGNOSIS — K7460 Unspecified cirrhosis of liver: Secondary | ICD-10-CM | POA: Diagnosis not present

## 2018-02-10 DIAGNOSIS — Z6824 Body mass index (BMI) 24.0-24.9, adult: Secondary | ICD-10-CM | POA: Diagnosis not present

## 2018-02-10 DIAGNOSIS — K754 Autoimmune hepatitis: Secondary | ICD-10-CM | POA: Diagnosis not present

## 2018-02-10 LAB — COMPREHENSIVE METABOLIC PANEL
ALBUMIN: 4.7 g/dL (ref 3.5–5.0)
ALKALINE PHOSPHATASE: 63 U/L (ref 38–126)
ALT (SGPT): 27 U/L (ref 13–69)
ANION GAP: 8 mmol/L — ABNORMAL LOW (ref 9–15)
BLOOD UREA NITROGEN: 17 mg/dL (ref 7–21)
BUN / CREAT RATIO: 25
CALCIUM: 10 mg/dL (ref 8.5–10.2)
CHLORIDE: 103 mmol/L (ref 98–107)
CO2: 27 mmol/L (ref 22.0–30.0)
CREATININE: 0.67 mg/dL (ref 0.60–1.00)
EGFR CKD-EPI AA FEMALE: >90 mL/min/{1.73_m2} (ref >=60)
EGFR CKD-EPI NON-AA FEMALE: >90 mL/min/{1.73_m2} (ref >=60)
GLUCOSE RANDOM: 101 mg/dL (ref 65–179)
PROTEIN TOTAL: 7.6 g/dL (ref 6.5–8.3)
SODIUM: 138 mmol/L (ref 135–145)

## 2018-02-10 LAB — CALCIUM: Calcium:MCnc:Pt:Ser/Plas:Qn:: 10

## 2018-02-10 LAB — CBC W/ AUTO DIFF
BASOPHILS ABSOLUTE COUNT: 0 10*9/L (ref 0.0–0.1)
BASOPHILS RELATIVE PERCENT: 0.6 %
EOSINOPHILS ABSOLUTE COUNT: 0.1 10*9/L (ref 0.0–0.4)
EOSINOPHILS RELATIVE PERCENT: 1.8 %
HEMATOCRIT: 42 % (ref 36.0–46.0)
HEMOGLOBIN: 13.3 g/dL (ref 12.0–16.0)
LARGE UNSTAINED CELLS: 1 % (ref 0–4)
LYMPHOCYTES ABSOLUTE COUNT: 1.4 10*9/L — ABNORMAL LOW (ref 1.5–5.0)
MEAN CORPUSCULAR HEMOGLOBIN CONC: 31.7 g/dL (ref 31.0–37.0)
MEAN CORPUSCULAR HEMOGLOBIN: 29.8 pg (ref 26.0–34.0)
MEAN CORPUSCULAR VOLUME: 94 fL (ref 80.0–100.0)
MEAN PLATELET VOLUME: 7.6 fL (ref 7.0–10.0)
MONOCYTES RELATIVE PERCENT: 6 %
NEUTROPHILS ABSOLUTE COUNT: 3.6 10*9/L (ref 2.0–7.5)
NEUTROPHILS RELATIVE PERCENT: 65.5 %
PLATELET COUNT: 202 10*9/L (ref 150–440)
RED BLOOD CELL COUNT: 4.47 10*12/L (ref 4.00–5.20)
RED CELL DISTRIBUTION WIDTH: 13.6 % (ref 12.0–15.0)
WBC ADJUSTED: 5.5 10*9/L (ref 4.5–11.0)

## 2018-02-10 LAB — RED BLOOD CELL COUNT: Lab: 4.47

## 2018-02-10 LAB — GAMMA GLUTAMYL TRANSFERASE: Gamma glutamyl transferase:CCnc:Pt:Ser/Plas:Qn:: 60 — ABNORMAL HIGH

## 2018-02-10 LAB — PROTIME: Lab: 10.7

## 2018-02-10 LAB — AFP-TUMOR MARKER: Alpha-1-Fetoprotein.tumor marker:MCnc:Pt:Ser/Plas:Qn:: 6

## 2018-02-10 NOTE — Unmapped (Signed)
[facility]    [name], M.D.  Professor of Medicine  Director, [facility]  [place] [facility] [place] [facility] [place]    [phone]    [name]    Referring MD: Midge Minium, MD    Chief complaint: Office follow-up for elevation of liver enzymes. Hospitalization (08/2016) for acute hepatitis of uncertain etiology, presumed autoimmune hepatitis treated with prednisone and mycophenylate. DRUG FEVER ASSOCIATED WITH AZATHIOPRINE. STEROIDS STOPPED 07/2017. CONTINUES ON CELLCEPT 1 GRAM BID FOR IMMUNOSUPPRESSION    Interval history: Patient feels completely well. She exercises daily.  She denies fever, cough, sob, N/V, abdominal pain, chest pain, or skin rash. Eye symptoms have resolved. All prednisone stopped as of 07/2017. She has been maintained on mycophenylate 1gram bid.     Present illness:  Patient is a 57 y.o. Caucasian female who was hospitalized at [facility] between approximately February 12 -  August 29, 2016 with acute hepatitis. The patient had about 10 weeks of abnormal liver tests documented by her primary physician and her primary gastroenterologist. This was preceded by a short flu-like illness characterized by fatigue, muscle aches, and low-grade fever. All of the symptoms have completely remained resolved since her  Visit 09/10/2016. However she was left with elevated liver tests and had an extensive workup as an outpatient and subsequently as an inpatient at [facility] to further define the etiology of her liver disease.  Additional details of her history and prior outpatient evaluation as well as her inpatient evaluation are available from our initial consultation note and follow-up notes while she was hospitalized.       After review of the liver biopsy and all serologic data thus far which has been unrevealing, it was felt that a therapeutic trial of prednisone for presumed seronegative autoimmune hepatitis was warranted. Prednisone  40 mg of daily started on 08/29/16. She had a good biochemical response with marked improvement in ALT, INR, including a slow but steady decrease in Total/direct bilirubin.     Patient had an extensive serological evaluation which was not revealing:  1. Antinuclear antibody and anti-smooth muscle antibody were negative. Immunoglobulin G was not elevated (1061)  2. Viral serologies including acute hepatitis A, hepatitis B, hepatitis C, HCV RNA, EBV PCR, CMV PCR, HHV-6 were all negative.  3. Liver ultrasound demonstrated patent vasculature with a nodular-appearing heterogeneous liver.  4. Percutaneous liver biopsy 08/2016 at [facility]:    A: Liver, core biopsy   - Severe active hepatitis with confluent areas of periportal hepatocellular necrosis (approximately 10-20% of sampled parenchyma) (see comment)   - Trichrome stain demonstrates at least periportal fibrosis and is suspicious for bridging fibrosis     Potential etiologies to consider include, but are not limited to, infection, drug/toxin-induced liver injury, seronegative autoimmune hepatitis, and metabolic disorders. The trichrome stain findings suggest that this process is at least subacute in duration.      Liver biopsy slides were personally reviewed by Dr. Sharon Mt  with the pathologist.  No viral inclusions were noted and only few plasma cells were visualized in the liver biopsy which was an adequate specimen. No cytopathic effect. EBV immunostains were negative. Fe in hepatocytes only grade 1/4    5.  Evaluation for Zoster: Dr. Foy Guadalajara received phone call from patient's primary GI MD that patient's Zoster IgM from 09/05/2016 was positive with titer 1.17 approximately one week after starting prednisone. Review of her record though revealed this test to have been negative on 08/29/2016.  Dr.  Sharon Mt discussed these findings with Dr. Lytle Butte and the decision was made to proceed with patient starting Valtrex 500 mg bid.  She was seen by Dr. 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   Recommendations from ID: Continue valacyclovir 500 mg bid.  Advised readdressing stopping Valtrex if her repeat IgM is negative or once she is on <15 mg prednisone daily.  Valtrex was discontinued last week.  Bactrim prophylaxis while prednisone >15 mg q 24. 1 DS tablet M/W/F. The following vaccines were recommended for her to obtain under the care of her PCP: Twinrix, PCV13, and PPSV23. She should have these performed by her PCP when prednisone dose is  <20 mg daily.     Immunosuppressive history:  Patient was started on azathioprine for steroid sparing benefits in early April.  After 2 weeks patient developed fevers. ID workup negative during hospitalization.  Azathioprine was held during that hospitalization and fevers resolved. When she restarted azathioprine as outpatient, immediately had high fevers and shaking chills.  This rechallenge was highly suggestive of drug fever from azathioprine.    On 5/12 patient was started on CellCept 500mg  bid and continued on prednisone 15 mg daily.  On 12/03/16, liver enzymes remained elevated. Cellcept was increased to 1000mg  BID on 12/04/16. Prednisone tapered to off as of 07/2017.     10 sys ROS otherwise negative.     Past medical history:  Patient has generally been healthy until the current hepatitis began in December 2017.  1. No history of diabetes, coronary artery disease, hypertension, or lung disease.  2. No history of other autoimmune systemic diseases  3. FIBROSCAN 11/05/16: 17.7 kps c/w stage F4 (Patient ate breakfast 3 hours prior).  4. MRI 10/2017: HEPATOBILIARY: Nodular liver contour compatible with history of cirrhosis and similar to prior. Again seen is a geographic region of T1 hypointensity and corresponding slight T2 hyperintensity in the posterior right hepatic lobe which is overall similar in extent when compared to prior imaging. This region demonstrates progressive postcontrast enhancement as before. Overall similar degree of capsular retraction most pronounced along the right anterior margin of the liver (13:29). No discrete early arterially enhancing or washout liver lesion identified. No biliary ductal dilatation. Similar appearance and position of the borderline hydropic gallbladder.  PANCREAS: Pancreas divisum. No ductal dilatation.    Allergies   Allergen Reactions   ??? Azathioprine Other (See Comments)     Fever       Current Outpatient Medications   Medication Sig Dispense Refill   ??? mycophenolate (CELLCEPT) 500 mg tablet Take 2 tablets (1,000 mg total) by mouth Two (2) times a day. 120 tablet 5   ??? ketoconazole (NIZORAL) 2 % cream Apply 1 application topically Two (2) times a day. To affected areas as directed (Patient not taking: Reported on 11/02/2017) 60 g 1     No current facility-administered medications for this visit.      Social history: The patient is married. She works as a Materials engineer. They have traveled extensively.   She does not drink any alcohol. She does not smoke cigarettes.    Family history: Negative for liver disease or liver cancer. Negative for autoimmune systemic diseases.    Physical Examination:    BP 127/79  - Pulse 77  - Temp 36.9 ??C (98.4 ??F)  - Resp 20  - Ht 167.6 cm (5' 6)  - Wt 69.9 kg (154 lb 3.2 oz)  -  SpO2 99%  - BMI 24.89 kg/m??   General: Extremely Pleasant, WD, WN Caucasian female. NAD noted,   HEENT: Sclera anicteric, not injected,  no temporal muscle loss, oropharynx is negative  NECK: No thyromegaly or lymphadenopathy, No carotid bruits  Chest: Clear to auscultation and percussion  Heart: S1, S2, RR, No murmurs  Abdomen: Soft, non-tender, non-distended, no hepatosplenomegaly, no masses appreciated, no ascites  Skin: No spider angiomata, No rashes  Extremities: Without pedal edema, + palmar erythema  Neuro: Grossly intact, No focal deficits, no asterixis.    Laboratory Studies:     Results for orders placed or performed in visit on 02/10/18   Comprehensive Metabolic Panel   Result Value Ref Range    Sodium 138 135 - 145 mmol/L    Potassium 4.6 3.5 - 5.0 mmol/L    Chloride 103 98 - 107 mmol/L    CO2 27.0 22.0 - 30.0 mmol/L    Anion Gap 8 (L) 9 - 15 mmol/L    BUN 17 7 - 21 mg/dL    Creatinine 3.66 4.40 - 1.00 mg/dL    BUN/Creatinine Ratio 25     EGFR CKD-EPI Non-African American, Female >90 >=60 mL/min/1.74m2    EGFR CKD-EPI African American, Female >90 >=60 mL/min/1.34m2    Glucose 101 65 - 179 mg/dL    Calcium 34.7 8.5 - 42.5 mg/dL    Albumin 4.7 3.5 - 5.0 g/dL    Total Protein 7.6 6.5 - 8.3 g/dL    Total Bilirubin 0.7 0.0 - 1.2 mg/dL    AST 40 17 - 47 U/L    ALT 27 13 - 69 U/L    Alkaline Phosphatase 63 38 - 126 U/L   AFP tumor marker   Result Value Ref Range    AFP-Tumor Marker 6 <8 ng/mL   PT-INR   Result Value Ref Range    PT 10.7 10.2 - 12.8 sec    INR 0.94    Gamma GT (GGT)   Result Value Ref Range    GGT 60 (H) 11 - 48 U/L   CBC w/ Differential   Result Value Ref Range    WBC 5.5 4.5 - 11.0 10*9/L    RBC 4.47 4.00 - 5.20 10*12/L    HGB 13.3 12.0 - 16.0 g/dL    HCT 95.6 38.7 - 56.4 %    MCV 94.0 80.0 - 100.0 fL    MCH 29.8 26.0 - 34.0 pg    MCHC 31.7 31.0 - 37.0 g/dL    RDW 33.2 95.1 - 88.4 %    MPV 7.6 7.0 - 10.0 fL    Platelet 202 150 - 440 10*9/L    Neutrophils % 65.5 %    Lymphocytes % 24.8 %    Monocytes % 6.0 %    Eosinophils % 1.8 %    Basophils % 0.6 %    Absolute Neutrophils 3.6 2.0 - 7.5 10*9/L    Absolute Lymphocytes 1.4 (L) 1.5 - 5.0 10*9/L    Absolute Monocytes 0.3 0.2 - 0.8 10*9/L    Absolute Eosinophils 0.1 0.0 - 0.4 10*9/L    Absolute Basophils 0.0 0.0 - 0.1 10*9/L    Large Unstained Cells 1 0 - 4 %       Impression:  1. Seronegative autoimmune hepatitis based on response to immunosuppressive medication. The patient presented with severe hepatitis as evidenced by hyperbilirubinemia, elevation of her INR, decreased albumin, and results from her liver biopsy. Patient was started on a therapeutic trial  of prednisone for the reasons discussed above. After only 4 days of immunosuppression, her liver enzymes (ALT)  that had been regularly elevated above 1000 decreased substantially.  This was highly suggestive of a positive effect of the prednisone and was c/w  presumed ANA-negative/SMA-negative autoimmune hepatitis.Labs showed continued normalization of bilirubin and INR, although she remained with mildly elevated ALT in the 40-45 range. CellCept was added on 5/12 and increased to 1000mg  BID on 12/04/16.Steroids tapered to off as of 07/2017.  We will maintain current level of CellCept.  ALT has remained minimally abnormal since D/C of steroids without flaring. Patient feels completely well.     2. Cirrhosis, well compensated:  FIBROSCAN suggestive of cirrhosis and MRI suggestive of nodular contour with focal fibrosis.  Liver biopsy during acute episode did demonstrate possible bridging fibrosis. Inflammation now resolved.Defer repeat liver biopsy but may reconsider after prolonged remission to reevaluate, not sure it will change management at this point. Repeat MRI at next visit.    3. HCC surveillance: For presumed cirrhosis as described above. 10/2017-MRI with evidence of cirrhosis. No evidence of mass. Repeat at next visit in [place].      4. Drug fevers from Azathioprine: resolved, no recurrent fever.    5. Mild thrombocytopenia: Resolved. Normal size spleen.     4. Twinrix x 3 completed.     5. Eye symptoms: Felt to be steroid-related,  Now resolved as steroid dose decreased    6. Vaccinations: Patient will discuss Pneumovax and Shingles vaccine with her PCP.      RTC 12 weeks with interval labs to assess immunosuppression response and MRI same say.         [name], M.D.  Professor of Medicine  Director, [facility]  [place] [facility] [place] at [place]    [phone]

## 2018-02-10 NOTE — Unmapped (Signed)
Autoimmune hepatitis with evidence of cirrhosis. Doing well on mycophenylate. Continue this medication indefinitely. Return to office in 3 months with MRI same day.

## 2018-02-14 IMAGING — MG MM DIGITAL SCREENING BILAT W/ TOMO W/ CAD
9 of 12 series · 9 of 28 positions shown · non-contrast
Comparison: Previous exam(s).

CLINICAL DATA: Screening. Status post excisional biopsy for
papilloma of the right breast.

EXAM:
2D DIGITAL SCREENING BILATERAL MAMMOGRAM WITH CAD AND ADJUNCT TOMO

[R MLO]
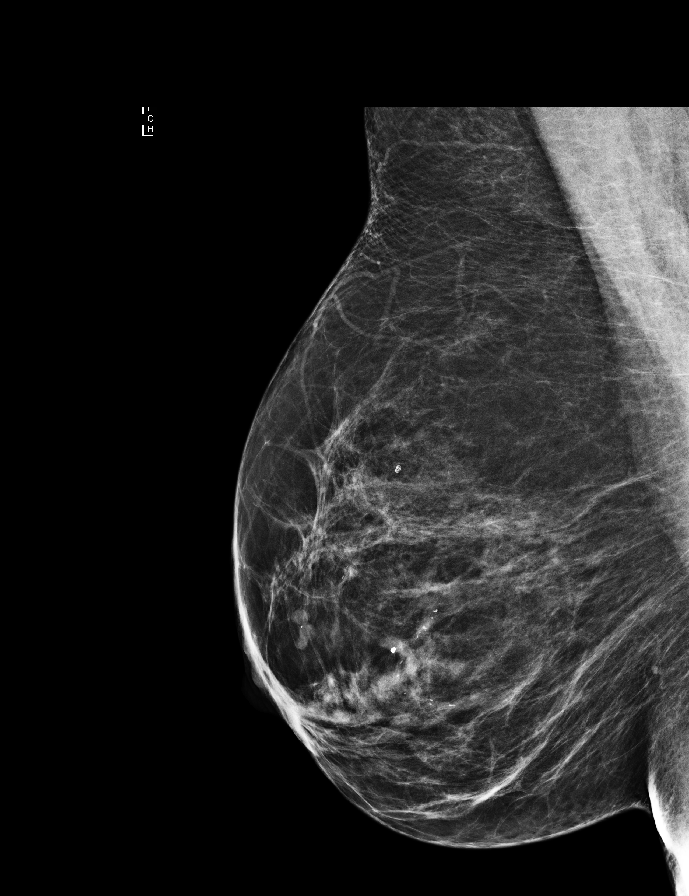

[L MLO synth-2D]
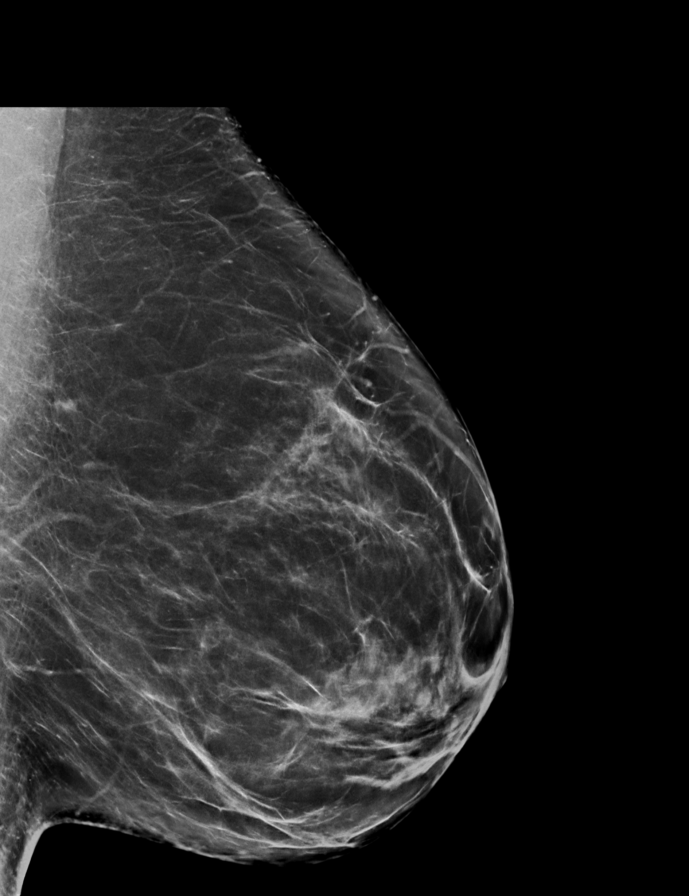

[R CC]
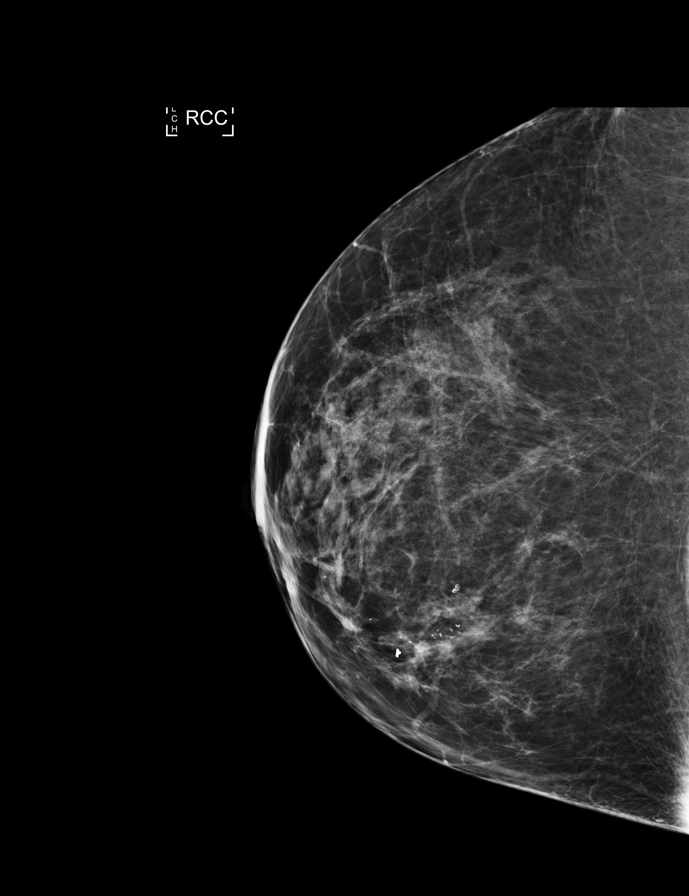

[L MLO]
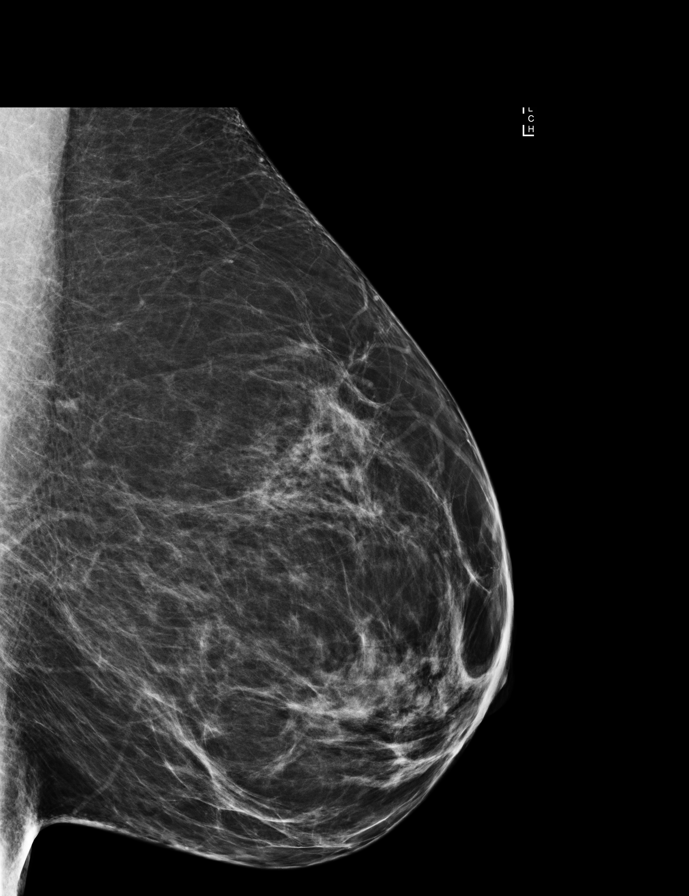

[L CC synth-2D]
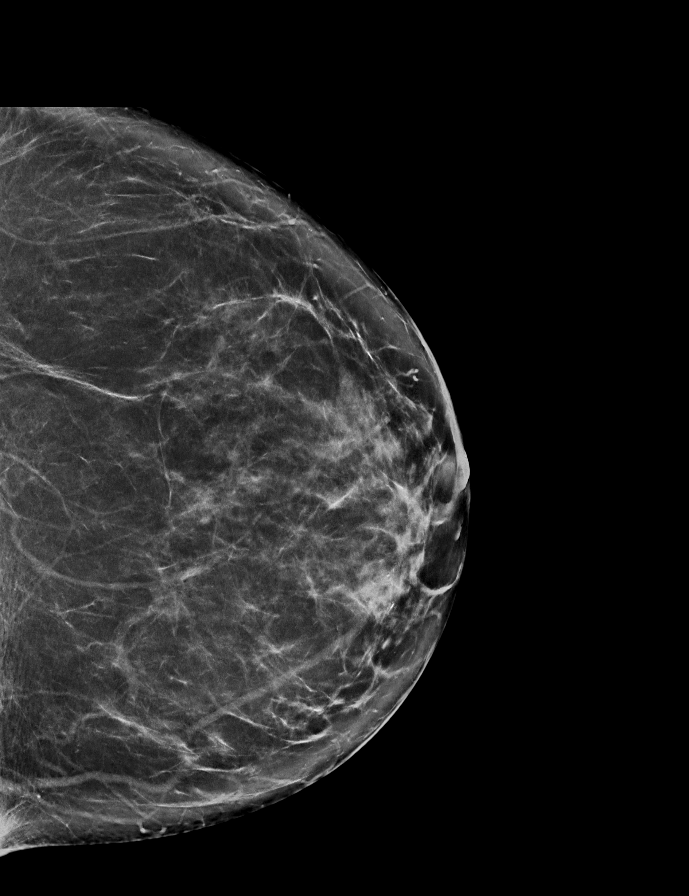

[R MLO synth-2D]
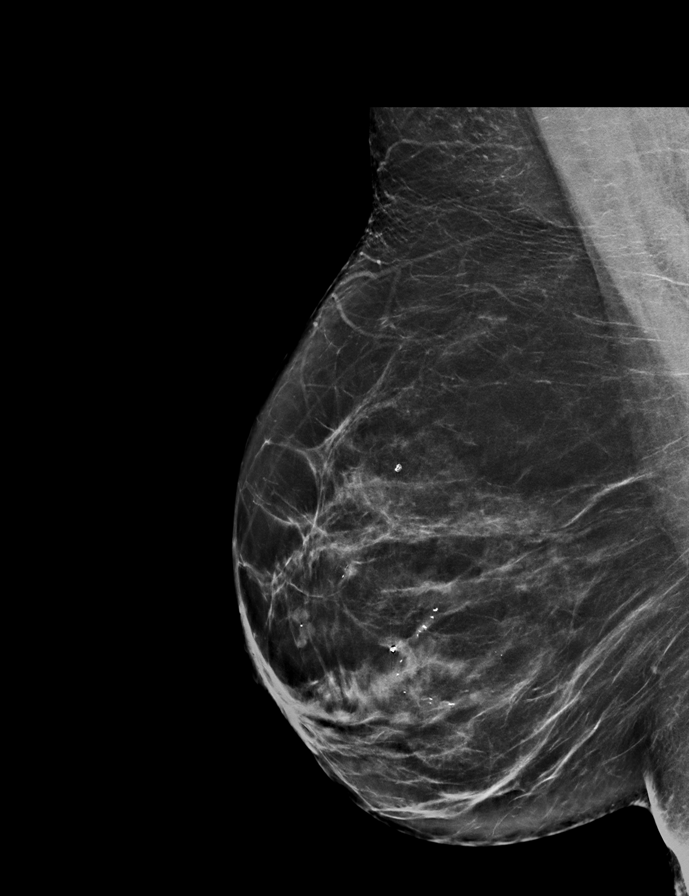

[R CC synth-2D]
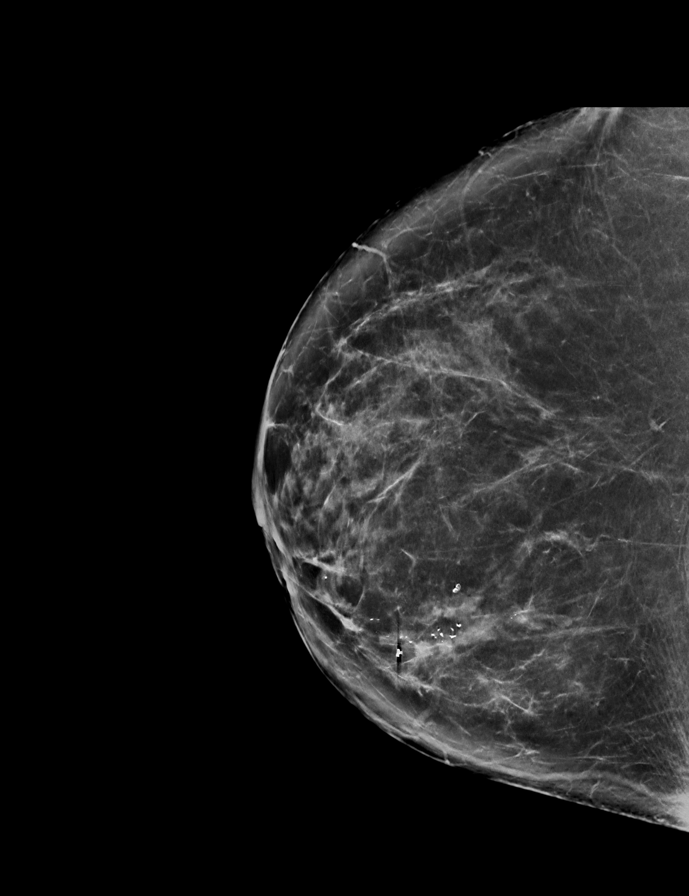

[L CC]
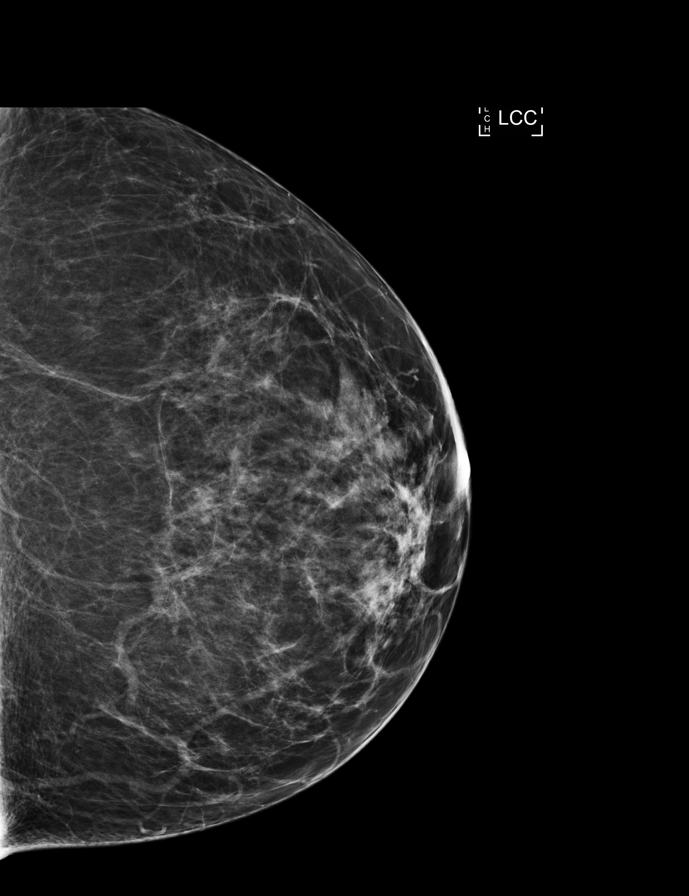

[R CC tomo · tomo slice 40/79.0]
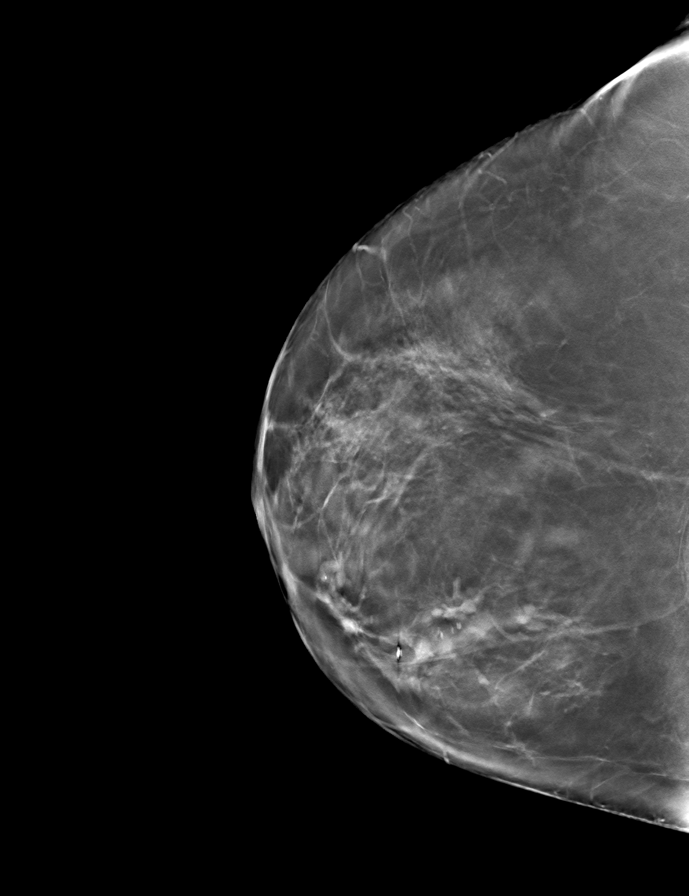

[9 of 28 positions shown; findings below may reference images not displayed]

ACR Breast Density Category b: There are scattered areas of
fibroglandular density.
FINDINGS: There are no findings suspicious for malignancy. Postsurgical
changes in the right breast are stable. Images were processed with
CAD.
IMPRESSION: No mammographic evidence of malignancy. A result letter of this
screening mammogram will be mailed directly to the patient.

RECOMMENDATION:
Screening mammogram in one year. (Code:[id number])

BI-RADS CATEGORY  1: Negative.

## 2018-02-15 NOTE — Unmapped (Signed)
HE SPEED Outpatient Study Visit:    This is the patient's (SID: [id number]) first assessment of their enrollment visit.    The patient was identified via chart review     Their inclusion/exclusion criteria were evaluated by Vanessa Ralphs.     Their ability to provide consent was assessed by Vanessa Ralphs and they were deemed to have the decisional capacity to provide consent.    The patient provided written consent on 02/10/2018 at 9:30 am.    The patient was presented the informed consent form by the study coordinator and the patient was agreeable to participate in the study with all questions, if applicable, having been answered to their satisfaction.    Clinical information as follows:    Etiology of cirrhosis: autoimmune hepatitis    BMI: 24.89    MELD-Na score: 6 at 02/10/2018  9:56 AM  MELD score: 6 at 02/10/2018  9:56 AM  Calculated from:  Serum Creatinine: 0.67 mg/dL (Rounded to 1 mg/dL) at 0/45/4098  1:19 AM  Serum Sodium: 138 mmol/L (Rounded to 137 mmol/L) at 02/10/2018  9:56 AM  Total Bilirubin: 0.7 mg/dL (Rounded to 1 mg/dL) at 1/47/8295  6:21 AM  INR(ratio): 0.94 (Rounded to 1) at 02/10/2018  9:56 AM  Age: 57 years

## 2018-02-17 NOTE — Unmapped (Signed)
[facility] Refill and Clinical Coordination Note  Medication(s): MYCOPHENOLATE    Margaret Berger, DOB: May 26, 1961  Phone: [phone] (home) [phone] (work), Alternate phone contact: N/A  Shipping address: [address] [place], [place] [address]  Phone or address changes today?: No  All above HIPAA information verified.  Insurance changes? No    Completed refill and clinical call assessment today to schedule patient's medication shipment from the [facility] [phone]).      MEDICATION RECONCILIATION    Confirmed the medication and dosage are correct and have not changed: Yes, regimen is correct and unchanged.    Were there any changes to your medication(s) in the past month:  No, there are no changes reported at this time.    ADHERENCE    Is this medicine transplant or covered by Medicare Part B? No.NOT PART B    Did you miss any doses in the past 4 weeks? No missed doses reported.  Adherence counseling provided? Not needed     SIDE EFFECT MANAGEMENT    Are you tolerating your medication?:  Margaret Berger reports tolerating the medication.  Side effect management discussed: None      Therapy is appropriate and should be continued.    Evidence of clinical benefit: See Epic note from 02/10/18      FINANCIAL/SHIPPING    Delivery Scheduled: Yes, Expected medication delivery date: 03/05/18 VIA UPS   Additional medications refilled: No additional medications/refills needed at this time.    The patient will receive an FSI print out for each medication shipped and additional FDA Medication Guides as required.  Patient education from [place] or Robet Leu may also be included in the shipment.    Margaret Berger did not have any additional questions at this time.    Delivery address validated in FSI scheduling system: Yes, address listed above is correct.      We will follow up with patient monthly for standard refill processing and delivery.      Thank you,  Thad Ranger   [facility] Specialty Pharmacist

## 2018-03-01 MED ORDER — MYCOPHENOLATE MOFETIL 500 MG TABLET
ORAL | 5 refills | 0 days | Status: CP
Start: 2018-03-01 — End: 2018-08-16
  Filled 2018-03-04: qty 120, 30d supply, fill #0

## 2018-03-04 MED FILL — MYCOPHENOLATE MOFETIL 500 MG TABLET: 30 days supply | Qty: 120 | Fill #0 | Status: AC

## 2018-03-24 NOTE — Unmapped (Signed)
[facility] Refill Coordination Note  Specialty Medication(s): MYCOPHENOLATE 500MG   Additional Medications shipped:      Margaret Berger, DOB: [birth year]  Phone: [phone] (home) [phone] (work), Alternate phone contact: N/A  Phone or address changes today?: No  All above HIPAA information was verified with patient.  Shipping Address: [id number]  [place] [address]   Insurance changes? No    Completed refill call assessment today to schedule patient's medication shipment from the [facility] [phone]).      Confirmed the medication and dosage are correct and have not changed: Yes, regimen is correct and unchanged.    Confirmed patient started or stopped the following medications in the past month:  No, there are no changes reported at this time.    Are you tolerating your medication?:  Margaret Berger reports tolerating the medication.    ADHERENCE    (Below is required for Medicare Part B or Transplant patients only - per drug):   How many tablets were dispensed last month: 120 TAB  Patient currently has 40 TABLETS remaining.    Did you miss any doses in the past 4 weeks? No missed doses reported.    FINANCIAL/SHIPPING    Delivery Scheduled: Yes, Expected medication delivery date: 091919     The patient will receive a drug information handout for each medication shipped and additional FDA Medication Guides as required.      Margaret Berger did not have any additional questions at this time.    Delivery address validated in Epic.    We will follow up with patient monthly for standard refill processing and delivery.      Thank you,  Antonietta Barcelona   [facility] Specialty Technician

## 2018-03-31 MED FILL — MYCOPHENOLATE MOFETIL 500 MG TABLET: ORAL | 30 days supply | Qty: 120 | Fill #1

## 2018-03-31 MED FILL — MYCOPHENOLATE MOFETIL 500 MG TABLET: 30 days supply | Qty: 120 | Fill #1 | Status: AC

## 2018-04-28 ENCOUNTER — Ambulatory Visit
Admit: 2018-04-28 | Discharge: 2018-04-28 | Payer: PRIVATE HEALTH INSURANCE | Attending: Gastroenterology | Primary: Gastroenterology

## 2018-04-28 ENCOUNTER — Encounter: Admit: 2018-04-28 | Discharge: 2018-04-28 | Payer: PRIVATE HEALTH INSURANCE

## 2018-04-28 DIAGNOSIS — K769 Liver disease, unspecified: Secondary | ICD-10-CM | POA: Diagnosis not present

## 2018-04-28 DIAGNOSIS — Z6824 Body mass index (BMI) 24.0-24.9, adult: Secondary | ICD-10-CM | POA: Diagnosis not present

## 2018-04-28 DIAGNOSIS — R932 Abnormal findings on diagnostic imaging of liver and biliary tract: Secondary | ICD-10-CM | POA: Diagnosis not present

## 2018-04-28 DIAGNOSIS — K7460 Unspecified cirrhosis of liver: Secondary | ICD-10-CM | POA: Diagnosis not present

## 2018-04-28 DIAGNOSIS — K754 Autoimmune hepatitis: Secondary | ICD-10-CM | POA: Diagnosis not present

## 2018-04-28 LAB — CBC W/ AUTO DIFF
BASOPHILS ABSOLUTE COUNT: 0 10*9/L (ref 0.0–0.1)
BASOPHILS RELATIVE PERCENT: 0.5 %
EOSINOPHILS ABSOLUTE COUNT: 0.2 10*9/L (ref 0.0–0.4)
EOSINOPHILS RELATIVE PERCENT: 3.8 %
HEMATOCRIT: 42 % (ref 36.0–46.0)
HEMOGLOBIN: 13.3 g/dL (ref 12.0–16.0)
LARGE UNSTAINED CELLS: 2 % (ref 0–4)
LYMPHOCYTES ABSOLUTE COUNT: 1.7 10*9/L (ref 1.5–5.0)
LYMPHOCYTES RELATIVE PERCENT: 31.8 %
MEAN CORPUSCULAR HEMOGLOBIN CONC: 31.7 g/dL (ref 31.0–37.0)
MEAN CORPUSCULAR HEMOGLOBIN: 30.5 pg (ref 26.0–34.0)
MEAN CORPUSCULAR VOLUME: 96.1 fL (ref 80.0–100.0)
MEAN PLATELET VOLUME: 9.7 fL (ref 7.0–10.0)
MONOCYTES ABSOLUTE COUNT: 0.3 10*9/L (ref 0.2–0.8)
MONOCYTES RELATIVE PERCENT: 5.8 %
NEUTROPHILS ABSOLUTE COUNT: 3 10*9/L (ref 2.0–7.5)
PLATELET COUNT: 231 10*9/L (ref 150–440)
RED CELL DISTRIBUTION WIDTH: 13.6 % (ref 12.0–15.0)
WBC ADJUSTED: 5.3 10*9/L (ref 4.5–11.0)

## 2018-04-28 LAB — INR: Lab: 0.92

## 2018-04-28 LAB — EOSINOPHILS ABSOLUTE COUNT: Lab: 0.2

## 2018-04-28 LAB — COMPREHENSIVE METABOLIC PANEL
ALBUMIN: 4.8 g/dL (ref 3.5–5.0)
ALKALINE PHOSPHATASE: 56 U/L (ref 38–126)
AST (SGOT): 39 U/L — ABNORMAL HIGH (ref 14–38)
BILIRUBIN TOTAL: 0.6 mg/dL (ref 0.0–1.2)
BLOOD UREA NITROGEN: 14 mg/dL (ref 7–21)
BUN / CREAT RATIO: 22
CALCIUM: 10.2 mg/dL (ref 8.5–10.2)
CO2: 27 mmol/L (ref 22.0–30.0)
CREATININE: 0.64 mg/dL (ref 0.60–1.00)
EGFR CKD-EPI AA FEMALE: >90 mL/min/{1.73_m2} (ref >=60)
EGFR CKD-EPI NON-AA FEMALE: >90 mL/min/{1.73_m2} (ref >=60)
GLUCOSE RANDOM: 93 mg/dL (ref 65–179)
POTASSIUM: 4.2 mmol/L (ref 3.5–5.0)
PROTEIN TOTAL: 7.6 g/dL (ref 6.5–8.3)
SODIUM: 139 mmol/L (ref 135–145)

## 2018-04-28 LAB — BILIRUBIN TOTAL: Bilirubin:MCnc:Pt:Ser/Plas:Qn:: 0.6

## 2018-04-28 LAB — AFP-TUMOR MARKER: Alpha-1-Fetoprotein.tumor marker:MCnc:Pt:Ser/Plas:Qn:: 4

## 2018-04-28 NOTE — Unmapped (Signed)
HE SPEED Outpatient Study Visit:  ??  This is the patient's (PID: [id number]) one time follow-up study visit.   ??  Please see details of consent in note from 02/15/18 (consent obtained on 02/10/18).  ??  Clinical information as follows:  ??  Etiology of cirrhosis: autoimmune hepatitis  ??  BMI: 24.79

## 2018-04-28 NOTE — Unmapped (Signed)
[facility] LIVER CENTER    Margaret Berger, M.D.  Professor of Medicine  Director, [facility]  [place] [facility] [place] [facility] [place]    [phone]    Margaret Berger    Referring MD: Midge Minium, MD    Chief complaint: Office follow-up for elevation of liver enzymes. Hospitalization (08/2016) for acute hepatitis of uncertain etiology, presumed autoimmune hepatitis treated with prednisone and mycophenylate. DRUG FEVER ASSOCIATED WITH AZATHIOPRINE. STEROIDS STOPPED 07/2017. CONTINUES ON CELLCEPT 1 GRAM BID FOR IMMUNOSUPPRESSION    Interval history: Patient continues to feel well. She exercises daily.  She denies fever, cough, sob, N/V, abdominal pain, chest pain, or skin rash. Eye symptoms have resolved. All prednisone stopped as of 07/2017. She has been maintained on mycophenylate 1gram bid. No new health related concerns.    Present illness:  Patient is a 57 y.o. Caucasian female who was hospitalized at [facility] between approximately February 12 -  August 29, 2016 with acute hepatitis. The patient had about 10 weeks of abnormal liver tests documented by her primary physician and her primary gastroenterologist. This was preceded by a short flu-like illness characterized by fatigue, muscle aches, and low-grade fever. All of the symptoms have completely remained resolved since her  Visit 09/10/2016. However she was left with elevated liver tests and had an extensive workup as an outpatient and subsequently as an inpatient at [facility] to further define the etiology of her liver disease.  Additional details of her history and prior outpatient evaluation as well as her inpatient evaluation are available from our initial consultation note and follow-up notes while she was hospitalized.       After review of the liver biopsy and all serologic data thus far which has been unrevealing, it was felt that a therapeutic trial of prednisone for presumed seronegative autoimmune hepatitis was warranted. Prednisone  40 mg of daily started on 08/29/16. She had a good biochemical response with marked improvement in ALT, INR, including a slow but steady decrease in Total/direct bilirubin.     Patient had an extensive serological evaluation which was not revealing:  1. Antinuclear antibody and anti-smooth muscle antibody were negative. Immunoglobulin G was not elevated (1061)  2. Viral serologies including acute hepatitis A, hepatitis B, hepatitis C, HCV RNA, EBV PCR, CMV PCR, HHV-6 were all negative.  3. Liver ultrasound demonstrated patent vasculature with a nodular-appearing heterogeneous liver.  4. Percutaneous liver biopsy 08/2016 at [facility]:    A: Liver, core biopsy   - Severe active hepatitis with confluent areas of periportal hepatocellular necrosis (approximately 10-20% of sampled parenchyma) (see comment)   - Trichrome stain demonstrates at least periportal fibrosis and is suspicious for bridging fibrosis     Potential etiologies to consider include, but are not limited to, infection, drug/toxin-induced liver injury, seronegative autoimmune hepatitis, and metabolic disorders. The trichrome stain findings suggest that this process is at least subacute in duration.      Liver biopsy slides were personally reviewed by Dr. Sharon Mt  with the pathologist.  No viral inclusions were noted and only few plasma cells were visualized in the liver biopsy which was an adequate specimen. No cytopathic effect. EBV immunostains were negative. Fe in hepatocytes only grade 1/4    5.  Evaluation for Zoster: Dr. Foy Guadalajara received phone call from patient's primary GI MD that patient's Zoster IgM from 09/05/2016 was positive with titer 1.17 approximately one week after starting prednisone. Review of her record though revealed this test to have been  negative on 08/29/2016.  Dr. Sharon Mt discussed these findings with Dr. Lytle Butte and the decision was made to proceed with patient starting Valtrex 500 mg bid.  She was seen by Dr. 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   Recommendations from ID: Continue valacyclovir 500 mg bid.  Advised readdressing stopping Valtrex if her repeat IgM is negative or once she is on <15 mg prednisone daily.  Valtrex was discontinued last week.  Bactrim prophylaxis while prednisone >15 mg q 24. 1 DS tablet M/W/F. The following vaccines were recommended for her to obtain under the care of her PCP: Twinrix, PCV13, and PPSV23. She should have these performed by her PCP when prednisone dose is  <20 mg daily.     Immunosuppressive history:  Patient was started on azathioprine for steroid sparing benefits in early April.  After 2 weeks patient developed fevers. ID workup negative during hospitalization.  Azathioprine was held during that hospitalization and fevers resolved. When she restarted azathioprine as outpatient, immediately had high fevers and shaking chills.  This rechallenge was highly suggestive of drug fever from azathioprine.    On 5/12 patient was started on CellCept 500mg  bid and continued on prednisone 15 mg daily.  On 12/03/16, liver enzymes remained elevated. Cellcept was increased to 1000mg  BID on 12/04/16. Prednisone tapered to off as of 07/2017.     10 sys ROS otherwise negative.     Past medical history:  Patient has generally been healthy until the current hepatitis began in December 2017.  1. No history of diabetes, coronary artery disease, hypertension, or lung disease.  2. No history of other autoimmune systemic diseases  3. FIBROSCAN 11/05/16: 17.7 kps c/w stage F4 (Patient ate breakfast 3 hours prior).  4. MRI 10/2017: HEPATOBILIARY: Nodular liver contour compatible with history of cirrhosis and similar to prior. Again seen is a geographic region of T1 hypointensity and corresponding slight T2 hyperintensity in the posterior right hepatic lobe which is overall similar in extent when compared to prior imaging. This region demonstrates progressive postcontrast enhancement as before. Overall similar degree of capsular retraction most pronounced along the right anterior margin of the liver (13:29). No discrete early arterially enhancing or washout liver lesion identified. No biliary ductal dilatation. Similar appearance and position of the borderline hydropic gallbladder.  PANCREAS: Pancreas divisum. No ductal dilatation.    5. MRI 04/2018: PENDING    Allergies   Allergen Reactions   ??? Azathioprine Other (See Comments)     Fever       Current Outpatient Medications   Medication Sig Dispense Refill   ??? mycophenolate (CELLCEPT) 500 mg tablet TAKE 2 TABLETS BY MOUTH TWICE DAILY WITH FOOD 120 each 5   ??? ketoconazole (NIZORAL) 2 % cream Apply 1 application topically Two (2) times a day. To affected areas as directed (Patient not taking: Reported on 11/02/2017) 60 g 1   ??? mycophenolate (CELLCEPT) 500 mg tablet Take 2 tablets (1,000 mg total) by mouth Two (2) times a day. (Patient not taking: Reported on 04/28/2018) 120 tablet 5     No current facility-administered medications for this visit.      Social history: The patient is married. She works as a Materials engineer. They have traveled extensively.   She does not drink any alcohol. She does not smoke cigarettes.    Family history: Negative for liver disease or liver cancer. Negative for autoimmune systemic diseases.  Physical Examination:    BP 122/71  - Pulse 68  - Temp 36.6 ??C (97.9 ??F)  - Resp 20  - Ht 167.6 cm (5' 6)  - Wt 69.7 kg (153 lb 9.6 oz)  - SpO2 97%  - BMI 24.79 kg/m??   General: Extremely Pleasant, WD, WN Caucasian female. NAD noted,   HEENT: Sclera anicteric, not injected,  no temporal muscle loss, oropharynx is negative  NECK: No thyromegaly or lymphadenopathy, No carotid bruits  Chest: Clear to auscultation and percussion  Heart: S1, S2, RR, No murmurs  Abdomen: Soft, non-tender, non-distended, no hepatosplenomegaly, no masses appreciated, no ascites  Skin: No spider angiomata, No rashes  Extremities: Without pedal edema, + palmar erythema  Neuro: Grossly intact, No focal deficits, no asterixis.    Laboratory Studies:     Results for orders placed or performed in visit on 02/10/18   Comprehensive Metabolic Panel   Result Value Ref Range    Sodium 138 135 - 145 mmol/L    Potassium 4.6 3.5 - 5.0 mmol/L    Chloride 103 98 - 107 mmol/L    CO2 27.0 22.0 - 30.0 mmol/L    Anion Gap 8 (L) 9 - 15 mmol/L    BUN 17 7 - 21 mg/dL    Creatinine 1.61 0.96 - 1.00 mg/dL    BUN/Creatinine Ratio 25     EGFR CKD-EPI Non-African American, Female >90 >=60 mL/min/1.102m2    EGFR CKD-EPI African American, Female >90 >=60 mL/min/1.26m2    Glucose 101 65 - 179 mg/dL    Calcium 04.5 8.5 - 40.9 mg/dL    Albumin 4.7 3.5 - 5.0 g/dL    Total Protein 7.6 6.5 - 8.3 g/dL    Total Bilirubin 0.7 0.0 - 1.2 mg/dL    AST 40 17 - 47 U/L    ALT 27 13 - 69 U/L    Alkaline Phosphatase 63 38 - 126 U/L   AFP tumor marker   Result Value Ref Range    AFP-Tumor Marker 6 <8 ng/mL   PT-INR   Result Value Ref Range    PT 10.7 10.2 - 12.8 sec    INR 0.94    Gamma GT (GGT)   Result Value Ref Range    GGT 60 (H) 11 - 48 U/L   CBC w/ Differential   Result Value Ref Range    WBC 5.5 4.5 - 11.0 10*9/L    RBC 4.47 4.00 - 5.20 10*12/L    HGB 13.3 12.0 - 16.0 g/dL    HCT 81.1 91.4 - 78.2 %    MCV 94.0 80.0 - 100.0 fL    MCH 29.8 26.0 - 34.0 pg    MCHC 31.7 31.0 - 37.0 g/dL    RDW 95.6 21.3 - 08.6 %    MPV 7.6 7.0 - 10.0 fL    Platelet 202 150 - 440 10*9/L    Neutrophils % 65.5 %    Lymphocytes % 24.8 %    Monocytes % 6.0 %    Eosinophils % 1.8 %    Basophils % 0.6 %    Absolute Neutrophils 3.6 2.0 - 7.5 10*9/L    Absolute Lymphocytes 1.4 (L) 1.5 - 5.0 10*9/L    Absolute Monocytes 0.3 0.2 - 0.8 10*9/L    Absolute Eosinophils 0.1 0.0 - 0.4 10*9/L    Absolute Basophils 0.0 0.0 - 0.1 10*9/L    Large Unstained Cells 1 0 - 4 %       Impression:  1. Seronegative autoimmune hepatitis based on response to immunosuppressive medication. The patient presented with severe hepatitis as evidenced by hyperbilirubinemia, elevation of her INR, decreased albumin, and results from her liver biopsy. Patient was started on a therapeutic trial of prednisone for the reasons discussed above. After only 4 days of immunosuppression, her liver enzymes (ALT)  that had been regularly elevated above 1000 decreased substantially.  This was highly suggestive of a positive effect of the prednisone and was c/w  presumed ANA-negative/SMA-negative autoimmune hepatitis.Labs showed continued normalization of bilirubin and INR, although she remained with mildly elevated ALT in the 40-45 range. CellCept was added on 5/12 and increased to 1000mg  BID on 12/04/16.Steroids tapered to off as of 07/2017.  We will maintain current level of CellCept.  ALT has remained essentially normal since D/C of steroids without flaring. Patient feels completely well.     2. Cirrhosis, well compensated:  FIBROSCAN suggestive of cirrhosis and MRI suggestive of nodular contour with focal fibrosis.  Liver biopsy during acute episode did demonstrate possible bridging fibrosis. Inflammation now resolved.Defer repeat liver biopsy but may reconsider after prolonged remission to reevaluate, not sure it will change management at this point. Repeat MRI at next visit.    3. HCC surveillance: For presumed cirrhosis as described above. 10/2017-MRI with evidence of cirrhosis. No evidence of mass. Repeat at next visit in [place].      4. Drug fevers from Azathioprine: resolved, no recurrent fever.    5. Mild thrombocytopenia: Resolved. Normal size spleen.     4. Twinrix x 3 completed.     5. Eye symptoms: Felt to be steroid-related,  Now resolved as steroid dose decreased    6. Vaccinations: Patient will discuss Pneumovax and Shingles vaccine with her PCP.      RTC 12 weeks with interval labs to assess immunosuppression response and MRI same say.         Margaret Berger, M.D.  Professor of Medicine  Director, [facility]  [place] [facility] [place] [facility] [place]    [phone]

## 2018-04-28 NOTE — Unmapped (Signed)
Doing well on current doses of immunosuppression. Continue indefinitely. We will check results of MRI and labs from today. Check with your primary physician about getting Pneumovax and Shingrix.

## 2018-04-30 NOTE — Unmapped (Signed)
[facility] Specialty Pharmacy Refill Coordination Note  Specialty Medication(s): Mycophenolate 500mg   Additional Medications shipped: none    Margaret Berger, DOB: April 30, 1961  Phone: [phone] (home) [phone] (work), Alternate phone contact: N/A  Phone or address changes today?: Yes  [address] [place] [address]  All above HIPAA information was verified with patient.  Shipping Address: [address]  [place] [address]   Insurance changes? No    Completed refill call assessment today to schedule patient's medication shipment from the [facility] [phone]).      Confirmed the medication and dosage are correct and have not changed: Yes, regimen is correct and unchanged.    Confirmed patient started or stopped the following medications in the past month:  No, there are no changes reported at this time.    Are you tolerating your medication?:  Margaret Berger reports tolerating the medication.    ADHERENCE    (Below is required for Medicare Part B or Transplant patients only - per drug):   How many tablets were dispensed last month: 120  Patient currently has 10 days of tablets remaining.    Did you miss any doses in the past 4 weeks? No missed doses reported.    FINANCIAL/SHIPPING    Delivery Scheduled: Yes, Expected medication delivery date: 05/05/18     Medication will be delivered via UPS to the prescription address in [facility].    The patient will receive a drug information handout for each medication shipped and additional FDA Medication Guides as required.      Margaret Berger did not have any additional questions at this time.    We will follow up with patient monthly for standard refill processing and delivery.      Thank you,  Tera Helper   [facility] Specialty Pharmacist

## 2018-05-04 MED FILL — MYCOPHENOLATE MOFETIL 500 MG TABLET: ORAL | 30 days supply | Qty: 120 | Fill #2

## 2018-05-04 MED FILL — MYCOPHENOLATE MOFETIL 500 MG TABLET: 30 days supply | Qty: 120 | Fill #2 | Status: AC

## 2018-05-12 ENCOUNTER — Telehealth: Payer: Self-pay | Admitting: Internal Medicine

## 2018-05-12 NOTE — Telephone Encounter (Signed)
Is pt due for these

## 2018-05-12 NOTE — Telephone Encounter (Signed)
Copied from [place] [id number]. Topic: Quick Communication - See Telephone Encounter >> May 12, 2018 10:14 AM Sheran Luz wrote: CRM for notification. See Telephone encounter for: 05/12/18.   Pt is requesting shingrix and pneumovax injections. Please advise.

## 2018-05-13 NOTE — Telephone Encounter (Signed)
Yes, she should receive both ,  She can make separate RN appts. Do not combine,  And do shingrx  p vax  shingrx at one month intervals

## 2018-05-18 NOTE — Telephone Encounter (Signed)
LMTCB. PEC may speak with pt.  

## 2018-05-26 NOTE — Telephone Encounter (Signed)
LMTCB. PEC may speak with pt.  

## 2018-05-31 NOTE — Unmapped (Signed)
[facility] Specialty Pharmacy Refill and Clinical Coordination Note  Medication(s): mycophenolate 500mg     Margaret Berger, DOB: [birth year]  Phone: [phone] (home) [phone] (work), Alternate phone contact: N/A  Shipping address: [address]  [place] [address]  Phone or address changes today?: No  All above HIPAA information verified.  Insurance changes? No    Completed refill and clinical call assessment today to schedule patient's medication shipment from the [facility] [phone]).      MEDICATION RECONCILIATION    Confirmed the medication and dosage are correct and have not changed: Yes, regimen is correct and unchanged.    Were there any changes to your medication(s) in the past month:  No, there are no changes reported at this time.    ADHERENCE    Is this medicine transplant or covered by Medicare Part B? No.    Did you miss any doses in the past 4 weeks? No missed doses reported.  Adherence counseling provided? Not needed     SIDE EFFECT MANAGEMENT    Are you tolerating your medication?:  Margaret Berger reports tolerating the medication.  Side effect management discussed: None      Therapy is appropriate and should be continued.    Evidence of clinical benefit: See Epic note from 04/28/18      FINANCIAL/SHIPPING    Delivery Scheduled: Yes, Expected medication delivery date: 06/03/18     Medication will be delivered via Next Day Courier to the prescription address in [facility].    Additional medications refilled: No additional medications/refills needed at this time.    The patient will receive a drug information handout for each medication shipped and additional FDA Medication Guides as required.      Margaret Berger did not have any additional questions at this time.    Delivery address confirmed in Epic.     We will follow up with patient monthly for standard refill processing and delivery.      Thank you,  Lupita Shutter   [facility] Specialty Pharmacist

## 2018-06-02 MED FILL — MYCOPHENOLATE MOFETIL 500 MG TABLET: ORAL | 30 days supply | Qty: 120 | Fill #3

## 2018-06-02 MED FILL — MYCOPHENOLATE MOFETIL 500 MG TABLET: 30 days supply | Qty: 120 | Fill #3 | Status: AC

## 2018-06-18 ENCOUNTER — Telehealth: Payer: Self-pay

## 2018-06-18 NOTE — Telephone Encounter (Signed)
Copied from [place] [id number]. Topic: Appointment Scheduling - Scheduling Inquiry for Clinic >> Jun 18, 2018  9:40 AM Ahmed Prima L wrote: Reason for CRM: Patient is requesting a shingles vaccine the week of December 16th, 2019. Please Advise

## 2018-06-22 NOTE — Telephone Encounter (Signed)
LMTCB. PEC may speak with pt. Need to schedule pt for a nurse visit for a shingles vaccine.

## 2018-06-23 NOTE — Unmapped (Signed)
[facility] Specialty Pharmacy Refill Coordination Note    Specialty Medication(s) to be Shipped:   General Specialty: mycophenolate 500mg     Other medication(s) to be shipped: none     [name], DOB: 1961/03/02  Phone: [phone] (home) [phone] (work)      All above HIPAA information was verified with patient.     Completed refill call assessment today to schedule patient's medication shipment from the [facility] [phone]).       Specialty medication(s) and dose(s) confirmed: Regimen is correct and unchanged.   Changes to medications: Kathrene reports no changes reported at this time.  Changes to insurance: No  Questions for the pharmacist: No    The patient will receive a drug information handout for each medication shipped and additional FDA Medication Guides as required.      DISEASE/MEDICATION-SPECIFIC INFORMATION        N/A    ADHERENCE              MEDICARE PART B DOCUMENTATION     Mycophenolate mofetil 500mg : Patient has 10 days worth of tablets on hand.    SHIPPING     Shipping address confirmed in Epic.     Delivery Scheduled: Yes, Expected medication delivery date: 06/28/18 via UPS or courier.     Medication will be delivered via Same Day Courier to the home address in Epic [facility].    [place] A Anacleto Batterman   [facility] Specialty Technician

## 2018-06-28 MED FILL — MYCOPHENOLATE MOFETIL 500 MG TABLET: 30 days supply | Qty: 120 | Fill #4 | Status: AC

## 2018-06-28 MED FILL — MYCOPHENOLATE MOFETIL 500 MG TABLET: ORAL | 30 days supply | Qty: 120 | Fill #4

## 2018-07-06 ENCOUNTER — Ambulatory Visit (INDEPENDENT_AMBULATORY_CARE_PROVIDER_SITE_OTHER): Payer: BLUE CROSS/BLUE SHIELD

## 2018-07-06 DIAGNOSIS — Z23 Encounter for immunization: Secondary | ICD-10-CM

## 2018-07-06 NOTE — Progress Notes (Signed)
Pt was seen today for Shingrix vaccine first dose. Given IM in the LD. Pt tolerated well and advised to get the second dose in 2-6 months.

## 2018-07-08 ENCOUNTER — Other Ambulatory Visit: Payer: Self-pay | Admitting: Internal Medicine

## 2018-07-08 DIAGNOSIS — Z1231 Encounter for screening mammogram for malignant neoplasm of breast: Secondary | ICD-10-CM

## 2018-07-16 NOTE — Unmapped (Signed)
[facility] Specialty Pharmacy Refill Coordination Note  Specialty Medication(s):Mycophenolate 500mg   Additional Medications shipped:      [name], DOB: 03-21-1961  Phone: [phone] (home) [phone] (work), Alternate phone contact: N/A  Phone or address changes today?: No  All above HIPAA information was verified with patient.  Shipping Address: [address]  [place] [address]   Insurance changes? No    Completed refill call assessment today to schedule patient's medication shipment from the [facility] [phone]).      Confirmed the medication and dosage are correct and have not changed: Yes, regimen is correct and unchanged.    Confirmed patient started or stopped the following medications in the past month:  No, there are no changes reported at this time.    Are you tolerating your medication?:  Debi reports tolerating the medication.    ADHERENCE    (Below is required for Medicare Part B or Transplant patients only - per drug):   How many tablets were dispensed last month: 120 tabs  Patient currently has 56 tabs remaining.    Did you miss any doses in the past 4 weeks? No missed doses reported.    FINANCIAL/SHIPPING    Delivery Scheduled: Yes, Expected medication delivery date: 011420     Medication will be delivered via Next Day Courier to the home address in [facility].    The patient will receive a drug information handout for each medication shipped and additional FDA Medication Guides as required.      Takila did not have any additional questions at this time.    We will follow up with patient monthly for standard refill processing and delivery.      Thank you,  Antonietta Barcelona   [facility] Specialty Technician

## 2018-07-26 MED FILL — MYCOPHENOLATE MOFETIL 500 MG TABLET: ORAL | 30 days supply | Qty: 120 | Fill #5

## 2018-07-26 MED FILL — MYCOPHENOLATE MOFETIL 500 MG TABLET: 30 days supply | Qty: 120 | Fill #5 | Status: AC

## 2018-08-16 MED ORDER — MYCOPHENOLATE MOFETIL 500 MG TABLET
ORAL | 5 refills | 0 days | Status: CP
Start: 2018-08-16 — End: 2019-02-23
  Filled 2018-08-23: qty 120, 30d supply, fill #0

## 2018-08-16 NOTE — Unmapped (Signed)
Mycophenolate

## 2018-08-16 NOTE — Unmapped (Signed)
[facility] Specialty Pharmacy Refill Coordination Note  Specialty Medication(s): Mycophenolate 500mg (faxed Dr)  Additional Medications shipped:      [name], DOB: [birth year]  Phone: [phone] (home) [phone] (work), Alternate phone contact: N/A  Phone or address changes today?: No  All above HIPAA information was verified with patient.  Shipping Address: [address]  [place] [address]   Insurance changes? No    Completed refill call assessment today to schedule patient's medication shipment from the [facility] [phone]).      Confirmed the medication and dosage are correct and have not changed: Yes, regimen is correct and unchanged.    Confirmed patient started or stopped the following medications in the past month:  No, there are no changes reported at this time.    Are you tolerating your medication?:  Melenda reports tolerating the medication.    ADHERENCE    (Below is required for Medicare Part B or Transplant patients only - per drug):   How many tablets were dispensed last month: 120 tabs  Patient currently has 10 days remaining.    Did you miss any doses in the past 4 weeks? No missed doses reported.    FINANCIAL/SHIPPING    Delivery Scheduled: Yes, Expected medication delivery date: 021120     Medication will be delivered via Next Day Courier to the home address in [place].    The patient will receive a drug information handout for each medication shipped and additional FDA Medication Guides as required.      Lashan did not have any additional questions at this time.    We will follow up with patient monthly for standard refill processing and delivery.      Thank you,  Antonietta Barcelona   [facility] Specialty Technician

## 2018-08-23 MED FILL — MYCOPHENOLATE MOFETIL 500 MG TABLET: 30 days supply | Qty: 120 | Fill #0 | Status: AC

## 2018-08-25 ENCOUNTER — Ambulatory Visit
Admit: 2018-08-25 | Discharge: 2018-08-26 | Payer: PRIVATE HEALTH INSURANCE | Attending: Gastroenterology | Primary: Gastroenterology

## 2018-08-25 DIAGNOSIS — Z6825 Body mass index (BMI) 25.0-25.9, adult: Secondary | ICD-10-CM | POA: Diagnosis not present

## 2018-08-25 DIAGNOSIS — K754 Autoimmune hepatitis: Secondary | ICD-10-CM | POA: Diagnosis not present

## 2018-08-25 LAB — CBC W/ AUTO DIFF
BASOPHILS ABSOLUTE COUNT: 0 10*9/L (ref 0.0–0.1)
EOSINOPHILS ABSOLUTE COUNT: 0.1 10*9/L (ref 0.0–0.4)
EOSINOPHILS RELATIVE PERCENT: 2.2 %
HEMATOCRIT: 41 % (ref 36.0–46.0)
HEMOGLOBIN: 13 g/dL (ref 12.0–16.0)
LARGE UNSTAINED CELLS: 1 % (ref 0–4)
LYMPHOCYTES ABSOLUTE COUNT: 1.4 10*9/L — ABNORMAL LOW (ref 1.5–5.0)
LYMPHOCYTES RELATIVE PERCENT: 30 %
MEAN CORPUSCULAR HEMOGLOBIN CONC: 31.6 g/dL (ref 31.0–37.0)
MEAN CORPUSCULAR HEMOGLOBIN: 30.5 pg (ref 26.0–34.0)
MEAN PLATELET VOLUME: 8.4 fL (ref 7.0–10.0)
MONOCYTES RELATIVE PERCENT: 4.5 %
NEUTROPHILS ABSOLUTE COUNT: 2.9 10*9/L (ref 2.0–7.5)
NEUTROPHILS RELATIVE PERCENT: 61.5 %
PLATELET COUNT: 220 10*9/L (ref 150–440)
RED BLOOD CELL COUNT: 4.26 10*12/L (ref 4.00–5.20)
RED CELL DISTRIBUTION WIDTH: 13.5 % (ref 12.0–15.0)
WBC ADJUSTED: 4.7 10*9/L (ref 4.5–11.0)

## 2018-08-25 LAB — COMPREHENSIVE METABOLIC PANEL
ALBUMIN: 4.7 g/dL (ref 3.5–5.0)
ALT (SGPT): 28 U/L (ref <35)
ANION GAP: 10 mmol/L (ref 7–15)
AST (SGOT): 37 U/L (ref 14–38)
BILIRUBIN TOTAL: 0.5 mg/dL (ref 0.0–1.2)
BLOOD UREA NITROGEN: 13 mg/dL (ref 7–21)
BUN / CREAT RATIO: 19
CALCIUM: 10.2 mg/dL (ref 8.5–10.2)
CO2: 28 mmol/L (ref 22.0–30.0)
CREATININE: 0.68 mg/dL (ref 0.60–1.00)
EGFR CKD-EPI AA FEMALE: >90 mL/min/{1.73_m2} (ref >=60)
GLUCOSE RANDOM: 108 mg/dL (ref 70–179)
POTASSIUM: 4.5 mmol/L (ref 3.5–5.0)
PROTEIN TOTAL: 7.4 g/dL (ref 6.5–8.3)
SODIUM: 142 mmol/L (ref 135–145)

## 2018-08-25 LAB — INR: Lab: 0.94

## 2018-08-25 LAB — AFP-TUMOR MARKER: Alpha-1-Fetoprotein.tumor marker:MCnc:Pt:Ser/Plas:Qn:: 4

## 2018-08-25 LAB — CHLORIDE: Chloride:SCnc:Pt:Ser/Plas:Qn:: 104

## 2018-08-25 LAB — RED CELL DISTRIBUTION WIDTH: Lab: 13.5

## 2018-08-25 NOTE — Unmapped (Signed)
Autoimmune hepatitis doing well on CellCept. Continue indefinitely. We will continue periodic screening every 6 months for liver cancer with MRI or ultrasound. Return to office in May with MRI on same day.

## 2018-08-31 NOTE — Unmapped (Signed)
[facility] LIVER [facility]ENTER    [name], M.D.  Professor of Medicine  Director, [facility]  [place] [facility] [place] at [place]    [phone]    [name]    Referring MD: Midge Minium, MD    Chief complaint: Office follow-up for elevation of liver enzymes. Hospitalization (08/2016) for acute hepatitis of uncertain etiology, presumed autoimmune hepatitis treated with prednisone and mycophenylate. DRUG FEVER ASSOCIATED WITH AZATHIOPRINE. STEROIDS STOPPED 07/2017. CONTINUES ON CELLCEPT 1 GRAM BID FOR IMMUNOSUPPRESSION    Interval history: Patient continues to feel great. She exercises daily.  She denies fever, cough, sob, N/V, abdominal pain, chest pain, or skin rash.  All prednisone stopped as of 07/2017. She has been maintained on mycophenylate 1gram bid. No new health related concerns.    Present illness:  Patient is a 58 y.o. Caucasian female who was hospitalized at [facility] between approximately February 12 -  August 29, 2016 with acute hepatitis. The patient had about 10 weeks of abnormal liver tests documented by her primary physician and her primary gastroenterologist. This was preceded by a short flu-like illness characterized by fatigue, muscle aches, and low-grade fever. All of the symptoms have completely remained resolved since her  Visit 09/10/2016. However she was left with elevated liver tests and had an extensive workup as an outpatient and subsequently as an inpatient at [facility] to further define the etiology of her liver disease.  Additional details of her history and prior outpatient evaluation as well as her inpatient evaluation are available from our initial consultation note and follow-up notes while she was hospitalized.       After review of the liver biopsy and all serologic data thus far which has been unrevealing, it was felt that a therapeutic trial of prednisone for presumed seronegative autoimmune hepatitis was warranted. Prednisone  40 mg of daily started on 08/29/16. She had a good biochemical response with marked improvement in ALT, INR, including a slow but steady decrease in Total/direct bilirubin.     Patient had an extensive serological evaluation which was not revealing:  1. Antinuclear antibody and anti-smooth muscle antibody were negative. Immunoglobulin G was not elevated (1061)  2. Viral serologies including acute hepatitis A, hepatitis B, hepatitis C, HCV RNA, EBV PCR, CMV PCR, HHV-6 were all negative.  3. Liver ultrasound demonstrated patent vasculature with a nodular-appearing heterogeneous liver.  4. Percutaneous liver biopsy 08/2016 at [facility]:    A: Liver, core biopsy   - Severe active hepatitis with confluent areas of periportal hepatocellular necrosis (approximately 10-20% of sampled parenchyma) (see comment)   - Trichrome stain demonstrates at least periportal fibrosis and is suspicious for bridging fibrosis     Potential etiologies to consider include, but are not limited to, infection, drug/toxin-induced liver injury, seronegative autoimmune hepatitis, and metabolic disorders. The trichrome stain findings suggest that this process is at least subacute in duration.      Liver biopsy slides were personally reviewed by Dr. Sharon Mt  with the pathologist.  No viral inclusions were noted and only few plasma cells were visualized in the liver biopsy which was an adequate specimen. No cytopathic effect. EBV immunostains were negative. Fe in hepatocytes only grade 1/4    5.  Evaluation for Zoster: Dr. Foy Guadalajara received phone call from patient's primary GI MD that patient's Zoster IgM from 09/05/2016 was positive with titer 1.17 approximately one week after starting prednisone. Review of her record though revealed this test to have been negative on 08/29/2016.  Dr. Sharon Mt discussed these findings with Dr. Lytle Butte and the decision was made to proceed with patient starting Valtrex 500 mg bid.  She was seen by Dr. 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   Recommendations from ID: Continue valacyclovir 500 mg bid.  Advised readdressing stopping Valtrex if her repeat IgM is negative or once she is on <15 mg prednisone daily.  Valtrex was discontinued last week.  Bactrim prophylaxis while prednisone >15 mg q 24. 1 DS tablet M/W/F. The following vaccines were recommended for her to obtain under the care of her PCP: Twinrix, PCV13, and PPSV23. She should have these performed by her PCP when prednisone dose is  <20 mg daily.     Immunosuppressive history:  Patient was started on azathioprine for steroid sparing benefits in early April.  After 2 weeks patient developed fevers. ID workup negative during hospitalization.  Azathioprine was held during that hospitalization and fevers resolved. When she restarted azathioprine as outpatient, immediately had high fevers and shaking chills.  This rechallenge was highly suggestive of drug fever from azathioprine.    On 5/12 patient was started on CellCept 500mg  bid and continued on prednisone 15 mg daily.  On 12/03/16, liver enzymes remained elevated. Cellcept was increased to 1000mg  BID on 12/04/16. Prednisone tapered to off as of 07/2017.     10 sys ROS otherwise negative.     Past medical history:  Patient has generally been healthy until the current hepatitis began in December 2017.  1. No history of diabetes, coronary artery disease, hypertension, or lung disease.  2. No history of other autoimmune systemic diseases  3. FIBROSCAN 11/05/16: 17.7 kps c/w stage F4 (Patient ate breakfast 3 hours prior).  4. MRI 10/2017: HEPATOBILIARY: Nodular liver contour compatible with history of cirrhosis and similar to prior. Again seen is a geographic region of T1 hypointensity and corresponding slight T2 hyperintensity in the posterior right hepatic lobe which is overall similar in extent when compared to prior imaging. This region demonstrates progressive postcontrast enhancement as before. Overall similar degree of capsular retraction most pronounced along the right anterior margin of the liver (13:29). No discrete early arterially enhancing or washout liver lesion identified. No biliary ductal dilatation. Similar appearance and position of the borderline hydropic gallbladder.  PANCREAS: Pancreas divisum. No ductal dilatation.    5. MRI 04/2018: Similar as above, without significant change.     Allergies   Allergen Reactions   ??? Azathioprine Other (See Comments)     Fever       Current Outpatient Medications   Medication Sig Dispense Refill   ??? mycophenolate (CELLCEPT) 500 mg tablet TAKE 2 TABLETS BY MOUTH TWICE DAILY WITH FOOD 120 each 5   ??? ketoconazole (NIZORAL) 2 % cream Apply 1 application topically Two (2) times a day. To affected areas as directed (Patient not taking: Reported on 11/02/2017) 60 g 1   ??? mycophenolate (CELLCEPT) 500 mg tablet Take 2 tablets (1,000 mg total) by mouth Two (2) times a day. (Patient not taking: Reported on 04/28/2018) 120 tablet 5     No current facility-administered medications for this visit.      Social history: The patient is married. She works as a Materials engineer. They have traveled extensively.   She does not drink any alcohol. She does not smoke cigarettes.    Family history: Negative for liver disease or liver cancer. Negative for autoimmune systemic diseases.  Physical Examination:    BP 144/82  - Pulse 74  - Temp 36.5 ??C (97.7 ??F)  - Resp 16  - Wt 70.3 kg (155 lb)  - SpO2 99%  - BMI 25.02 kg/m??   General: Extremely Pleasant, WD, WN Caucasian female. NAD noted,   HEENT: Sclera anicteric, not injected,  no temporal muscle loss, oropharynx is negative  NECK: No thyromegaly or lymphadenopathy, No carotid bruits  Chest: Clear to auscultation and percussion  Heart: S1, S2, RR, No murmurs  Abdomen: Soft, non-tender, non-distended, no hepatosplenomegaly, no masses appreciated, no ascites  Skin: No spider angiomata, No rashes Extremities: Without pedal edema, + palmar erythema  Neuro: Grossly intact, No focal deficits, no asterixis.    Laboratory Studies:     Results for orders placed or performed in visit on 08/25/18   AFP tumor marker   Result Value Ref Range    AFP-Tumor Marker 4 <8 ng/mL   PT-INR   Result Value Ref Range    PT 10.8 10.2 - 13.1 sec    INR 0.94    Comprehensive Metabolic Panel   Result Value Ref Range    Sodium 142 135 - 145 mmol/L    Potassium 4.5 3.5 - 5.0 mmol/L    Chloride 104 98 - 107 mmol/L    Anion Gap 10 7 - 15 mmol/L    CO2 28.0 22.0 - 30.0 mmol/L    BUN 13 7 - 21 mg/dL    Creatinine 1.61 0.96 - 1.00 mg/dL    BUN/Creatinine Ratio 19     EGFR CKD-EPI Non-African American, Female >90 >=60 mL/min/1.74m2    EGFR CKD-EPI African American, Female >90 >=60 mL/min/1.56m2    Glucose 108 70 - 179 mg/dL    Calcium 04.5 8.5 - 40.9 mg/dL    Albumin 4.7 3.5 - 5.0 g/dL    Total Protein 7.4 6.5 - 8.3 g/dL    Total Bilirubin 0.5 0.0 - 1.2 mg/dL    AST 37 14 - 38 U/L    ALT 28 <35 U/L    Alkaline Phosphatase 53 38 - 126 U/L   CBC w/ Differential   Result Value Ref Range    WBC 4.7 4.5 - 11.0 10*9/L    RBC 4.26 4.00 - 5.20 10*12/L    HGB 13.0 12.0 - 16.0 g/dL    HCT 81.1 91.4 - 78.2 %    MCV 96.4 80.0 - 100.0 fL    MCH 30.5 26.0 - 34.0 pg    MCHC 31.6 31.0 - 37.0 g/dL    RDW 95.6 21.3 - 08.6 %    MPV 8.4 7.0 - 10.0 fL    Platelet 220 150 - 440 10*9/L    Neutrophils % 61.5 %    Lymphocytes % 30.0 %    Monocytes % 4.5 %    Eosinophils % 2.2 %    Basophils % 0.5 %    Absolute Neutrophils 2.9 2.0 - 7.5 10*9/L    Absolute Lymphocytes 1.4 (L) 1.5 - 5.0 10*9/L    Absolute Monocytes 0.2 0.2 - 0.8 10*9/L    Absolute Eosinophils 0.1 0.0 - 0.4 10*9/L    Absolute Basophils 0.0 0.0 - 0.1 10*9/L    Large Unstained Cells 1 0 - 4 %    Macrocytosis Slight (A) Not Present       Impression:  1. Seronegative autoimmune hepatitis based on response to immunosuppressive medication. The patient presented with severe hepatitis as evidenced by hyperbilirubinemia, elevation of her INR, decreased  albumin, and results from her liver biopsy. Patient was started on a therapeutic trial of prednisone for the reasons discussed above. After only 4 days of immunosuppression, her liver enzymes (ALT)  that had been regularly elevated above 1000 decreased substantially.  This was highly suggestive of a positive effect of the prednisone and was c/w  presumed ANA-negative/SMA-negative autoimmune hepatitis.Labs showed continued normalization of bilirubin and INR, although she remained with mildly elevated ALT in the 40-45 range. CellCept was added on 5/12 and increased to 1000mg  BID on 12/04/16.Steroids tapered to off as of 07/2017.  We will maintain current level of CellCept.  ALT has remained essentially normal since D/C of steroids without flaring. Patient feels completely well.     2. Cirrhosis, well compensated:  FIBROSCAN suggestive of cirrhosis and MRI suggestive of nodular contour with focal fibrosis.  Liver biopsy during acute episode did demonstrate possible bridging fibrosis. Inflammation now resolved.Defer repeat liver biopsy but may reconsider after prolonged remission to reevaluate, not sure it will change management at this point. Repeat MRI at next visit.    3. HCC surveillance: For presumed cirrhosis as described above. 04/2018-MRI with evidence of cirrhosis. No evidence of mass. Repeat at next visit. Marland Kitchen      4. Drug fevers from Azathioprine: resolved, no recurrent fever.    5. Mild thrombocytopenia: Resolved. Normal size spleen.     4. Twinrix x 3 completed.     5. Eye symptoms: Felt to be steroid-related,  Now resolved as steroid dose decreased    6. Vaccinations: Patient will discuss Pneumovax and Shingles vaccine with her PCP.      RTC 12 weeks with interval labs to assess immunosuppression response and MRI same day.         [name], M.D.  Professor of Medicine  Director, [facility]  [place] [facility] [place] at [place] [phone]

## 2018-09-13 NOTE — Unmapped (Signed)
[facility] Refill Coordination Note  Specialty Medication(s): Mycophenolate 500mg   Additional Medications shipped:      Margaret Berger, DOB: October 27, 1960  Phone: There are no phone numbers on file., Alternate phone contact: N/A  Phone or address changes today?: No  All above HIPAA information was verified with patient.  Shipping Address: [address]  [place] [address]   Insurance changes? No    Completed refill call assessment today to schedule patient's medication shipment from the [facility] [phone]).      Confirmed the medication and dosage are correct and have not changed: Yes, regimen is correct and unchanged.    Confirmed patient started or stopped the following medications in the past month:  No, there are no changes reported at this time.    Are you tolerating your medication?:  Margaret Berger reports tolerating the medication.    ADHERENCE    (Below is required for Medicare Part B or Transplant patients only - per drug):   How many tablets were dispensed last month: 120 tabs  Patient currently has 14 days remaining.    Did you miss any doses in the past 4 weeks? No missed doses reported.    FINANCIAL/SHIPPING    Delivery Scheduled: Yes, Expected medication delivery date: [id number]     Medication will be delivered via Next Day Courier to the home address in [facility]    The patient will receive a drug information handout for each medication shipped and additional FDA Medication Guides as required.      Margaret Berger did not have any additional questions at this time.    We will follow up with patient monthly for standard refill processing and delivery.      Thank you,  Antonietta Barcelona   [facility] Specialty Technician

## 2018-09-20 MED FILL — MYCOPHENOLATE MOFETIL 500 MG TABLET: 30 days supply | Qty: 120 | Fill #1 | Status: AC

## 2018-09-20 MED FILL — MYCOPHENOLATE MOFETIL 500 MG TABLET: ORAL | 30 days supply | Qty: 120 | Fill #1

## 2018-09-27 ENCOUNTER — Telehealth: Payer: Self-pay

## 2018-09-27 NOTE — Telephone Encounter (Signed)
Copied from [place] [phone]. Topic: General - Other >> Sep 27, 2018  2:23 PM Marin Olp L wrote: Reason for CRM: Patient needs to know if she had both shingles vaccines already?

## 2018-09-27 NOTE — Telephone Encounter (Signed)
Return pt's call to schedule 2nd Shingrix injection.  LMTCB.  PEC may schedule appt on Nurse Schedule.

## 2018-10-18 ENCOUNTER — Encounter: Payer: Self-pay | Admitting: *Deleted

## 2018-10-18 NOTE — Unmapped (Signed)
[facility] Refill Coordination Note    Specialty Medication(s) to be Shipped:   General Specialty: mycophenolate 500mg     Other medication(s) to be shipped:       Margaret Berger, DOB: July 11, 1961  Phone: There are no phone numbers on file.      All above HIPAA information was verified with patient.     Completed refill call assessment today to schedule patient's medication shipment from the [facility] [phone]).       Specialty medication(s) and dose(s) confirmed: Regimen is correct and unchanged.   Changes to medications: [name] reports no changes reported at this time.  Changes to insurance: No  Questions for the pharmacist: No    Confirmed patient received Welcome Packet with first shipment. The patient will receive a drug information handout for each medication shipped and additional FDA Medication Guides as required.       DISEASE/MEDICATION-SPECIFIC INFORMATION        N/A    SPECIALTY MEDICATION ADHERENCE     Medication Adherence    Patient reported X missed doses in the last month:  0  Specialty Medication:  Mycophenolate 500mg                  Mycophenolate 500 mg: 10 days of medicine on hand       SHIPPING     Shipping address confirmed in Epic.     Delivery Scheduled: Yes, Expected medication delivery date: [phone].     Medication will be delivered via Next Day Courier to the home address in [employer] [facility].    Antonietta Barcelona   [facility] Specialty Technician

## 2018-10-25 MED FILL — MYCOPHENOLATE MOFETIL 500 MG TABLET: 30 days supply | Qty: 120 | Fill #2 | Status: AC

## 2018-10-25 MED FILL — MYCOPHENOLATE MOFETIL 500 MG TABLET: ORAL | 30 days supply | Qty: 120 | Fill #2

## 2018-10-25 NOTE — Unmapped (Signed)
Margaret Berger 's MYCOPHENOLATE shipment will be delayed due to Cost Increase We have contacted the patient and left a message We will wait for a call back from the patient/caregiver to reschedule the delivery.  We have confirmed the delivery date as N/A .

## 2018-10-25 NOTE — Unmapped (Signed)
Margaret Berger 's Mycophenolate shipment will be delayed due to Cost Increase We have contacted the patient and communicated the delivery change to patient/caregiver We will reschedule the medication for the delivery date that the patient agreed upon. We have confirmed the delivery date as [phone]   .

## 2018-11-16 NOTE — Unmapped (Signed)
[facility] Clinical Assessment & Refill Coordination Note    Margaret Berger, DOB: [birth year]  Phone: There are no phone numbers on file.    All above HIPAA information was verified with patient.     Specialty Medication(s):   General Specialty: Monovisc and mycophenolate 500mg      Current Outpatient Medications   Medication Sig Dispense Refill   ??? ketoconazole (NIZORAL) 2 % cream Apply 1 application topically Two (2) times a day. To affected areas as directed (Patient not taking: Reported on 11/02/2017) 60 g 1   ??? mycophenolate (CELLCEPT) 500 mg tablet Take 2 tablets (1,000 mg total) by mouth Two (2) times a day. (Patient not taking: Reported on 04/28/2018) 120 tablet 5   ??? mycophenolate (CELLCEPT) 500 mg tablet TAKE 2 TABLETS BY MOUTH TWICE DAILY WITH FOOD 120 each 5     No current facility-administered medications for this visit.         Changes to medications: [name] reports no changes at this time.    Allergies   Allergen Reactions   ??? Azathioprine Other (See Comments)     Fever       Changes to allergies: No    SPECIALTY MEDICATION ADHERENCE     Mycophenolate 500 mg: 14 days of medicine on hand     Specialty medication(s) dose(s) confirmed: Regimen is correct and unchanged.     Are there any concerns with adherence? No    Adherence counseling provided? Not needed    CLINICAL MANAGEMENT AND INTERVENTION      Clinical Benefit Assessment:    Do you feel the medicine is effective or helping your condition? Yes    Clinical Benefit counseling provided? Progress note from 08/25/18 shows evidence of clinical benefit    Adverse Effects Assessment:    Are you experiencing any side effects? No    Are you experiencing difficulty administering your medicine? No    Quality of Life Assessment:    How many days over the past month did your condition  keep you from your normal activities? For example, brushing your teeth or getting up in the morning. 0    Have you discussed this with your provider? Not needed    Therapy Appropriateness:    Is therapy appropriate? Yes, therapy is appropriate and should be continued    DISEASE/MEDICATION-SPECIFIC INFORMATION      N/A    PATIENT SPECIFIC NEEDS     ? Does the patient have any physical, cognitive, or cultural barriers? No    ? Is the patient high risk? No     ? Does the patient require a Care Management Plan? No     ? Does the patient require physician intervention or other additional services (i.e. nutrition, smoking cessation, social work)? No      SHIPPING     Specialty Medication(s) to be Shipped:   General Specialty: mycophenolate 500mg     Other medication(s) to be shipped: none     Changes to insurance: No    Delivery Scheduled: Yes, Expected medication delivery date: 11/30/18.     Medication will be delivered via UPS to the confirmed home address in [facility].    The patient will receive a drug information handout for each medication shipped and additional FDA Medication Guides as required.  Verified that patient has previously received a Conservation officer, historic buildings.    Breck Coons [facility] Specialty Pharmacist

## 2018-11-29 MED FILL — MYCOPHENOLATE MOFETIL 500 MG TABLET: 30 days supply | Qty: 120 | Fill #3 | Status: AC

## 2018-11-29 MED FILL — MYCOPHENOLATE MOFETIL 500 MG TABLET: ORAL | 30 days supply | Qty: 120 | Fill #3

## 2018-11-30 ENCOUNTER — Other Ambulatory Visit: Payer: Self-pay

## 2018-11-30 ENCOUNTER — Ambulatory Visit (INDEPENDENT_AMBULATORY_CARE_PROVIDER_SITE_OTHER): Payer: BLUE CROSS/BLUE SHIELD

## 2018-11-30 DIAGNOSIS — Z23 Encounter for immunization: Secondary | ICD-10-CM | POA: Diagnosis not present

## 2018-12-01 ENCOUNTER — Encounter: Admit: 2018-12-01 | Discharge: 2018-12-01 | Payer: PRIVATE HEALTH INSURANCE

## 2018-12-01 ENCOUNTER — Encounter
Admit: 2018-12-01 | Discharge: 2018-12-01 | Payer: PRIVATE HEALTH INSURANCE | Attending: Gastroenterology | Primary: Gastroenterology

## 2018-12-01 DIAGNOSIS — K7689 Other specified diseases of liver: Secondary | ICD-10-CM | POA: Diagnosis not present

## 2018-12-01 DIAGNOSIS — K769 Liver disease, unspecified: Secondary | ICD-10-CM | POA: Diagnosis not present

## 2018-12-01 DIAGNOSIS — K7460 Unspecified cirrhosis of liver: Secondary | ICD-10-CM | POA: Diagnosis not present

## 2018-12-01 DIAGNOSIS — R918 Other nonspecific abnormal finding of lung field: Secondary | ICD-10-CM | POA: Diagnosis not present

## 2018-12-01 DIAGNOSIS — K754 Autoimmune hepatitis: Secondary | ICD-10-CM | POA: Diagnosis not present

## 2018-12-01 LAB — CBC W/ AUTO DIFF
BASOPHILS RELATIVE PERCENT: 0.2 %
EOSINOPHILS ABSOLUTE COUNT: 0.1 10*9/L (ref 0.0–0.4)
EOSINOPHILS RELATIVE PERCENT: 0.9 %
HEMATOCRIT: 43.3 % (ref 36.0–46.0)
HEMOGLOBIN: 14 g/dL (ref 12.0–16.0)
LARGE UNSTAINED CELLS: 1 % (ref 0–4)
LYMPHOCYTES ABSOLUTE COUNT: 0.7 10*9/L — ABNORMAL LOW (ref 1.5–5.0)
LYMPHOCYTES RELATIVE PERCENT: 8.7 %
MEAN CORPUSCULAR HEMOGLOBIN CONC: 32.3 g/dL (ref 31.0–37.0)
MEAN CORPUSCULAR HEMOGLOBIN: 31.1 pg (ref 26.0–34.0)
MEAN CORPUSCULAR VOLUME: 96.3 fL (ref 80.0–100.0)
MEAN PLATELET VOLUME: 7.1 fL (ref 7.0–10.0)
MONOCYTES ABSOLUTE COUNT: 0.3 10*9/L (ref 0.2–0.8)
MONOCYTES RELATIVE PERCENT: 3.2 %
NEUTROPHILS ABSOLUTE COUNT: 6.8 10*9/L (ref 2.0–7.5)
NEUTROPHILS RELATIVE PERCENT: 86.6 %
RED BLOOD CELL COUNT: 4.5 10*12/L (ref 4.00–5.20)
WBC ADJUSTED: 7.9 10*9/L (ref 4.5–11.0)

## 2018-12-01 LAB — COMPREHENSIVE METABOLIC PANEL
ALBUMIN: 5.3 g/dL — ABNORMAL HIGH (ref 3.5–5.0)
ALKALINE PHOSPHATASE: 70 U/L (ref 38–126)
ALT (SGPT): 28 U/L (ref <35)
ANION GAP: 11 mmol/L (ref 7–15)
BILIRUBIN TOTAL: 0.8 mg/dL (ref 0.0–1.2)
BLOOD UREA NITROGEN: 11 mg/dL (ref 7–21)
BUN / CREAT RATIO: 17
CALCIUM: 10.2 mg/dL (ref 8.5–10.2)
CHLORIDE: 101 mmol/L (ref 98–107)
CO2: 26 mmol/L (ref 22.0–30.0)
CREATININE: 0.65 mg/dL (ref 0.60–1.00)
EGFR CKD-EPI AA FEMALE: >90 mL/min/{1.73_m2} (ref >=60)
EGFR CKD-EPI NON-AA FEMALE: >90 mL/min/{1.73_m2} (ref >=60)
GLUCOSE RANDOM: 112 mg/dL (ref 70–179)
PROTEIN TOTAL: 8.2 g/dL (ref 6.5–8.3)
SODIUM: 138 mmol/L (ref 135–145)

## 2018-12-01 LAB — AFP-TUMOR MARKER: Alpha-1-Fetoprotein.tumor marker:MCnc:Pt:Ser/Plas:Qn:: 4

## 2018-12-01 LAB — INR: Lab: 0.94

## 2018-12-01 LAB — BLOOD UREA NITROGEN: Urea nitrogen:MCnc:Pt:Ser/Plas:Qn:: 11

## 2018-12-01 LAB — NEUTROPHILS RELATIVE PERCENT: Lab: 86.6

## 2018-12-06 NOTE — Unmapped (Addendum)
[facility]    [name], M.D.  Professor of Medicine  Director, [facility]  [place] at [place]    [phone]    [name]    Referring MD: Midge Minium, MD    Phone visit 12/01/18    Chief complaint: Office follow-up for elevation of liver enzymes. Hospitalization (08/2016) for acute hepatitis of uncertain etiology, presumed autoimmune hepatitis treated with prednisone and mycophenylate. DRUG FEVER ASSOCIATED WITH AZATHIOPRINE. STEROIDS STOPPED 07/2017. CONTINUES ON CELLCEPT 1 GRAM BID FOR IMMUNOSUPPRESSION    Interval history: Patient continues to feel great. She received her second Shingrix vaccination yesterday and is having a mild reaction.  She denies fever, cough, sob, N/V, abdominal pain, chest pain, or skin rash.  She has been maintained on mycophenylate 1gram bid. No new health related concerns. She has been very careful with social distancing.     Present illness:  Patient is a 58 y.o. Caucasian female who was hospitalized at [facility] between approximately February 12 -  August 29, 2016 with acute hepatitis. The patient had about 10 weeks of abnormal liver tests documented by her primary physician and her primary gastroenterologist. This was preceded by a short flu-like illness characterized by fatigue, muscle aches, and low-grade fever. All of the symptoms have completely remained resolved since her  Visit 09/10/2016. However she was left with elevated liver tests and had an extensive workup as an outpatient and subsequently as an inpatient at [facility] to further define the etiology of her liver disease.  Additional details of her history and prior outpatient evaluation as well as her inpatient evaluation are available from our initial consultation note and follow-up notes while she was hospitalized.       After review of the liver biopsy and all serologic data thus far which has been unrevealing, it was felt that a therapeutic trial of prednisone for presumed seronegative autoimmune hepatitis was warranted. Prednisone  40 mg of daily started on 08/29/16. She had a good biochemical response with marked improvement in ALT, INR, including a slow but steady decrease in Total/direct bilirubin.     Patient had an extensive serological evaluation which was not revealing:  1. Antinuclear antibody and anti-smooth muscle antibody were negative. Immunoglobulin G was not elevated (1061)  2. Viral serologies including acute hepatitis A, hepatitis B, hepatitis C, HCV RNA, EBV PCR, CMV PCR, HHV-6 were all negative.  3. Liver ultrasound demonstrated patent vasculature with a nodular-appearing heterogeneous liver.  4. Percutaneous liver biopsy 08/2016 at [facility]:    A: Liver, core biopsy   - Severe active hepatitis with confluent areas of periportal hepatocellular necrosis (approximately 10-20% of sampled parenchyma) (see comment)   - Trichrome stain demonstrates at least periportal fibrosis and is suspicious for bridging fibrosis     Potential etiologies to consider include, but are not limited to, infection, drug/toxin-induced liver injury, seronegative autoimmune hepatitis, and metabolic disorders. The trichrome stain findings suggest that this process is at least subacute in duration.      Liver biopsy slides were personally reviewed by Dr. Sharon Mt  with the pathologist.  No viral inclusions were noted and only few plasma cells were visualized in the liver biopsy which was an adequate specimen. No cytopathic effect. EBV immunostains were negative. Fe in hepatocytes only grade 1/4    5.  Evaluation for Zoster: Dr. Foy Guadalajara received phone call from patient's primary GI MD that patient's Zoster IgM from 09/05/2016 was positive with titer 1.17 approximately  one week after starting prednisone. Review of her record though revealed this test to have been negative on 08/29/2016.  Dr. Sharon Mt discussed these findings with Dr. Lytle Butte and the decision was made to proceed with patient starting Valtrex 500 mg bid.  She was seen by Dr. 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   Recommendations from ID: Continue valacyclovir 500 mg bid.  Advised readdressing stopping Valtrex if her repeat IgM is negative or once she is on <15 mg prednisone daily.  Valtrex was discontinued last week.  Bactrim prophylaxis while prednisone >15 mg q 24. 1 DS tablet M/W/F. The following vaccines were recommended for her to obtain under the care of her PCP: Twinrix, PCV13, and PPSV23. She should have these performed by her PCP when prednisone dose is  <20 mg daily.     Immunosuppressive history:  Patient was started on azathioprine for steroid sparing benefits in early April.  After 2 weeks patient developed fevers. ID workup negative during hospitalization.  Azathioprine was held during that hospitalization and fevers resolved. When she restarted azathioprine as outpatient, immediately had high fevers and shaking chills.  This rechallenge was highly suggestive of drug fever from azathioprine.    On 5/12 patient was started on CellCept 500mg  bid and continued on prednisone 15 mg daily.  On 12/03/16, liver enzymes remained elevated. Cellcept was increased to 1000mg  BID on 12/04/16. Prednisone tapered to off as of 07/2017.     10 sys ROS otherwise negative.     Past medical history:  Patient has generally been healthy until the current hepatitis began in December 2017.  1. No history of diabetes, coronary artery disease, hypertension, or lung disease.  2. No history of other autoimmune systemic diseases  3. FIBROSCAN 11/05/16: 17.7 kps c/w stage F4 (Patient ate breakfast 3 hours prior).  4. MRI 10/2017: HEPATOBILIARY: Nodular liver contour compatible with history of cirrhosis and similar to prior. Again seen is a geographic region of T1 hypointensity and corresponding slight T2 hyperintensity in the posterior right hepatic lobe which is overall similar in extent when compared to prior imaging. This region demonstrates progressive postcontrast enhancement as before. Overall similar degree of capsular retraction most pronounced along the right anterior margin of the liver (13:29). No discrete early arterially enhancing or washout liver lesion identified. No biliary ductal dilatation. Similar appearance and position of the borderline hydropic gallbladder.  PANCREAS: Pancreas divisum. No ductal dilatation.    5. MRI 11/2018: Morphologic features consistent with history of cirrhosis. No sequela of portal hypertension.No MR evidence of HCC.  Similar appearing probable confluent fibrosis in the right hepatic lobe.    Allergies   Allergen Reactions   ??? Azathioprine Other (See Comments)     Fever       Current Outpatient Medications   Medication Sig Dispense Refill   ??? mycophenolate (CELLCEPT) 500 mg tablet TAKE 2 TABLETS (1000 MG) BY MOUTH TWICE DAILY WITH FOOD. 120 each 5   ??? ibuprofen (MOTRIN) 600 MG tablet Take 600 mg by mouth every six (6) hours as needed.     ??? ketoconazole (NIZORAL) 2 % cream Apply 1 application topically Two (2) times a day. To affected areas as directed (Patient not taking: Reported on 11/02/2017) 60 g 1   ??? mycophenolate (CELLCEPT) 500 mg tablet Take 2 tablets (1,000 mg total) by mouth Two (2) times a day. (Patient not taking: Reported on 04/28/2018) 120 tablet 5  No current facility-administered medications for this visit.      Social history: The patient is married. She works as a Materials engineer. They have traveled extensively.   She does not drink any alcohol. She does not smoke cigarettes.    Family history: Negative for liver disease or liver cancer. Negative for autoimmune systemic diseases.    Physical Examination: No exam performed as this was Phone visit.      Laboratory Studies:     Results for orders placed or performed in visit on 12/01/18   Comprehensive Metabolic Panel   Result Value Ref Range    Sodium 138 135 - 145 mmol/L    Potassium 4.1 3.5 - 5.0 mmol/L    Chloride 101 98 - 107 mmol/L    Anion Gap 11 7 - 15 mmol/L    CO2 26.0 22.0 - 30.0 mmol/L    BUN 11 7 - 21 mg/dL    Creatinine 1.61 0.96 - 1.00 mg/dL    BUN/Creatinine Ratio 17     EGFR CKD-EPI Non-African American, Female >90 >=60 mL/min/1.39m2    EGFR CKD-EPI African American, Female >90 >=60 mL/min/1.51m2    Glucose 112 70 - 179 mg/dL    Calcium 04.5 8.5 - 40.9 mg/dL    Albumin 5.3 (H) 3.5 - 5.0 g/dL    Total Protein 8.2 6.5 - 8.3 g/dL    Total Bilirubin 0.8 0.0 - 1.2 mg/dL    AST 33 17 - 47 U/L    ALT 28 <35 U/L    Alkaline Phosphatase 70 38 - 126 U/L   PT-INR   Result Value Ref Range    PT 10.8 10.2 - 13.1 sec    INR 0.94    AFP tumor marker   Result Value Ref Range    AFP-Tumor Marker 4 <8 ng/mL   CBC w/ Differential   Result Value Ref Range    WBC 7.9 4.5 - 11.0 10*9/L    RBC 4.50 4.00 - 5.20 10*12/L    HGB 14.0 12.0 - 16.0 g/dL    HCT 81.1 91.4 - 78.2 %    MCV 96.3 80.0 - 100.0 fL    MCH 31.1 26.0 - 34.0 pg    MCHC 32.3 31.0 - 37.0 g/dL    RDW 95.6 21.3 - 08.6 %    MPV 7.1 7.0 - 10.0 fL    Platelet 158 150 - 440 10*9/L    Neutrophils % 86.6 %    Lymphocytes % 8.7 %    Monocytes % 3.2 %    Eosinophils % 0.9 %    Basophils % 0.2 %    Neutrophil Left Shift 1+ (A) Not Present    Absolute Neutrophils 6.8 2.0 - 7.5 10*9/L    Absolute Lymphocytes 0.7 (L) 1.5 - 5.0 10*9/L    Absolute Monocytes 0.3 0.2 - 0.8 10*9/L    Absolute Eosinophils 0.1 0.0 - 0.4 10*9/L    Absolute Basophils 0.0 0.0 - 0.1 10*9/L    Large Unstained Cells 1 0 - 4 %       Impression:  1. Seronegative autoimmune hepatitis based on response to immunosuppressive medication. The patient presented with severe hepatitis as evidenced by hyperbilirubinemia, elevation of her INR, decreased albumin, and results from her liver biopsy. Patient was started on a therapeutic trial of prednisone for the reasons discussed above. After only 4 days of immunosuppression, her liver enzymes (ALT)  that had been regularly elevated above 1000 decreased substantially.  This was highly suggestive  of a positive effect of the prednisone and was c/w  presumed ANA-negative/SMA-negative autoimmune hepatitis.Labs showed continued normalization of bilirubin and INR, although she remained with mildly elevated ALT in the 40-45 range. CellCept was added on 5/12 and increased to 1000mg  BID on 12/04/16.Steroids tapered to off as of 07/2017.  We will maintain current level of CellCept.  ALT has remained essentially normal since D/C of steroids without flaring. Patient feels completely well.     2. Cirrhosis, well compensated:  FIBROSCAN suggestive of cirrhosis and MRI suggestive of nodular contour with focal fibrosis.  Liver biopsy during acute episode did demonstrate possible bridging fibrosis. Inflammation now resolved.Defer repeat liver biopsy but may reconsider after prolonged remission to reevaluate, not sure it will change management at this point. Repeat MRI in 6 months    3. HCC surveillance: For presumed cirrhosis as described above. 11/2018-MRI with evidence of cirrhosis. No evidence of mass. Uncahnged from prior. Repeat in 6 months.      4. Drug fevers from Azathioprine: resolved, no recurrent fever.    5. Mild thrombocytopenia: Resolved. Normal size spleen.     4. Twinrix x 3 completed.     5. . Vaccinations: Patient had second Shingrix 1 day prior to this phone visit.  She is having a mild reaction. OK to try NSAID or ACM for symptomatic relief.     6. Lymphopenia: Previously very stable on prior serial differential counts. Unclear if related to the Shingrix reaction above. Will repeat in 2 weeks to monitor. Defer CellCept dosage change unless persistently low.      RTC 3 months.    [name], M.D.  Professor of Medicine  Director, [facility]  [place] at [place]    [phone]    I spent 10 minutes on the phone with the patient. I spent an additional 15 minutes on pre- and post-visit activities. The patient was physically located in [place] or a state in which I am permitted to provide care. The patient and/or parent/gauardian understood that s/he may incur co-pays and cost sharing, and agreed to the telemedicine visit. The visit was completed via phone and/or video, which was appropriate and reasonable under the circumstances given the patient's presentation at the time.    The patient and/or parent/guardian has been advised of the potential risks and limitations of this mode of treatment (including, but not limited to, the absence of in-person examination) and has agreed to be treated using telemedicine. The patient's/patient's family's questions regarding telemedicine have been answered.     If the phone/video visit was completed in an ambulatory setting, the patient and/or parent/guardian has also been advised to contact their provider???s office for worsening conditions, and seek emergency medical treatment and/or call 911 if the patient deems either necessary.

## 2018-12-06 NOTE — Unmapped (Addendum)
Doing well on current regimen. Liver tests all normal. Lymphocyte count is decreased from your stable baseline, would like to repeat complete blood count in about 2 weeks to monitor. Lab order placed to be done at [facility] at your convenience.  If persistently low, will consider decreasing dose of CellCept. MRI was unchanged from prior studies. Will repeat per routine in 6 months. Return to office in 3 months.

## 2018-12-23 ENCOUNTER — Ambulatory Visit
Admission: RE | Admit: 2018-12-23 | Discharge: 2018-12-23 | Disposition: A | Discharge status: Home / Self Care | Payer: BLUE CROSS/BLUE SHIELD | Source: Ambulatory Visit | Attending: Internal Medicine | Admitting: Internal Medicine

## 2018-12-23 ENCOUNTER — Other Ambulatory Visit: Payer: Self-pay

## 2018-12-23 DIAGNOSIS — Z1231 Encounter for screening mammogram for malignant neoplasm of breast: Secondary | ICD-10-CM

## 2018-12-27 NOTE — Unmapped (Signed)
[facility] Refill Coordination Note    Specialty Medication(s) to be Shipped:   General Specialty: mycophenolate 500mg     Other medication(s) to be shipped:       [name], DOB: July 07, 1961  Phone: [phone] (home)       All above HIPAA information was verified with patient.     Completed refill call assessment today to schedule patient's medication shipment from the [facility] [phone]).       Specialty medication(s) and dose(s) confirmed: Regimen is correct and unchanged.   Changes to medications: Thena reports no changes at this time.  Changes to insurance: No  Questions for the pharmacist: No    Confirmed patient received Welcome Packet with first shipment. The patient will receive a drug information handout for each medication shipped and additional FDA Medication Guides as required.       DISEASE/MEDICATION-SPECIFIC INFORMATION        N/A    SPECIALTY MEDICATION ADHERENCE     Medication Adherence    Patient reported X missed doses in the last month:  0  Specialty Medication:  Mycophenolate 500mg   Patient is on additional specialty medications:  No                Mycophenolate 500 mg: 14 days of medicine on hand       SHIPPING     Shipping address confirmed in Epic.     Delivery Scheduled: Yes, Expected medication delivery date: 06/24.     Medication will be delivered via UPS to the temporary address in Epic WAM.    Antonietta Barcelona   [facility] Specialty Technician

## 2019-01-04 MED FILL — MYCOPHENOLATE MOFETIL 500 MG TABLET: 30 days supply | Qty: 120 | Fill #4 | Status: AC

## 2019-01-04 MED FILL — MYCOPHENOLATE MOFETIL 500 MG TABLET: ORAL | 30 days supply | Qty: 120 | Fill #4

## 2019-01-18 NOTE — Unmapped (Signed)
Orders Only for follow-up CBC    [name], M.D.  Professor of Medicine  Director, [facility]  [place] at [place]    [phone]

## 2019-01-24 DIAGNOSIS — K754 Autoimmune hepatitis: Secondary | ICD-10-CM | POA: Diagnosis not present

## 2019-01-25 LAB — CBC W/ DIFFERENTIAL
BANDED NEUTROPHILS ABSOLUTE COUNT: 0 10*3/uL (ref 0.0–0.1)
BASOPHILS ABSOLUTE COUNT: 0 10*3/uL (ref 0.0–0.2)
BASOPHILS RELATIVE PERCENT: 0 %
EOSINOPHILS ABSOLUTE COUNT: 0.1 10*3/uL (ref 0.0–0.4)
EOSINOPHILS RELATIVE PERCENT: 1 %
HEMATOCRIT: 38.7 % (ref 34.0–46.6)
HEMOGLOBIN: 12.8 g/dL (ref 11.1–15.9)
IMMATURE GRANULOCYTES: 0 %
LYMPHOCYTES ABSOLUTE COUNT: 1.9 10*3/uL (ref 0.7–3.1)
LYMPHOCYTES RELATIVE PERCENT: 29 %
MEAN CORPUSCULAR HEMOGLOBIN CONC: 33.1 g/dL (ref 31.5–35.7)
MEAN CORPUSCULAR HEMOGLOBIN: 30.8 pg (ref 26.6–33.0)
MONOCYTES ABSOLUTE COUNT: 0.5 10*3/uL (ref 0.1–0.9)
MONOCYTES RELATIVE PERCENT: 8 %
NEUTROPHILS ABSOLUTE COUNT: 4.1 10*3/uL (ref 1.4–7.0)
NEUTROPHILS RELATIVE PERCENT: 62 %
RED BLOOD CELL COUNT: 4.16 x10E6/uL (ref 3.77–5.28)
RED CELL DISTRIBUTION WIDTH: 12.7 % (ref 11.7–15.4)
WHITE BLOOD CELL COUNT: 6.7 10*3/uL (ref 3.4–10.8)

## 2019-01-25 LAB — MEAN CORPUSCULAR HEMOGLOBIN: Erythrocyte mean corpuscular hemoglobin:EntMass:Pt:RBC:Qn:Automated count: 30.8

## 2019-01-25 NOTE — Unmapped (Signed)
[facility] Refill Coordination Note    Specialty Medication(s) to be Shipped:   General Specialty: mycophenolate 500mg     Other medication(s) to be shipped:       Margaret Berger, DOB: 03/30/1961  Phone: [phone] (home)       All above HIPAA information was verified with patient.     Completed refill call assessment today to schedule patient's medication shipment from the [facility] [phone]).       Specialty medication(s) and dose(s) confirmed: Regimen is correct and unchanged.   Changes to medications: Margaret Berger reports no changes at this time.  Changes to insurance: No  Questions for the pharmacist: No    Confirmed patient received Welcome Packet with first shipment. The patient will receive a drug information handout for each medication shipped and additional FDA Medication Guides as required.       DISEASE/MEDICATION-SPECIFIC INFORMATION        N/A    SPECIALTY MEDICATION ADHERENCE     Medication Adherence    Patient reported X missed doses in the last month: 0  Specialty Medication: Mycophenolate 500mg   Patient is on additional specialty medications: No                Mycophenolate 500 mg: 14 days of medicine on hand       SHIPPING     Shipping address confirmed in Epic.     Delivery Scheduled: Yes, Expected medication delivery date: 07/27.     Medication will be delivered via Same Day Courier to the home address in Epic WAM.    Margaret Berger   [facility] Specialty Technician

## 2019-02-07 MED FILL — MYCOPHENOLATE MOFETIL 500 MG TABLET: 30 days supply | Qty: 120 | Fill #5 | Status: AC

## 2019-02-07 MED FILL — MYCOPHENOLATE MOFETIL 500 MG TABLET: ORAL | 30 days supply | Qty: 120 | Fill #5

## 2019-02-23 NOTE — Unmapped (Signed)
[facility] Refill Coordination Note    Specialty Medication(s) to be Shipped:   General Specialty: mycophenolate 500mg      Other medication(s) to be shipped:       Margaret Berger, DOB: 11-20-1960  Phone: There are no phone numbers on file.      All above HIPAA information was verified with patient.     Completed refill call assessment today to schedule patient's medication shipment from the [facility] [phone]).       Specialty medication(s) and dose(s) confirmed: Regimen is correct and unchanged.   Changes to medications: Margaret Berger reports no changes at this time.  Changes to insurance: No  Questions for the pharmacist: No    Confirmed patient received Welcome Packet with first shipment. The patient will receive a drug information handout for each medication shipped and additional FDA Medication Guides as required.       DISEASE/MEDICATION-SPECIFIC INFORMATION        N/A    SPECIALTY MEDICATION ADHERENCE     Medication Adherence    Patient reported X missed doses in the last month: 0  Specialty Medication: Mycophenolate 500mg   Patient is on additional specialty medications: No                Mycophenolate 500 mg: 14 days of medicine on hand     SHIPPING     Shipping address confirmed in Epic.     Delivery Scheduled: Yes, Expected medication delivery date: 08/25.     Medication will be delivered via Next Day Courier to the prescription address in Epic WAM.    Margaret Berger   [facility] Specialty Technician

## 2019-03-02 MED ORDER — MYCOPHENOLATE MOFETIL 500 MG TABLET
ORAL_TABLET | Freq: Two times a day (BID) | ORAL | 5 refills | 30.00000 days | Status: CP
Start: 2019-03-02 — End: 2020-03-01
  Filled 2019-03-07: qty 120, 30d supply, fill #0

## 2019-03-07 MED FILL — MYCOPHENOLATE MOFETIL 500 MG TABLET: 30 days supply | Qty: 120 | Fill #0 | Status: AC

## 2019-03-14 DIAGNOSIS — K759 Inflammatory liver disease, unspecified: Secondary | ICD-10-CM | POA: Diagnosis not present

## 2019-03-14 DIAGNOSIS — K739 Chronic hepatitis, unspecified: Secondary | ICD-10-CM | POA: Diagnosis not present

## 2019-03-14 DIAGNOSIS — R945 Abnormal results of liver function studies: Secondary | ICD-10-CM | POA: Diagnosis not present

## 2019-03-14 NOTE — Unmapped (Signed)
Orders to [facility] via portal. Copy e-mailed to Dr. Foy Guadalajara at his direction.

## 2019-03-15 LAB — CBC W/ DIFFERENTIAL
BANDED NEUTROPHILS ABSOLUTE COUNT: 0 10*3/uL (ref 0.0–0.1)
BASOPHILS RELATIVE PERCENT: 1 %
EOSINOPHILS ABSOLUTE COUNT: 0.1 10*3/uL (ref 0.0–0.4)
EOSINOPHILS RELATIVE PERCENT: 1 %
HEMATOCRIT: 40.2 % (ref 34.0–46.6)
HEMOGLOBIN: 13.4 g/dL (ref 11.1–15.9)
IMMATURE GRANULOCYTES: 0 %
LYMPHOCYTES ABSOLUTE COUNT: 1.7 10*3/uL (ref 0.7–3.1)
LYMPHOCYTES RELATIVE PERCENT: 24 %
MEAN CORPUSCULAR HEMOGLOBIN CONC: 33.3 g/dL (ref 31.5–35.7)
MEAN CORPUSCULAR VOLUME: 94 fL (ref 79–97)
MONOCYTES ABSOLUTE COUNT: 0.4 10*3/uL (ref 0.1–0.9)
MONOCYTES RELATIVE PERCENT: 5 %
NEUTROPHILS RELATIVE PERCENT: 69 %
PLATELET COUNT: 194 10*3/uL (ref 150–450)
RED BLOOD CELL COUNT: 4.27 x10E6/uL (ref 3.77–5.28)
RED CELL DISTRIBUTION WIDTH: 12.5 % (ref 11.7–15.4)

## 2019-03-15 LAB — COMPREHENSIVE METABOLIC PANEL
ALBUMIN: 5.1 g/dL — ABNORMAL HIGH (ref 3.8–4.9)
ALKALINE PHOSPHATASE: 62 IU/L (ref 39–117)
AST (SGOT): 21 IU/L (ref 0–40)
BILIRUBIN TOTAL: 0.3 mg/dL (ref 0.0–1.2)
BLOOD UREA NITROGEN: 13 mg/dL (ref 8–27)
BUN / CREAT RATIO: 17 (ref 12–28)
CALCIUM: 9.5 mg/dL (ref 8.7–10.3)
CHLORIDE: 101 mmol/L (ref 96–106)
CO2: 24 mmol/L (ref 20–29)
CREATININE: 0.75 mg/dL (ref 0.57–1.00)
GLOBULIN, TOTAL: 2 g/dL (ref 1.5–4.5)
GLUCOSE: 126 mg/dL — ABNORMAL HIGH (ref 65–99)
POTASSIUM: 4.1 mmol/L (ref 3.5–5.2)
SODIUM: 139 mmol/L (ref 134–144)

## 2019-03-15 LAB — AFP-TUMOR MARKER: Alpha-1-Fetoprotein.tumor marker:MCnc:Pt:Ser/Plas:Qn:: 5.3

## 2019-03-15 LAB — TOTAL PROTEIN: Protein:MCnc:Pt:Ser/Plas:Qn:: 7.1

## 2019-03-15 LAB — MONOCYTES RELATIVE PERCENT: Monocytes/100 leukocytes:NFr:Pt:Bld:Qn:Automated count: 5

## 2019-03-16 ENCOUNTER — Telehealth: Admit: 2019-03-16 | Discharge: 2019-03-17 | Payer: MEDICARE | Attending: Gastroenterology | Primary: Gastroenterology

## 2019-03-16 DIAGNOSIS — K754 Autoimmune hepatitis: Secondary | ICD-10-CM

## 2019-03-16 NOTE — Unmapped (Signed)
[facility]    [name], M.D.  Professor of Medicine  Director, [facility]  [place] [facility] [place] [facility] [place]    [phone]    [name]    Referring MD: Midge Minium, MD    Video visit 12/01/18.     Chief complaint: Office follow-up for elevation of liver enzymes. Hospitalization (08/2016) for acute hepatitis of uncertain etiology, presumed autoimmune hepatitis treated with prednisone and mycophenylate. DRUG FEVER ASSOCIATED WITH AZATHIOPRINE. STEROIDS STOPPED 07/2017. CONTINUES ON CELLCEPT 1 GRAM BID FOR IMMUNOSUPPRESSION    Interval history: Patient continues to feel great. She denies fever, cough, sob, N/V, abdominal pain, chest pain, or skin rash.  She has been maintained on mycophenylate 1gram bid. No new health related concerns. She has been very careful with social distancing.     Present illness:  Patient is a 58 y.o. Caucasian female who was hospitalized at [facility] between approximately February 12 -  August 29, 2016 with acute hepatitis. The patient had about 10 weeks of abnormal liver tests documented by her primary physician and her primary gastroenterologist. This was preceded by a short flu-like illness characterized by fatigue, muscle aches, and low-grade fever. All of the symptoms have completely remained resolved since her  Visit 09/10/2016. However she was left with elevated liver tests and had an extensive workup as an outpatient and subsequently as an inpatient at [facility] to further define the etiology of her liver disease.  Additional details of her history and prior outpatient evaluation as well as her inpatient evaluation are available from our initial consultation note and follow-up notes while she was hospitalized.       After review of the liver biopsy and all serologic data thus far which has been unrevealing, it was felt that a therapeutic trial of prednisone for presumed seronegative autoimmune hepatitis was warranted. Prednisone  40 mg of daily started on 08/29/16. She had a good biochemical response with marked improvement in ALT, INR, including a slow but steady decrease in Total/direct bilirubin.     Patient had an extensive serological evaluation which was not revealing:  1. Antinuclear antibody and anti-smooth muscle antibody were negative. Immunoglobulin G was not elevated (1061)  2. Viral serologies including acute hepatitis A, hepatitis B, hepatitis C, HCV RNA, EBV PCR, CMV PCR, HHV-6 were all negative.  3. Liver ultrasound demonstrated patent vasculature with a nodular-appearing heterogeneous liver.  4. Percutaneous liver biopsy 08/2016 at [facility]:    A: Liver, core biopsy   - Severe active hepatitis with confluent areas of periportal hepatocellular necrosis (approximately 10-20% of sampled parenchyma) (see comment)   - Trichrome stain demonstrates at least periportal fibrosis and is suspicious for bridging fibrosis     Potential etiologies to consider include, but are not limited to, infection, drug/toxin-induced liver injury, seronegative autoimmune hepatitis, and metabolic disorders. The trichrome stain findings suggest that this process is at least subacute in duration.      Liver biopsy slides were personally reviewed by Dr. Sharon Mt  with the pathologist.  No viral inclusions were noted and only few plasma cells were visualized in the liver biopsy which was an adequate specimen. No cytopathic effect. EBV immunostains were negative. Fe in hepatocytes only grade 1/4    5.  Evaluation for Zoster: Dr. Foy Guadalajara received phone call from patient's primary GI MD that patient's Zoster IgM from 09/05/2016 was positive with titer 1.17 approximately one week after starting prednisone. Review of her record though revealed this test  to have been negative on 08/29/2016.  Dr. Sharon Mt discussed these findings with Dr. Lytle Butte and the decision was made to proceed with patient starting Valtrex 500 mg bid.  She was seen by Dr. 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   Recommendations from ID: Continue valacyclovir 500 mg bid.  Advised readdressing stopping Valtrex if her repeat IgM is negative or once she is on <15 mg prednisone daily.  Valtrex was discontinued last week.  Bactrim prophylaxis while prednisone >15 mg q 24. 1 DS tablet M/W/F. The following vaccines were recommended for her to obtain under the care of her PCP: Twinrix, PCV13, and PPSV23. She should have these performed by her PCP when prednisone dose is  <20 mg daily.     Immunosuppressive history:  Patient was started on azathioprine for steroid sparing benefits in early April.  After 2 weeks patient developed fevers. ID workup negative during hospitalization.  Azathioprine was held during that hospitalization and fevers resolved. When she restarted azathioprine as outpatient, immediately had high fevers and shaking chills.  This rechallenge was highly suggestive of drug fever from azathioprine.    On 5/12 patient was started on CellCept 500mg  bid and continued on prednisone 15 mg daily.  On 12/03/16, liver enzymes remained elevated. Cellcept was increased to 1000mg  BID on 12/04/16. Prednisone tapered to off as of 07/2017.     10 sys ROS otherwise negative.     Past medical history:  Patient has generally been healthy until the current hepatitis began in December 2017.  1. No history of diabetes, coronary artery disease, hypertension, or lung disease.  2. No history of other autoimmune systemic diseases  3. FIBROSCAN 11/05/16: 17.7 kps c/w stage F4 (Patient ate breakfast 3 hours prior).  4. MRI 10/2017: HEPATOBILIARY: Nodular liver contour compatible with history of cirrhosis and similar to prior. Again seen is a geographic region of T1 hypointensity and corresponding slight T2 hyperintensity in the posterior right hepatic lobe which is overall similar in extent when compared to prior imaging. This region demonstrates progressive postcontrast enhancement as before. Overall similar degree of capsular retraction most pronounced along the right anterior margin of the liver (13:29). No discrete early arterially enhancing or washout liver lesion identified. No biliary ductal dilatation. Similar appearance and position of the borderline hydropic gallbladder.  PANCREAS: Pancreas divisum. No ductal dilatation.    5. MRI 11/2018: Morphologic features consistent with history of cirrhosis. No sequela of portal hypertension.No MR evidence of HCC.  Similar appearing probable confluent fibrosis in the right hepatic lobe.    Allergies   Allergen Reactions   ??? Azathioprine Other (See Comments)     Fever       Current Outpatient Medications   Medication Sig Dispense Refill   ??? mycophenolate (CELLCEPT) 500 mg tablet Take 2 tablets (1,000 mg total) by mouth 2 (two) times a day with meals. 120 tablet 5   ??? ibuprofen (MOTRIN) 600 MG tablet Take 600 mg by mouth every six (6) hours as needed.     ??? ketoconazole (NIZORAL) 2 % cream Apply 1 application topically Two (2) times a day. To affected areas as directed (Patient not taking: Reported on 11/02/2017) 60 g 1   ??? mycophenolate (CELLCEPT) 500 mg tablet Take 2 tablets (1,000 mg total) by mouth Two (2) times a day. (Patient not taking: Reported on 03/15/2019) 120 tablet 5     No current facility-administered medications for  this visit.      Social history: The patient is married. She works as a Materials engineer. They have traveled extensively.   She does not drink any alcohol. She does not smoke cigarettes.    Family history: Negative for liver disease or liver cancer. Negative for autoimmune systemic diseases.    Physical Examination: No exam performed as this was Phone visit.      Laboratory Studies:     Results for orders placed or performed in visit on 03/14/19   CBC w/ Differential   Result Value Ref Range    WBC 7.2 3.4 - 10.8 x10E3/uL    RBC 4.27 3.77 - 5.28 x10E6/uL    HGB 13.4 11.1 - 15.9 g/dL HCT 29.5 62.1 - 30.8 %    MCV 94 79 - 97 fL    MCH 31.4 26.6 - 33.0 pg    MCHC 33.3 31.5 - 35.7 g/dL    RDW 65.7 84.6 - 96.2 %    Platelet 194 150 - 450 x10E3/uL    Neutrophils % 69 Not Estab. %    Lymphocytes % 24 Not Estab. %    Monocytes % 5 Not Estab. %    Eosinophils % 1 Not Estab. %    Basophils % 1 Not Estab. %    Absolute Neutrophils 5.0 1.4 - 7.0 x10E3/uL    Absolute Lymphocytes 1.7 0.7 - 3.1 x10E3/uL    Absolute Monocytes  0.4 0.1 - 0.9 x10E3/uL    Absolute Eosinophils 0.1 0.0 - 0.4 x10E3/uL    Absolute Basophils  0.0 0.0 - 0.2 x10E3/uL    Immature Granulocytes 0 Not Estab. %    Bands Absolute 0.0 0.0 - 0.1 x10E3/uL   Comprehensive Metabolic Panel   Result Value Ref Range    Glucose 126 (H) 65 - 99 mg/dL    BUN 13 8 - 27 mg/dL    Creatinine 9.52 8.41 - 1.00 mg/dL    BUN/Creatinine Ratio 17 12 - 28    Sodium 139 134 - 144 mmol/L    Potassium 4.1 3.5 - 5.2 mmol/L    Chloride 101 96 - 106 mmol/L    CO2 24 20 - 29 mmol/L    Calcium 9.5 8.7 - 10.3 mg/dL    Total Protein 7.1 6.0 - 8.5 g/dL    Albumin 5.1 (H) 3.8 - 4.9 g/dL    Globulin, Total 2.0 1.5 - 4.5 g/dL    A/G Ratio 2.6 (H) 1.2 - 2.2    Total Bilirubin 0.3 0.0 - 1.2 mg/dL    Alkaline Phosphatase 62 39 - 117 IU/L    AST 21 0 - 40 IU/L    ALT 19 0 - 32 IU/L   AFP tumor marker   Result Value Ref Range    AFP-Tumor Marker 5.3 0.0 - 8.3 ng/mL       Impression:  1. Seronegative autoimmune hepatitis based on response to immunosuppressive medication. The patient presented with severe hepatitis as evidenced by hyperbilirubinemia, elevation of her INR, decreased albumin, and results from her liver biopsy. Patient was started on a therapeutic trial of prednisone for the reasons discussed above. After only 4 days of immunosuppression, her liver enzymes (ALT)  that had been regularly elevated above 1000 decreased substantially.  This was highly suggestive of a positive effect of the prednisone and was c/w  presumed ANA-negative/SMA-negative autoimmune hepatitis.Labs showed continued normalization of bilirubin and INR, although she remained with mildly elevated ALT in the 40-45 range. CellCept was added on  5/12 and increased to 1000mg  BID on 12/04/16.Steroids tapered to off as of 07/2017.  We will maintain current level of CellCept.  ALT normal.  Patient feels completely well.     2. Cirrhosis, well compensated:  FIBROSCAN suggestive of cirrhosis and MRI suggestive of nodular contour with focal fibrosis.  Liver biopsy during acute episode did demonstrate possible bridging fibrosis. Inflammation now resolved.Defer repeat liver biopsy but may reconsider after prolonged remission to reevaluate, not sure it will change management at this point.     3. HCC surveillance: For presumed cirrhosis as described above. 11/2018-MRI with evidence of cirrhosis. No evidence of mass. Unchanged from prior. Repeat in 11/20 .      4. Drug fevers from Azathioprine: resolved, no recurrent fever.    5. Mild thrombocytopenia: Resolved. Normal size spleen.     4. Twinrix x 3 completed.     5. . Vaccinations: Completed Shingrix. She will get pneumovax at PCP.     6. Lymphopenia: Resolved.    RTC 3 months. MRI and labs ordered for same time.     [name], M.D.  Professor of Medicine  Director, [facility]  [place] [facility] [place] [facility] [place]    [phone]

## 2019-03-16 NOTE — Unmapped (Addendum)
Doing great on CellCept. Continue indefinitely. Liver enzymes normal on recent labs. MRI scheduled for November with repeat labs and office/video visit around that time.

## 2019-04-05 NOTE — Unmapped (Signed)
[facility] Refill Coordination Note    Specialty Medication(s) to be Shipped:   General Specialty: mycophenolate 500mg     Other medication(s) to be shipped: N/A     Margaret Berger, DOB: 1960-09-20  Phone: There are no phone numbers on file.      All above HIPAA information was verified with patient.     Completed refill call assessment today to schedule patient's medication shipment from the [facility] [phone]).       Specialty medication(s) and dose(s) confirmed: Regimen is correct and unchanged.   Changes to medications: [name] reports no changes at this time.  Changes to insurance: No  Questions for the pharmacist: No    Confirmed patient received Welcome Packet with first shipment. The patient will receive a drug information handout for each medication shipped and additional FDA Medication Guides as required.       DISEASE/MEDICATION-SPECIFIC INFORMATION        N/A    SPECIALTY MEDICATION ADHERENCE     Medication Adherence    Patient reported X missed doses in the last month: 0  Specialty Medication: Mycophenolate 500mg   Patient is on additional specialty medications: No          Mycophenolate 500 mg: 10 days of medicine on hand     SHIPPING     Shipping address confirmed in Epic.     Delivery Scheduled: Yes, Expected medication delivery date: 04/12/2019.     Medication will be delivered via UPS to the prescription address in Epic [facility].    Margaret Berger [facility] Specialty Technician

## 2019-04-11 MED FILL — MYCOPHENOLATE MOFETIL 500 MG TABLET: ORAL | 30 days supply | Qty: 120 | Fill #1

## 2019-04-11 MED FILL — MYCOPHENOLATE MOFETIL 500 MG TABLET: 30 days supply | Qty: 120 | Fill #1 | Status: AC

## 2019-05-10 NOTE — Unmapped (Signed)
[facility] Clinical Assessment & Refill Coordination Note    Margaret Berger, DOB: 1960-10-10  Phone: [phone] (home)     All above HIPAA information was verified with patient.     Specialty Medication(s):   General Specialty: mycophenolate 500mg      Current Outpatient Medications   Medication Sig Dispense Refill   ??? ibuprofen (MOTRIN) 600 MG tablet Take 600 mg by mouth every six (6) hours as needed.     ??? ketoconazole (NIZORAL) 2 % cream Apply 1 application topically Two (2) times a day. To affected areas as directed (Patient not taking: Reported on 11/02/2017) 60 g 1   ??? mycophenolate (CELLCEPT) 500 mg tablet Take 2 tablets (1,000 mg total) by mouth Two (2) times a day. (Patient not taking: Reported on 03/15/2019) 120 tablet 5   ??? mycophenolate (CELLCEPT) 500 mg tablet Take 2 tablets (1,000 mg total) by mouth 2 (two) times a day with meals. 120 tablet 5     No current facility-administered medications for this visit.         Changes to medications: Nekeisha reports no changes at this time.    Allergies   Allergen Reactions   ??? Azathioprine Other (See Comments)     Fever       Changes to allergies: No    SPECIALTY MEDICATION ADHERENCE     mycophenolate 500 mg: 10 days of medicine on hand     Specialty medication(s) dose(s) confirmed: Regimen is correct and unchanged.     Are there any concerns with adherence? No    Adherence counseling provided? Not needed    CLINICAL MANAGEMENT AND INTERVENTION      Clinical Benefit Assessment:    Do you feel the medicine is effective or helping your condition? Yes    Clinical Benefit counseling provided? Not needed    Adverse Effects Assessment:    Are you experiencing any side effects? No    Are you experiencing difficulty administering your medicine? No    Quality of Life Assessment: How many days over the past month did your Autoimmune hepatitis keep you from your normal activities? For example, brushing your teeth or getting up in the morning. 0    Have you discussed this with your provider? Not needed    Therapy Appropriateness:    Is therapy appropriate? Yes, therapy is appropriate and should be continued    DISEASE/MEDICATION-SPECIFIC INFORMATION      N/A    PATIENT SPECIFIC NEEDS     ? Does the patient have any physical, cognitive, or cultural barriers? No    ? Is the patient high risk? No     ? Does the patient require a Care Management Plan? No     ? Does the patient require physician intervention or other additional services (i.e. nutrition, smoking cessation, social work)? No      SHIPPING     Specialty Medication(s) to be Shipped:   General Specialty: mycophenolate 500mg     Other medication(s) to be shipped: none     Changes to insurance: No    Delivery Scheduled: Yes, Expected medication delivery date: 05/16/19.     Medication will be delivered via Same Day Courier to the confirmed prescription address in [facility].    The patient will receive a drug information handout for each medication shipped and additional FDA Medication Guides as required.  Verified that patient has previously received a Conservation officer, historic buildings.    All of the patient's questions  and concerns have been addressed.    Breck Coons [facility] Specialty Pharmacist

## 2019-05-16 MED FILL — MYCOPHENOLATE MOFETIL 500 MG TABLET: ORAL | 30 days supply | Qty: 120 | Fill #2

## 2019-05-16 MED FILL — MYCOPHENOLATE MOFETIL 500 MG TABLET: 30 days supply | Qty: 120 | Fill #2 | Status: AC

## 2019-06-03 NOTE — Unmapped (Addendum)
[facility] Refill Coordination Note    Specialty Medication(s) to be Shipped:   Transplant: mycophenolate mofetil 500mg     Other medication(s) to be shipped:      Margaret Berger, DOB: 04/10/1961  Phone: [phone] (home)       All above HIPAA information was verified with patient.     Completed refill call assessment today to schedule patient's medication shipment from the [facility] [phone]).       Specialty medication(s) and dose(s) confirmed: Regimen is correct and unchanged.   Changes to medications: Zariana reports no changes at this time.  Changes to insurance: No  Questions for the pharmacist: No    Confirmed patient received Welcome Packet with first shipment. The patient will receive a drug information handout for each medication shipped and additional FDA Medication Guides as required.       DISEASE/MEDICATION-SPECIFIC INFORMATION        N/A    SPECIALTY MEDICATION ADHERENCE     Medication Adherence    Patient reported X missed doses in the last month: 0  Specialty Medication: MYCOPHENOLATE 500MG    Patient is on additional specialty medications: No  Informant: patient  Confirmed plan for next specialty medication refill: delivery by pharmacy  Refills needed for supportive medications: not needed          Refill Coordination    Has the Patients' Contact Information Changed: No  Is the Shipping Address Different: Yes           MYCOPHENOLATE 500 mg: 9 days of medicine on hand         SHIPPING     Shipping address confirmed in Epic.     Delivery Scheduled: Yes, Expected medication delivery date: 11/24.     Medication will be delivered via Same Day Courier to the prescription address in Epic W[facility].    Jolene Schimke   [facility] Specialty Technician

## 2019-06-07 ENCOUNTER — Encounter: Admit: 2019-06-07 | Discharge: 2019-06-08 | Payer: PRIVATE HEALTH INSURANCE

## 2019-06-07 DIAGNOSIS — K754 Autoimmune hepatitis: Secondary | ICD-10-CM | POA: Diagnosis not present

## 2019-06-07 DIAGNOSIS — B1920 Unspecified viral hepatitis C without hepatic coma: Secondary | ICD-10-CM | POA: Diagnosis not present

## 2019-06-07 DIAGNOSIS — K7460 Unspecified cirrhosis of liver: Secondary | ICD-10-CM | POA: Diagnosis not present

## 2019-06-07 DIAGNOSIS — K769 Liver disease, unspecified: Secondary | ICD-10-CM | POA: Diagnosis not present

## 2019-06-07 DIAGNOSIS — Z79899 Other long term (current) drug therapy: Principal | ICD-10-CM

## 2019-06-07 MED FILL — MYCOPHENOLATE MOFETIL 500 MG TABLET: 30 days supply | Qty: 120 | Fill #3 | Status: AC

## 2019-06-07 MED FILL — MYCOPHENOLATE MOFETIL 500 MG TABLET: ORAL | 30 days supply | Qty: 120 | Fill #3

## 2019-06-07 NOTE — Unmapped (Signed)
Patient called and asked if lab orders could be entered for [facility]. She has appt tomorrow with Dr. Foy Guadalajara and wants to get the labs today. I reviewed the chart and entered the orders.

## 2019-06-08 ENCOUNTER — Encounter
Admit: 2019-06-08 | Discharge: 2019-06-09 | Payer: PRIVATE HEALTH INSURANCE | Attending: Gastroenterology | Primary: Gastroenterology

## 2019-06-08 DIAGNOSIS — K754 Autoimmune hepatitis: Secondary | ICD-10-CM | POA: Diagnosis not present

## 2019-06-08 DIAGNOSIS — N83209 Unspecified ovarian cyst, unspecified side: Secondary | ICD-10-CM

## 2019-06-08 NOTE — Unmapped (Signed)
Doing well on CellCept. Continue indefinitely. MRI liver looked fine. Incidental finding of possible ovarian cyst-arrange follow-up with your GYN physician. OK to receive COVID vaccinations when they become available. Return visit in 3 months with labs prior to visit. Repeat MRI ordered for 11/2019.

## 2019-06-08 NOTE — Unmapped (Signed)
[facility] LIVER CENTER    [name], M.D.  Professor of Medicine  Director, [facility]  [place] [facility] [place] [facility] [place]    [phone]    [name]    Referring MD: Midge Minium, MD    Phone visit 06/08/19     Chief complaint: Office follow-up for elevation of liver enzymes. Hospitalization (08/2016) for acute hepatitis of uncertain etiology, presumed autoimmune hepatitis treated with prednisone and mycophenylate. DRUG FEVER ASSOCIATED WITH AZATHIOPRINE. STEROIDS STOPPED 07/2017. CONTINUES ON CELLCEPT 1 GRAM BID FOR IMMUNOSUPPRESSION    Interval history: Patient continues to feel great. She denies fever, cough, sob, N/V, abdominal pain, chest pain, or skin rash.  She has been maintained on mycophenylate 1gram bid. No new health related concerns. She has been very careful with COVID Precautions.     Present illness at presentation:  Patient is a 58 y.o. Caucasian female who was hospitalized at [facility] between approximately February 12 -  August 29, 2016 with acute hepatitis. The patient had about 10 weeks of abnormal liver tests documented by her primary physician and her primary gastroenterologist. This was preceded by a short flu-like illness characterized by fatigue, muscle aches, and low-grade fever. All of the symptoms have completely remained resolved since her  Visit 09/10/2016. However she was left with elevated liver tests and had an extensive workup as an outpatient and subsequently as an inpatient at [facility] to further define the etiology of her liver disease.  Additional details of her history and prior outpatient evaluation as well as her inpatient evaluation are available from our initial consultation note and follow-up notes while she was hospitalized.       After review of the liver biopsy and all serologic data thus far which has been unrevealing, it was felt that a therapeutic trial of prednisone for presumed seronegative autoimmune hepatitis was warranted. Prednisone  40 mg of daily started on 08/29/16. She had a good biochemical response with marked improvement in ALT, INR, including a slow but steady decrease in Total/direct bilirubin.     Patient had an extensive serological evaluation which was not revealing:  1. Antinuclear antibody and anti-smooth muscle antibody were negative. Immunoglobulin G was not elevated (1061)  2. Viral serologies including acute hepatitis A, hepatitis B, hepatitis C, HCV RNA, EBV PCR, CMV PCR, HHV-6 were all negative.  3. Liver ultrasound demonstrated patent vasculature with a nodular-appearing heterogeneous liver.  4. Percutaneous liver biopsy 08/2016 at [facility]:    A: Liver, core biopsy   - Severe active hepatitis with confluent areas of periportal hepatocellular necrosis (approximately 10-20% of sampled parenchyma) (see comment)   - Trichrome stain demonstrates at least periportal fibrosis and is suspicious for bridging fibrosis     Potential etiologies to consider include, but are not limited to, infection, drug/toxin-induced liver injury, seronegative autoimmune hepatitis, and metabolic disorders. The trichrome stain findings suggest that this process is at least subacute in duration.      Liver biopsy slides were personally reviewed by Dr. Sharon Mt  with the pathologist.  No viral inclusions were noted and only few plasma cells were visualized in the liver biopsy which was an adequate specimen. No cytopathic effect. EBV immunostains were negative. Fe in hepatocytes only grade 1/4    5.  Evaluation for Zoster: Dr. Foy Guadalajara received phone call from patient's primary GI MD that patient's Zoster IgM from 09/05/2016 was positive with titer 1.17 approximately one week after starting prednisone. Review of her record though revealed  this test to have been negative on 08/29/2016.  Dr. Sharon Mt discussed these findings with Dr. Lytle Butte and the decision was made to proceed with patient starting Valtrex 500 mg bid.  She was seen by Dr. 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   Recommendations from ID: Continue valacyclovir 500 mg bid.  Advised readdressing stopping Valtrex if her repeat IgM is negative or once she is on <15 mg prednisone daily.  Valtrex was discontinued last week.  Bactrim prophylaxis while prednisone >15 mg q 24. 1 DS tablet M/W/F. The following vaccines were recommended for her to obtain under the care of her PCP: Twinrix, PCV13, and PPSV23. She should have these performed by her PCP when prednisone dose is  <20 mg daily.     Immunosuppressive history:  Patient was started on azathioprine for steroid sparing benefits in early April.  After 2 weeks patient developed fevers. ID workup negative during hospitalization.  Azathioprine was held during that hospitalization and fevers resolved. When she restarted azathioprine as outpatient, immediately had high fevers and shaking chills.  This rechallenge was highly suggestive of drug fever from azathioprine.    On 5/12 patient was started on CellCept 500mg  bid and continued on prednisone 15 mg daily.  On 12/03/16, liver enzymes remained elevated. Cellcept was increased to 1000mg  BID on 12/04/16. Prednisone tapered to off as of 07/2017.     10 sys ROS otherwise negative.     Past medical history:  Patient has generally been healthy until the current hepatitis began in December 2017.  1. No history of diabetes, coronary artery disease, hypertension, or lung disease.  2. No history of other autoimmune systemic diseases  3. FIBROSCAN 11/05/16: 17.7 kps c/w stage F4 (Patient ate breakfast 3 hours prior).  4. MRI 10/2017: HEPATOBILIARY: Nodular liver contour compatible with history of cirrhosis and similar to prior. Again seen is a geographic region of T1 hypointensity and corresponding slight T2 hyperintensity in the posterior right hepatic lobe which is overall similar in extent when compared to prior imaging. This region demonstrates progressive postcontrast enhancement as before. Overall similar degree of capsular retraction most pronounced along the right anterior margin of the liver (13:29). No discrete early arterially enhancing or washout liver lesion identified. No biliary ductal dilatation. Similar appearance and position of the borderline hydropic gallbladder.  PANCREAS: Pancreas divisum. No ductal dilatation.    5. MRI 05/2019: Morphologic features consistent with history of cirrhosis. No sequela of portal hypertension.No MR evidence of HCC.  Similar appearing probable confluent fibrosis in the right hepatic lobe. Incidental right adnexal cyst.    Allergies   Allergen Reactions   ??? Azathioprine Other (See Comments)     Fever  Other reaction(s): Other (See Comments)  Fever       Current Outpatient Medications   Medication Sig Dispense Refill   ??? ibuprofen (MOTRIN) 600 MG tablet Take 600 mg by mouth every six (6) hours as needed.     ??? ketoconazole (NIZORAL) 2 % cream Apply 1 application topically Two (2) times a day. To affected areas as directed (Patient not taking: Reported on 11/02/2017) 60 g 1   ??? mycophenolate (CELLCEPT) 500 mg tablet Take 2 tablets (1,000 mg total) by mouth Two (2) times a day. (Patient not taking: Reported on 03/15/2019) 120 tablet 5   ??? mycophenolate (CELLCEPT) 500 mg tablet Take 2 tablets (1,000 mg total) by mouth 2 (two) times a day  with meals. 120 tablet 5     No current facility-administered medications for this visit.      Social history: The patient is married. She works as a Materials engineer. They have traveled extensively.   She does not drink any alcohol. She does not smoke cigarettes.    Family history: Negative for liver disease or liver cancer. Negative for autoimmune systemic diseases.    Physical Examination: No exam performed as this was Phone visit.      Laboratory Studies: Pending    Impression:  1. Seronegative autoimmune hepatitis based on response to immunosuppressive medication. The patient presented with severe hepatitis as evidenced by hyperbilirubinemia, elevation of her INR, decreased albumin, and results from her liver biopsy. Patient was started on a therapeutic trial of prednisone for the reasons discussed above. After only 4 days of immunosuppression, her liver enzymes (ALT)  that had been regularly elevated above 1000 decreased substantially.  This was highly suggestive of a positive effect of the prednisone and was c/w  presumed ANA-negative/SMA-negative autoimmune hepatitis.Labs showed continued normalization of bilirubin and INR, although she remained with mildly elevated ALT in the 40-45 range. CellCept was added on 5/12 and increased to 1000mg  BID on 12/04/16.Steroids tapered to off as of 07/2017.  We will maintain current level of CellCept.  ALT normal.  Patient feels completely well.     2. Cirrhosis, well compensated:  FIBROSCAN suggestive of cirrhosis and MRI suggestive of nodular contour with focal fibrosis.  Liver biopsy during acute episode did demonstrate possible bridging fibrosis. Inflammation now resolved.Defer repeat liver biopsy but may reconsider after prolonged remission to reevaluate, as will not change management at this point.     3. HCC surveillance: Findings of irrhosis as described above. 05/2019-MRI with evidence of cirrhosis. Unchanged area of focal fibrosis.  No evidence of enhancing mass. Unchanged from prior. Repeat in 5/21 .      4. Drug fevers from Azathioprine: resolved, no recurrent fever.    5. Mild thrombocytopenia: Resolved. Normal size spleen.     4. Twinrix x 3 completed.     5. . Vaccinations: Completed Shingrix. She will get pneumovax at PCP.     6. Lymphopenia: Resolved.    7. Incidental adnexal cyst seen on MRI: Patient to follow-up with PCP and GYN.     8. OK for COVID vaccination once they become available.     RTC 3 months. MRI and labs ordered for same time.     [name], M.D.  Professor of Medicine  Director, [facility]  [place] [facility] [place] [facility] [place]    [phone]    I spent 10 minutes on the phone with the patient. I spent an additional 10 minutes on pre- and post-visit activities.     The patient was physically located in [place] or a state in which I am permitted to provide care. The patient and/or parent/guardian understood that s/he may incur co-pays and cost sharing, and agreed to the telemedicine visit. The visit was reasonable and appropriate under the circumstances given the patient's presentation at the time.    The patient and/or parent/guardian has been advised of the potential risks and limitations of this mode of treatment (including, but not limited to, the absence of in-person examination) and has agreed to be treated using telemedicine. The patient's/patient's family's questions regarding telemedicine have been answered.     If the visit was completed in an ambulatory setting, the patient and/or parent/guardian has also been  advised to contact their provider???s office for worsening conditions, and seek emergency medical treatment and/or call 911 if the patient deems either necessary.

## 2019-06-13 ENCOUNTER — Telehealth: Payer: Self-pay | Admitting: Obstetrics and Gynecology

## 2019-06-13 NOTE — Telephone Encounter (Signed)
I was asked by a family member of the patient to review her MRI from [facility] that according to the family member shows an ovarian mass. I contacted the patient prior to reviewing in specific medical information. I spoke with her by phone and she indicated that she gave permission to review her MRI from [facility] and any pertinent information to that in order to determine what next steps should be taken wand when.  I will follow up with her.  She also gave me verbal permission to speak with her brother Anda Latina, MD, regarding my recommendations.

## 2019-06-14 ENCOUNTER — Other Ambulatory Visit: Payer: Self-pay | Admitting: Obstetrics and Gynecology

## 2019-06-14 DIAGNOSIS — N838 Other noninflammatory disorders of ovary, fallopian tube and broad ligament: Secondary | ICD-10-CM

## 2019-06-14 DIAGNOSIS — K754 Autoimmune hepatitis: Secondary | ICD-10-CM | POA: Diagnosis not present

## 2019-06-15 DIAGNOSIS — Z03818 Encounter for observation for suspected exposure to other biological agents ruled out: Secondary | ICD-10-CM | POA: Diagnosis not present

## 2019-06-16 LAB — CBC W/ DIFFERENTIAL
BANDED NEUTROPHILS ABSOLUTE COUNT: 0 10*3/uL (ref 0–0)
BASOPHILS RELATIVE PERCENT: 0.4 % (ref 0–2)
EOSINOPHILS ABSOLUTE COUNT: 0.1 10*3/uL (ref 0.0–0.5)
EOSINOPHILS RELATIVE PERCENT: 1.7 % (ref 0.0–6.0)
HEMATOCRIT: 40.3 % (ref 33–47)
HEMOGLOBIN: 12.9 g/dL (ref 11.0–16.0)
IMMATURE GRANULOCYTES: 0.1 % — ABNORMAL HIGH (ref 0–0)
LYMPHOCYTES RELATIVE PERCENT: 24.3 % (ref 15.0–49.0)
MEAN CORPUSCULAR HEMOGLOBIN CONC: 32 g/dL (ref 32.0–36.0)
MEAN CORPUSCULAR HEMOGLOBIN: 30.9 pg (ref 27.0–31.0)
MEAN CORPUSCULAR VOLUME: 96.6 fL (ref 80.0–100.0)
MONOCYTES ABSOLUTE COUNT: 0.4 10*3/uL (ref 0.3–0.9)
NEUTROPHILS ABSOLUTE COUNT: 4.8 10*3/uL (ref 1.4–6.5)
NEUTROPHILS RELATIVE PERCENT: 68.5 % (ref 37.0–74.0)
NUCLEATED RED BLOOD CELLS: 0 % (ref 0.0–0.0)
PLATELET COUNT: 167 10*3/uL (ref 130–400)
RED BLOOD CELL COUNT: 4.17 10*6/uL (ref 3.70–5.30)
RED CELL DISTRIBUTION WIDTH: 12.8 % (ref 12.8–16.4)
WHITE BLOOD CELL COUNT: 7 10*3/uL (ref 4.0–10.0)

## 2019-06-16 LAB — PROTIME-INR: INR: 1 SU (ref 0.9–1.1)

## 2019-06-16 LAB — COMPREHENSIVE METABOLIC PANEL
A/G RATIO: 1.5 (ref 1.0–2.2)
ALBUMIN: 4.3 g/dL (ref 3.4–5.0)
ALKALINE PHOSPHATASE: 60 IU/L (ref 45–117)
ALT (SGPT): 29 IU/L (ref 12–78)
CALCIUM: 9.4 mg/dL (ref 8.5–10.1)
CHLORIDE: 107 mmol/L (ref 98–107)
CO2: 27 mmol/L (ref 21–32)
CREATININE: 0.8 mg/dL (ref 0.5–1.0)
GFR MDRD NON AF AMER: >60 mL/min/{1.73_m2}
GLOBULIN, TOTAL: 2.8 g/dL (ref 2.5–4.5)
GLUCOSE: 130 mg/dL — ABNORMAL HIGH (ref 74–106)
POTASSIUM: 4.3 mmol/L (ref 3.5–5.1)
SODIUM: 140 mmol/L (ref 136–145)
TOTAL PROTEIN: 7.1 g/dL (ref 6.4–8.2)

## 2019-06-16 LAB — GLUCOSE: Glucose:MCnc:Pt:Ser/Plas:Qn:: 130 — ABNORMAL HIGH

## 2019-06-16 LAB — IMMATURE GRANULOCYTES: Granulocytes.immature/100 leukocytes:NFr:Pt:Bld:Qn:Automated count: 0.1 — ABNORMAL HIGH

## 2019-06-16 LAB — PROTHROMBIN TIME: Coagulation tissue factor induced:Time:Pt:PPP:Qn:Coag: 12.9

## 2019-06-16 LAB — AFP-TUMOR MARKER: Alpha-1-Fetoprotein.tumor marker:MCnc:Pt:Ser/Plas:Qn:: 4.4

## 2019-07-01 NOTE — Unmapped (Signed)
[facility] Refill Coordination Note    Specialty Medication(s) to be Shipped:   Transplant: mycophenolate mofetil 500mg     Other medication(s) to be shipped:      [name], DOB: 05/03/1961  Phone: [phone] (home)       All above HIPAA information was verified with patient.     Was a Nurse, learning disability used for this call? No    Completed refill call assessment today to schedule patient's medication shipment from the [facility] [phone]).       Specialty medication(s) and dose(s) confirmed: Regimen is correct and unchanged.   Changes to medications: Madhavi reports no changes at this time.  Changes to insurance: No  Questions for the pharmacist: No    Confirmed patient received Welcome Packet with first shipment. The patient will receive a drug information handout for each medication shipped and additional FDA Medication Guides as required.       DISEASE/MEDICATION-SPECIFIC INFORMATION        N/A    SPECIALTY MEDICATION ADHERENCE     Medication Adherence    Patient reported X missed doses in the last month: 0  Specialty Medication: MYCOPHENOLATE 500MG    Informant: patient  Reliability of informant: reliable  Confirmed plan for next specialty medication refill: delivery by pharmacy          Refill Coordination    Has the Patients' Contact Information Changed: No  Is the Shipping Address Different: Yes  Updated Shipping Address: [address] [place] [address]            MYCOPHENOLATE 500 mg: 12 days of medicine on hand          SHIPPING     Shipping address confirmed in Epic.     Delivery Scheduled: Yes, Expected medication delivery date: 12/22.     Medication will be delivered via Same Day Courier to the prescription address in Epic WAM.    Jolene Schimke   [facility] Specialty Technician

## 2019-07-04 ENCOUNTER — Ambulatory Visit: Payer: Self-pay | Admitting: Obstetrics and Gynecology

## 2019-07-04 ENCOUNTER — Other Ambulatory Visit: Payer: Self-pay

## 2019-07-05 NOTE — Unmapped (Signed)
Medication (Mycophenolate) was refill too soon til 07/07/2019. Patient aware and is ok with medication being sent out 07/11/2019 for estimated delivery of 07/12/2019. Patient confirmed she has not missed or will not miss any doses. She has enough medication to get her thru til 07/12/19.

## 2019-07-11 MED FILL — MYCOPHENOLATE MOFETIL 500 MG TABLET: ORAL | 30 days supply | Qty: 120 | Fill #4

## 2019-07-11 MED FILL — MYCOPHENOLATE MOFETIL 500 MG TABLET: 30 days supply | Qty: 120 | Fill #4 | Status: AC

## 2019-08-04 NOTE — Unmapped (Signed)
[facility] Refill Coordination Note    Specialty Medication(s) to be Shipped:   Transplant: mycophenolate mofetil 500mg     Other medication(s) to be shipped:      [name], DOB: 12/25/1960  Phone: [phone] (home)       All above HIPAA information was verified with patient.     Was a Nurse, learning disability used for this call? No    Completed refill call assessment today to schedule patient's medication shipment from the [facility] [phone]).       Specialty medication(s) and dose(s) confirmed: Regimen is correct and unchanged.   Changes to medications: Samyiah reports no changes at this time.  Changes to insurance: No  Questions for the pharmacist: No    Confirmed patient received Welcome Packet with first shipment. The patient will receive a drug information handout for each medication shipped and additional FDA Medication Guides as required.       DISEASE/MEDICATION-SPECIFIC INFORMATION        N/A    SPECIALTY MEDICATION ADHERENCE     Medication Adherence    Patient reported X missed doses in the last month: 0  Specialty Medication: MYCOPHENOLATE 500MG    Patient is on additional specialty medications: No  Informant: patient  Reliability of informant: reliable  Confirmed plan for next specialty medication refill: delivery by pharmacy  Refills needed for supportive medications: not needed          Refill Coordination    Has the Patients' Contact Information Changed: No  Is the Shipping Address Different: No           MYCOPHENOLATE  500 mg: 12 days of medicine on hand         SHIPPING     Shipping address confirmed in Epic.     Delivery Scheduled: Yes, Expected medication delivery date: 1/27.     Medication will be delivered via Same Day Courier to the prescription address in Epic WAM.    Jolene Schimke   [facility] Specialty Technician

## 2019-08-10 DIAGNOSIS — K759 Inflammatory liver disease, unspecified: Principal | ICD-10-CM

## 2019-08-10 DIAGNOSIS — K754 Autoimmune hepatitis: Principal | ICD-10-CM

## 2019-08-10 NOTE — Unmapped (Signed)
Margaret Berger's MYCOPHENOLATE was rescheduled to be delivered on 1/28, patient called to confirm copay

## 2019-08-11 MED FILL — MYCOPHENOLATE MOFETIL 500 MG TABLET: ORAL | 30 days supply | Qty: 120 | Fill #5

## 2019-08-11 MED FILL — MYCOPHENOLATE MOFETIL 500 MG TABLET: 30 days supply | Qty: 120 | Fill #5 | Status: AC

## 2019-08-15 DIAGNOSIS — N83209 Unspecified ovarian cyst, unspecified side: Secondary | ICD-10-CM

## 2019-08-15 HISTORY — DX: Unspecified ovarian cyst, unspecified side: N83.209

## 2019-08-23 ENCOUNTER — Ambulatory Visit: Payer: BLUE CROSS/BLUE SHIELD

## 2019-08-23 ENCOUNTER — Encounter: Payer: Self-pay | Admitting: Obstetrics and Gynecology

## 2019-08-23 ENCOUNTER — Ambulatory Visit: Payer: Self-pay | Admitting: Obstetrics and Gynecology

## 2019-08-23 ENCOUNTER — Ambulatory Visit (INDEPENDENT_AMBULATORY_CARE_PROVIDER_SITE_OTHER): Payer: BLUE CROSS/BLUE SHIELD | Admitting: Obstetrics and Gynecology

## 2019-08-23 ENCOUNTER — Ambulatory Visit: Payer: Self-pay

## 2019-08-23 ENCOUNTER — Other Ambulatory Visit: Payer: Self-pay

## 2019-08-23 VITALS — BP 118/78 | Ht 66.0 in | Wt 152.0 lb

## 2019-08-23 DIAGNOSIS — R9389 Abnormal findings on diagnostic imaging of other specified body structures: Secondary | ICD-10-CM

## 2019-08-23 DIAGNOSIS — N838 Other noninflammatory disorders of ovary, fallopian tube and broad ligament: Secondary | ICD-10-CM | POA: Diagnosis not present

## 2019-08-23 DIAGNOSIS — D252 Subserosal leiomyoma of uterus: Secondary | ICD-10-CM

## 2019-08-23 DIAGNOSIS — N83202 Unspecified ovarian cyst, left side: Secondary | ICD-10-CM

## 2019-08-23 NOTE — Progress Notes (Signed)
Obstetrics & Gynecology Office Visit   Chief Complaint  Patient presents with  . Ultrasound for adnexal cyst noted on MRI at [facility] in 05/2019.    History of Present Illness: 59 y.o. G3P3 female who presents to discuss management options for an incidentally noted left ovarian cyst on MRI at [facility] in November. She has a history notable for autoimmune hepatitis, which is under good control at this point, per patient report. She undergoes MRI for this every six months at [facility].  In November she had a cyst noted that appeared to be 5.7 cm. She notes no symptoms from this.   She underwent a pelvic ultrasound today to better characterize this cyst and to assess for resolution of the cyst. In short, the ultrasound shows a simple-appearing left ovarian cyst measuring 5.8 x 4.4 x 5.4 cm.  There is no blood flow on doppler interrogation.  There is no free fluid in the cul-de-sac.  Past Medical History:  Diagnosis Date  . Chest pain   . HLD (hyperlipidemia)   . Papilloma of breast    right breast    Past Surgical History:  Procedure Laterality Date  . BREAST BIOPSY Right 12/2012   neg stereo/ clip  . BREAST EXCISIONAL BIOPSY Right 1997   neg  . BREAST SURGERY Right 1997   papilloma     Gynecologic History: No LMP recorded. Patient is postmenopausal.  Obstetric History: G3P3  Family History  Problem Relation Age of Onset  . Heart attack Father        heart transplant  . Heart disease Paternal Grandmother   . Heart disease Maternal Grandmother   . Breast cancer Neg Hx     Social History   Socioeconomic History  . Marital status: Married    Spouse name: Not on file  . Number of children: Not on file  . Years of education: Not on file  . Highest education level: Not on file  Occupational History  . Not on file  Tobacco Use  . Smoking status: Never Smoker  . Smokeless tobacco: Never Used  . Tobacco comment: tobacco use - no  Substance and Sexual Activity  . Alcohol use: Yes  .  Drug use: No  . Sexual activity: Yes    Birth control/protection: Post-menopausal  Other Topics Concern  . Not on file  Social History Narrative   Married, full time.    Social Determinants of Health   Financial Resource Strain:   . Difficulty of Paying Living Expenses: Not on file  Food Insecurity:   . Worried About Charity fundraiser in the Last Year: Not on file  . Ran Out of Food in the Last Year: Not on file  Transportation Needs:   . Lack of Transportation (Medical): Not on file  . Lack of Transportation (Non-Medical): Not on file  Physical Activity:   . Days of Exercise per Week: Not on file  . Minutes of Exercise per Session: Not on file  Stress:   . Feeling of Stress : Not on file  Social Connections:   . Frequency of Communication with Friends and Family: Not on file  . Frequency of Social Gatherings with Friends and Family: Not on file  . Attends Religious Services: Not on file  . Active Member of Clubs or Organizations: Not on file  . Attends Archivist Meetings: Not on file  . Marital Status: Not on file  Intimate Partner Violence:   . Fear of Current or Ex-Partner:  Not on file  . Emotionally Abused: Not on file  . Physically Abused: Not on file  . Sexually Abused: Not on file    Allergies  Allergen Reactions  . Azathioprine     Other reaction(s): Other (See Comments) Fever    Prior to Admission medications   Medication Sig Start Date End Date Taking? Authorizing Provider  mycophenolate (CELLCEPT) 500 MG tablet  12/02/17  Yes [provider]  mycophenolate (CELLCEPT) 500 MG tablet SMARTSIG:2 Tablet(s) By Mouth Twice Daily 07/07/19   [provider]    Review of Systems  Constitutional: Negative.   HENT: Negative.   Eyes: Negative.   Respiratory: Negative.   Cardiovascular: Negative.   Gastrointestinal: Negative.   Genitourinary: Negative.   Musculoskeletal: Negative.   Skin: Negative.   Neurological: Negative.     Psychiatric/Behavioral: Negative.      Physical Exam BP 118/78   Ht 5\' 6"  (1.676 m)   Wt 152 lb (68.9 kg)   BMI 24.53 kg/m  No LMP recorded. Patient is postmenopausal. Physical Exam Constitutional:      General: She is not in acute distress.    Appearance: Normal appearance. She is well-developed.  HENT:     Head: Normocephalic and atraumatic.  Eyes:     General: No scleral icterus.    Conjunctiva/sclera: Conjunctivae normal.  Cardiovascular:     Rate and Rhythm: Normal rate and regular rhythm.     Heart sounds: No murmur. No friction rub. No gallop.   Pulmonary:     Effort: Pulmonary effort is normal. No respiratory distress.     Breath sounds: Normal breath sounds. No wheezing or rales.  Abdominal:     General: Bowel sounds are normal. There is no distension.     Palpations: Abdomen is soft. There is no mass.     Tenderness: There is no abdominal tenderness. There is no guarding or rebound.  Musculoskeletal:        General: Normal range of motion.     Cervical back: Normal range of motion and neck supple.  Neurological:     General: No focal deficit present.     Mental Status: She is alert and oriented to person, place, and time.     Cranial Nerves: No cranial nerve deficit.  Skin:    General: Skin is warm and dry.     Findings: No erythema.  Psychiatric:        Mood and Affect: Mood normal.        Behavior: Behavior normal.        Judgment: Judgment normal.    Imaging Results US Transvaginal Non-OB [facility])  Result Date: 08/23/2019 Patient Name: Jessica Zuniga DOB: October 23, 1960 MRN: [id number] ULTRASOUND REPORT Location: [facility] Date of Service: 08/23/2019 Indications:Adnexal Mass Findings: The uterus is anteverted and measures 5.8 x 4.0 x 3.2 cm. Echo texture is heterogenous with evidence of focal masses. Within the uterus are multiple suspected fibroids measuring: Fibroid 1: 12.9 x 12.9 x 15.7 mm subserosal left Fibroid 2: 12.3 x 10.9 x 16.0 mm subserosal fundal  The Endometrium measures 6.0 mm. Right Ovary measures 2.2 x 1.5 x 1.1  cm. It is normal in appearance. Left Ovary measures 5.8 x 6.3 x 4.5 cm. It is not normal in appearance. There is a simple cyst in the left ovary measuring 57.7 x 43.9 x 54.1 mm. No blood flow is seen within the cyst. Survey of the adnexa demonstrates no adnexal masses. There is no free fluid in  the cul de sac. Impression: 1. There are two uterine fibroids seen. 2. There is a thick endometrial lining. 3. Normal appearing right ovary. 4. There is a 5.8 cm cyst with anechoic fluid seen in the left ovary. Gweneth Dimitri, RT The ultrasound images and findings were reviewed by me and I agree with the above report. Prentice Docker, MD, [facility], [facility] 08/23/2019 5:09 PM       Assessment: 59 y.o. G3P3 female here for    ICD-10-CM   1. Left ovarian cyst  N83.202       Plan: Problem List Items Addressed This Visit    None    Visit Diagnoses    Left ovarian cyst    -  Primary     We discussed management options for her simple-appearing left ovarian cyst. Overwhelmingly these are benign.  Most will regress if simply monitored.  She has not had a significant change in the size of the cyst over the past 10 weeks.  Discussed giving the cyst another couple of months to resolve and repeat an ultrasound at that time. If the cyst is not resolved at that time, will proceed to removal.  We also discussed proceeding to removal at this time, given that it has not resolved in 10 weeks and does not appear to have changed in size in that same time period.  My recommendation to her is to go ahead and remove the cyst.  If the cyst is in the ovary, will remove the ovary along with the cyst. Discussed that while this is a very likely benign process, there is the chance of malignancy and I will not know exactly what is going on until the cyst is removed.  After discussion of the recommendations and options, she elects to have  the cyst removed.  Will schedule for soon. Will also get a CA-125, though the cyst appears simple. Precautions reviewed for ovarian torsion.   25 minutes spent in face to face discussion with > 50% spent in counseling,management, and coordination of care of her left ovarian cyst.   Prentice Docker, MD 08/23/2019 6:00 PM

## 2019-08-25 ENCOUNTER — Telehealth: Payer: Self-pay | Admitting: Obstetrics and Gynecology

## 2019-08-25 NOTE — Telephone Encounter (Signed)
-----   Message from Will Bonnet, MD sent at 08/23/2019  6:12 PM EST ----- Regarding: Schedule surgery Surgery Booking Request Patient Full Name:  Jessica Zuniga  MRN: [id number]  DOB: [birth year]  Surgeon: Prentice Docker, MD  Requested Surgery Date and Time: TBD (soon) Primary Diagnosis AND Code: Left ovarian cyst (N83.202) Secondary Diagnosis and Code:  Surgical Procedure: operative laparoscopy, left salpingo-oophorectomy, possible right salpingectomy.  L&D Notification: No Admission Status: same day surgery Length of Surgery: 60 minutes Special Case Needs: No H&P: Yes Phone Interview???:  No Interpreter: No Language:  Medical Clearance:  No Special Scheduling Instructions: none Any known health/anesthesia issues, diabetes, sleep apnea, latex allergy, defibrillator/pacemaker?: No Acuity: P2   (P1 highest, P2 delay may cause harm, P3 low, elective gyn, P4 lowest)

## 2019-08-25 NOTE — Telephone Encounter (Signed)
Patient is aware of H&P at [facility] on 2/22 @ 8:10am, Pre-admit testing to be scheduled, COVID testing on 2/23, and OR on 09/08/19. Patient is aware to quarantine after COVID testing. Patient is aware she may receive calls from the [facility]. Patient confirmed [employer]BS and is aware her plan is out-of-network, and she will be responsible for charges. Dr. Marisue Brooklyn cost was discussed, and the patient is aware I do not have access to hospital, anesthesia, lab, etc. charges.

## 2019-09-03 DIAGNOSIS — K754 Autoimmune hepatitis: Principal | ICD-10-CM

## 2019-09-05 ENCOUNTER — Other Ambulatory Visit: Payer: Self-pay

## 2019-09-05 ENCOUNTER — Encounter: Payer: Self-pay | Admitting: Obstetrics and Gynecology

## 2019-09-05 ENCOUNTER — Encounter
Admission: RE | Admit: 2019-09-05 | Discharge: 2019-09-05 | Disposition: A | Discharge status: Home / Self Care | Payer: BLUE CROSS/BLUE SHIELD | Source: Ambulatory Visit | Attending: Obstetrics and Gynecology | Admitting: Obstetrics and Gynecology

## 2019-09-05 ENCOUNTER — Ambulatory Visit (INDEPENDENT_AMBULATORY_CARE_PROVIDER_SITE_OTHER): Payer: BLUE CROSS/BLUE SHIELD | Admitting: Obstetrics and Gynecology

## 2019-09-05 VITALS — BP 146/84 | HR 89 | Ht 66.0 in | Wt 156.0 lb

## 2019-09-05 DIAGNOSIS — N83202 Unspecified ovarian cyst, left side: Secondary | ICD-10-CM

## 2019-09-05 DIAGNOSIS — Z01818 Encounter for other preprocedural examination: Secondary | ICD-10-CM

## 2019-09-05 DIAGNOSIS — R030 Elevated blood-pressure reading, without diagnosis of hypertension: Secondary | ICD-10-CM

## 2019-09-05 DIAGNOSIS — N838 Other noninflammatory disorders of ovary, fallopian tube and broad ligament: Secondary | ICD-10-CM | POA: Diagnosis not present

## 2019-09-05 DIAGNOSIS — K754 Autoimmune hepatitis: Principal | ICD-10-CM

## 2019-09-05 DIAGNOSIS — Z79899 Other long term (current) drug therapy: Principal | ICD-10-CM

## 2019-09-05 HISTORY — DX: Inflammatory liver disease, unspecified: K75.9

## 2019-09-05 NOTE — H&P (View-Only) (Signed)
Preoperative History and Physical  Jessica Zuniga is a 59 y.o. G3P3 here for surgical management of persistent left ovarian cyst.   No significant preoperative concerns.  History of Present Illness: 59 y.o. G3P3 female who presents to discuss management options for an incidentally noted left ovarian cyst on MRI at [facility] in November. She has a history notable for autoimmune hepatitis, which is under good control at this point, per patient report. She undergoes MRI for this every six months at [facility].  In November she had a cyst noted that appeared to be 5.7 cm. She notes no symptoms from this.   She underwent a pelvic ultrasound 2 weeks ago to better characterize this cyst and to assess for resolution of the cyst. In short, the ultrasound shows a simple-appearing left ovarian cyst measuring 5.8 x 4.4 x 5.4 cm.  There is no blood flow on doppler interrogation.  There is no free fluid in the cul-de-sac.  This represents an essentially unchanged ovarian cyst from November.    Proposed surgery: Operative laparoscopy, left salpingo-oophorectomy, right salpingectomy  Past Medical History:  Diagnosis Date  . Chest pain   . HLD (hyperlipidemia)   . Papilloma of breast    right breast   Past Surgical History:  Procedure Laterality Date  . BREAST BIOPSY Right 12/2012   neg stereo/ clip  . BREAST EXCISIONAL BIOPSY Right 1997   neg  . BREAST SURGERY Right 1997   papilloma    OB History  Gravida Para Term Preterm AB Living  3 3       3   SAB TAB Ectopic Multiple Live Births               # Outcome Date GA Lbr Len/2nd Weight Sex Delivery Anes PTL Lv  3 Para           2 Para           1 Para             Obstetric Comments  1st Menstrual Cycle:  13  1st Pregnancy:  29  Patient denies any other pertinent gynecologic issues.   Current Outpatient Medications on File Prior to Visit  Medication Sig Dispense Refill  . mycophenolate (CELLCEPT) 500 MG tablet Take 1,000 mg by mouth 2 (two) times daily.       No current facility-administered medications on file prior to visit.   Allergies  Allergen Reactions  . Azathioprine     Fever    Social History:   reports that she has never smoked. She has never used smokeless tobacco. She reports current alcohol use. She reports that she does not use drugs.  Family History  Problem Relation Age of Onset  . Heart attack Father        heart transplant  . Heart disease Paternal Grandmother   . Heart disease Maternal Grandmother   . Breast cancer Neg Hx     Review of Systems: Noncontributory  PHYSICAL EXAM: Blood pressure (!) 146/84, pulse 89, height 5\' 6"  (1.676 m), weight 156 lb (70.8 kg). CONSTITUTIONAL: Well-developed, well-nourished female in no acute distress.  HENT:  Normocephalic, atraumatic, External right and left ear normal. Oropharynx is clear and moist EYES: Conjunctivae and EOM are normal. Pupils are equal, round, and reactive to light. No scleral icterus.  NECK: Normal range of motion, supple, no masses SKIN: Skin is warm and dry. No rash noted. Not diaphoretic. No erythema. No pallor. [place]: Alert and oriented to person,  place, and time. Normal reflexes, muscle tone coordination. No cranial nerve deficit noted. PSYCHIATRIC: Normal mood and affect. Normal behavior. Normal judgment and thought content. CARDIOVASCULAR: Normal heart rate noted, regular rhythm RESPIRATORY: Effort and breath sounds normal, no problems with respiration noted ABDOMEN: Soft, nontender, nondistended. PELVIC: Deferred MUSCULOSKELETAL: Normal range of motion. No edema and no tenderness. 2+ distal pulses.  Labs: No results found for this or any previous visit (from the past 336 hour(s)).  Imaging Studies: US Transvaginal Non-OB [facility])  Result Date: 08/23/2019 Patient Name: Jessica Zuniga DOB: [birth year] MRN: [id number] ULTRASOUND REPORT Location: [facility] Date of Service: 08/23/2019 Indications:Adnexal Mass Findings: The uterus is anteverted  and measures 5.8 x 4.0 x 3.2 cm. Echo texture is heterogenous with evidence of focal masses. Within the uterus are multiple suspected fibroids measuring: Fibroid 1: 12.9 x 12.9 x 15.7 mm subserosal left Fibroid 2: 12.3 x 10.9 x 16.0 mm subserosal fundal The Endometrium measures 6.0 mm. Right Ovary measures 2.2 x 1.5 x 1.1  cm. It is normal in appearance. Left Ovary measures 5.8 x 6.3 x 4.5 cm. It is not normal in appearance. There is a simple cyst in the left ovary measuring 57.7 x 43.9 x 54.1 mm. No blood flow is seen within the cyst. Survey of the adnexa demonstrates no adnexal masses. There is no free fluid in the cul de sac. Impression: 1. There are two uterine fibroids seen. 2. There is a thick endometrial lining. 3. Normal appearing right ovary. 4. There is a 5.8 cm cyst with anechoic fluid seen in the left ovary. Gweneth Dimitri, RT The ultrasound images and findings were reviewed by me and I agree with the above report. Prentice Docker, MD, [facility], [facility] 08/23/2019 5:09 PM      Assessment:   ICD-10-CM   1. Left ovarian cyst  N83.202 CA 125  2. Ovarian mass  N83.8 CA 125     Plan: Patient will undergo surgical management with the above-proposed surgery.   The risks of surgery were discussed in detail with the patient including but not limited to: bleeding which may require transfusion or reoperation; infection which may require antibiotics; injury to surrounding organs which may involve bowel, bladder, ureters ; need for additional procedures including laparoscopy or laparotomy; thromboembolic phenomenon, surgical site problems and other postoperative/anesthesia complications. Likelihood of success in alleviating the patient's condition was discussed. Routine postoperative instructions will be reviewed with the patient and her family in detail after surgery.  The patient concurred with the proposed plan, giving informed written consent for the surgery.   Preoperative prophylactic antibiotics, as indicated, and SCDs ordered on call to the OR.    Prentice Docker, MD 09/05/2019 8:18 AM

## 2019-09-05 NOTE — Progress Notes (Signed)
Preoperative History and Physical  Jessica Zuniga is a 59 y.o. G3P3 here for surgical management of persistent left ovarian cyst.   No significant preoperative concerns.  History of Present Illness: 59 y.o. G3P3 female who presents to discuss management options for an incidentally noted left ovarian cyst on MRI at [facility] in November. She has a history notable for autoimmune hepatitis, which is under good control at this point, per patient report. She undergoes MRI for this every six months at [facility].  In November she had a cyst noted that appeared to be 5.7 cm. She notes no symptoms from this.   She underwent a pelvic ultrasound 2 weeks ago to better characterize this cyst and to assess for resolution of the cyst. In short, the ultrasound shows a simple-appearing left ovarian cyst measuring 5.8 x 4.4 x 5.4 cm.  There is no blood flow on doppler interrogation.  There is no free fluid in the cul-de-sac.  This represents an essentially unchanged ovarian cyst from November.    Proposed surgery: Operative laparoscopy, left salpingo-oophorectomy, right salpingectomy  Past Medical History:  Diagnosis Date  . Chest pain   . HLD (hyperlipidemia)   . Papilloma of breast    right breast   Past Surgical History:  Procedure Laterality Date  . BREAST BIOPSY Right 12/2012   neg stereo/ clip  . BREAST EXCISIONAL BIOPSY Right 1997   neg  . BREAST SURGERY Right 1997   papilloma    OB History  Gravida Para Term Preterm AB Living  3 3       3   SAB TAB Ectopic Multiple Live Births               # Outcome Date GA Lbr Len/2nd Weight Sex Delivery Anes PTL Lv  3 Para           2 Para           1 Para             Obstetric Comments  1st Menstrual Cycle:  13  1st Pregnancy:  29  Patient denies any other pertinent gynecologic issues.   Current Outpatient Medications on File Prior to Visit  Medication Sig Dispense Refill  . mycophenolate (CELLCEPT) 500 MG tablet Take 1,000 mg by mouth 2 (two) times daily.       No current facility-administered medications on file prior to visit.   Allergies  Allergen Reactions  . Azathioprine     Fever    Social History:   reports that she has never smoked. She has never used smokeless tobacco. She reports current alcohol use. She reports that she does not use drugs.  Family History  Problem Relation Age of Onset  . Heart attack Father        heart transplant  . Heart disease Paternal Grandmother   . Heart disease Maternal Grandmother   . Breast cancer Neg Hx     Review of Systems: Noncontributory  PHYSICAL EXAM: Blood pressure (!) 146/84, pulse 89, height 5\' 6"  (1.676 m), weight 156 lb (70.8 kg). CONSTITUTIONAL: Well-developed, well-nourished female in no acute distress.  HENT:  Normocephalic, atraumatic, External right and left ear normal. Oropharynx is clear and moist EYES: Conjunctivae and EOM are normal. Pupils are equal, round, and reactive to light. No scleral icterus.  NECK: Normal range of motion, supple, no masses SKIN: Skin is warm and dry. No rash noted. Not diaphoretic. No erythema. No pallor. [place]: Alert and oriented to person,  place, and time. Normal reflexes, muscle tone coordination. No cranial nerve deficit noted. PSYCHIATRIC: Normal mood and affect. Normal behavior. Normal judgment and thought content. CARDIOVASCULAR: Normal heart rate noted, regular rhythm RESPIRATORY: Effort and breath sounds normal, no problems with respiration noted ABDOMEN: Soft, nontender, nondistended. PELVIC: Deferred MUSCULOSKELETAL: Normal range of motion. No edema and no tenderness. 2+ distal pulses.  Labs: No results found for this or any previous visit (from the past 336 hour(s)).  Imaging Studies: US Transvaginal Non-OB [facility])  Result Date: 08/23/2019 Patient Name: Jessica Zuniga DOB: 01-22-1961 MRN: [id number] ULTRASOUND REPORT Location: [facility] Date of Service: 08/23/2019 Indications:Adnexal Mass Findings: The uterus is anteverted  and measures 5.8 x 4.0 x 3.2 cm. Echo texture is heterogenous with evidence of focal masses. Within the uterus are multiple suspected fibroids measuring: Fibroid 1: 12.9 x 12.9 x 15.7 mm subserosal left Fibroid 2: 12.3 x 10.9 x 16.0 mm subserosal fundal The Endometrium measures 6.0 mm. Right Ovary measures 2.2 x 1.5 x 1.1  cm. It is normal in appearance. Left Ovary measures 5.8 x 6.3 x 4.5 cm. It is not normal in appearance. There is a simple cyst in the left ovary measuring 57.7 x 43.9 x 54.1 mm. No blood flow is seen within the cyst. Survey of the adnexa demonstrates no adnexal masses. There is no free fluid in the cul de sac. Impression: 1. There are two uterine fibroids seen. 2. There is a thick endometrial lining. 3. Normal appearing right ovary. 4. There is a 5.8 cm cyst with anechoic fluid seen in the left ovary. Gweneth Dimitri, RT The ultrasound images and findings were reviewed by me and I agree with the above report. Prentice Docker, MD, [facility], [place] [facility] 08/23/2019 5:09 PM      Assessment:   ICD-10-CM   1. Left ovarian cyst  N83.202 CA 125  2. Ovarian mass  N83.8 CA 125     Plan: Patient will undergo surgical management with the above-proposed surgery.   The risks of surgery were discussed in detail with the patient including but not limited to: bleeding which may require transfusion or reoperation; infection which may require antibiotics; injury to surrounding organs which may involve bowel, bladder, ureters ; need for additional procedures including laparoscopy or laparotomy; thromboembolic phenomenon, surgical site problems and other postoperative/anesthesia complications. Likelihood of success in alleviating the patient's condition was discussed. Routine postoperative instructions will be reviewed with the patient and her family in detail after surgery.  The patient concurred with the proposed plan, giving informed written consent for the surgery.   Preoperative prophylactic antibiotics, as indicated, and SCDs ordered on call to the OR.    Prentice Docker, MD 09/05/2019 8:18 AM

## 2019-09-05 NOTE — Patient Instructions (Addendum)
INSTRUCTIONS FOR SURGERY     Your surgery is scheduled for:   Thursday, February 25TH     To find out your arrival time for the day of surgery,          please call [phone] between 1 pm and 3 pm on :  Wednesday, February 24TH     When you arrive for surgery, report to the [facility].       Do NOT stop on the first floor to register.    REMEMBER: Instructions that are not followed completely may result in serious medical risk,  up to and including death, or upon the discretion of your surgeon and anesthesiologist,            your surgery may need to be rescheduled.  __X__ 1. Do not eat food after midnight the night before your procedure.                    No gum, candy, lozenger, tic tacs, tums or hard candies.                  ABSOLUTELY NOTHING SOLID IN YOUR MOUTH AFTER MIDNIGHT                    You may drink unlimited clear liquids up to 2 hours before you are scheduled to arrive for surgery.                   Do not drink anything within those 2 hours unless you need to take medicine, then take the                   smallest amount you need.  Clear liquids include:  water, apple juice without pulp,                   any flavor Gatorade, Black coffee, black tea.  Sugar may be added but no dairy/ honey /lemon.                        Broth and jello is not considered a clear liquid.  __x__  2. On the morning of surgery, please brush your teeth with toothpaste and water. You may rinse with                  mouthwash if you wish but DO NOT SWALLOW TOOTHPASTE OR MOUTHWASH  __X___3. NO alcohol for 24 hours before or after surgery.  __x___ 4.  Do NOT smoke or use e-cigarettes for 24 HOURS PRIOR TO SURGERY.                      DO NOT Use any chewable tobacco products for at least 6 hours prior to surgery.  __x___ 5. If you start any new medication after this appointment and prior to surgery, please              Bring it with you on the day of surgery.  ___x__ 6. Notify your doctor if there is any change in your medical condition, such as fever,  infection, vomitting, diarrhea or any open sores.  __x___ 7.  USE the CHG SOAP as instructed, the night before surgery and the day of surgery.                   Once you have washed with this soap, do NOT use any of the following: Powders, perfumes                    or lotions. Please do not wear make up, hairpins, clips or nail polish. You may wear deodorant.                   Men may shave their face and neck.  Women need to shave 48 hours prior to surgery.                   DO NOT wear ANY jewelry on the day of surgery. If there are rings that are too tight to                    remove easily, please address this prior to the surgery day. Piercings need to be removed.                                                                     NO METAL ON YOUR BODY.                    Do NOT bring any valuables.  If you came to Pre-Admit testing then you will not need license,                     insurance card or credit card.  If you will be staying overnight, please either leave your things in                     the car or have your family be responsible for these items.                     [facility] IS NOT RESPONSIBLE FOR BELONGINGS OR VALUABLES.  ___X__ 8. DO NOT wear contact lenses on surgery day.  You may not have dentures,                     Hearing aides, contacts or glasses in the operating room. These items can be                    Placed in the Recovery Room to receive immediately after surgery.  __x___ 9. IF YOU ARE SCHEDULED TO GO HOME ON THE SAME DAY, YOU MUST                   Have someone to drive you home and to stay with you  for the first 24 hours.                    Have an arrangement prior to arriving on surgery day.  ___x__ 10. Take the following medications on the morning of surgery with a sip of water:  1.  CELLCEPT, if it is tolerable on an empty stomach                     2.                                                              _____ 11.  Follow any instructions provided to you by your surgeon.                        Such as enema, clear liquid bowel prep  __X__  12. STOP ASPIRIN AS OF:  Monday, February 22nd                       THIS INCLUDES BC POWDERS / GOODIES POWDER  __x___ 13. STOP Anti-inflammatories as of:  Monday, February 22nd                      This includes IBUPROFEN / MOTRIN / ADVIL / ALEVE/ NAPROXYN                    YOU MAY TAKE TYLENOL ANY TIME PRIOR TO SURGERY.  ______17.  Continue to take the following medications but do not take on the morning of surgery:                          N/A  __x____18.   Wear clean and comfortable clothing to the hospital. Have stretchy pants or loose pants.

## 2019-09-05 NOTE — Unmapped (Signed)
Lab orders entered

## 2019-09-06 ENCOUNTER — Encounter
Admission: RE | Admit: 2019-09-06 | Discharge: 2019-09-06 | Disposition: A | Discharge status: Home / Self Care | Payer: BLUE CROSS/BLUE SHIELD | Source: Ambulatory Visit | Attending: Obstetrics and Gynecology | Admitting: Obstetrics and Gynecology

## 2019-09-06 DIAGNOSIS — Z20822 Contact with and (suspected) exposure to covid-19: Secondary | ICD-10-CM | POA: Diagnosis not present

## 2019-09-06 DIAGNOSIS — Z79899 Other long term (current) drug therapy: Secondary | ICD-10-CM | POA: Diagnosis not present

## 2019-09-06 DIAGNOSIS — K754 Autoimmune hepatitis: Secondary | ICD-10-CM | POA: Diagnosis not present

## 2019-09-06 DIAGNOSIS — Z01810 Encounter for preprocedural cardiovascular examination: Secondary | ICD-10-CM | POA: Insufficient documentation

## 2019-09-06 DIAGNOSIS — Z01812 Encounter for preprocedural laboratory examination: Secondary | ICD-10-CM | POA: Diagnosis not present

## 2019-09-06 LAB — SARS CORONAVIRUS 2 (TAT 6-24 HRS): SARS Coronavirus 2: NEGATIVE

## 2019-09-06 LAB — PROTIME-INR
INR: 1 (ref 0.8–1.2)
Prothrombin Time: 12.6 seconds (ref 11.4–15.2)

## 2019-09-06 LAB — CA 125: Cancer Antigen (CA) 125: 13.8 U/mL (ref 0.0–38.1)

## 2019-09-06 LAB — APTT: aPTT: 25 seconds (ref 24–36)

## 2019-09-06 NOTE — Unmapped (Signed)
[facility] Refill Coordination Note    Specialty Medication(s) to be Shipped:   Transplant: mycophenolate mofetil 500mg     Other medication(s) to be shipped:      Margaret Berger, DOB: [birth year]  Phone: [phone] (home)       All above HIPAA information was verified with patient.     Was a Nurse, learning disability used for this call? No    Completed refill call assessment today to schedule patient's medication shipment from the [facility] [phone]).       Specialty medication(s) and dose(s) confirmed: Regimen is correct and unchanged.   Changes to medications: Margaret Berger reports no changes at this time.  Changes to insurance: No  Questions for the pharmacist: No    Confirmed patient received Welcome Packet with first shipment. The patient will receive a drug information handout for each medication shipped and additional FDA Medication Guides as required.       DISEASE/MEDICATION-SPECIFIC INFORMATION        N/A    SPECIALTY MEDICATION ADHERENCE     Medication Adherence    Patient reported X missed doses in the last month: 0  Specialty Medication: MYCOPHENOLATE 500MG    Patient is on additional specialty medications: No  Informant: patient  Confirmed plan for next specialty medication refill: delivery by pharmacy  Refills needed for supportive medications: yes, ordered or provider notified          Refill Coordination    Has the Patients' Contact Information Changed: No  Is the Shipping Address Different: No           MYCOPHENOLATE 500 mg: 6 days of medicine on hand       SHIPPING     Shipping address confirmed in Epic.     Delivery Scheduled: Yes, Expected medication delivery date: 2/26.  However, Rx request for refills was sent to the provider as there are none remaining.     Medication will be delivered via UPS to the prescription address in Epic WAM.    Jolene Schimke   [facility] Specialty Technician

## 2019-09-07 ENCOUNTER — Telehealth
Admit: 2019-09-07 | Discharge: 2019-09-08 | Payer: PRIVATE HEALTH INSURANCE | Attending: Gastroenterology | Primary: Gastroenterology

## 2019-09-07 DIAGNOSIS — K754 Autoimmune hepatitis: Secondary | ICD-10-CM | POA: Diagnosis not present

## 2019-09-07 LAB — CBC W/ DIFFERENTIAL
BANDED NEUTROPHILS ABSOLUTE COUNT: 0 10*3/uL (ref 0.0–0.1)
BASOPHILS ABSOLUTE COUNT: 0 10*3/uL (ref 0.0–0.2)
BASOPHILS RELATIVE PERCENT: 1 %
EOSINOPHILS ABSOLUTE COUNT: 0.1 10*3/uL (ref 0.0–0.4)
EOSINOPHILS RELATIVE PERCENT: 2 %
HEMATOCRIT: 37.5 % (ref 34.0–46.6)
HEMOGLOBIN: 13 g/dL (ref 11.1–15.9)
IMMATURE GRANULOCYTES: 0 %
LYMPHOCYTES RELATIVE PERCENT: 30 %
MEAN CORPUSCULAR HEMOGLOBIN CONC: 34.7 g/dL (ref 31.5–35.7)
MEAN CORPUSCULAR HEMOGLOBIN: 31.6 pg (ref 26.6–33.0)
MONOCYTES RELATIVE PERCENT: 8 %
NEUTROPHILS ABSOLUTE COUNT: 3.8 10*3/uL (ref 1.4–7.0)
NEUTROPHILS RELATIVE PERCENT: 59 %
PLATELET COUNT: 178 10*3/uL (ref 150–450)
RED BLOOD CELL COUNT: 4.11 x10E6/uL (ref 3.77–5.28)
RED CELL DISTRIBUTION WIDTH: 12.3 % (ref 11.7–15.4)
WHITE BLOOD CELL COUNT: 6.4 10*3/uL (ref 3.4–10.8)

## 2019-09-07 LAB — COMPREHENSIVE METABOLIC PANEL
A/G RATIO: 2.5 — ABNORMAL HIGH (ref 1.2–2.2)
ALKALINE PHOSPHATASE: 59 IU/L (ref 39–117)
ALT (SGPT): 20 IU/L (ref 0–32)
AST (SGOT): 28 IU/L (ref 0–40)
BLOOD UREA NITROGEN: 16 mg/dL (ref 8–27)
BUN / CREAT RATIO: 21 (ref 12–28)
CALCIUM: 9.9 mg/dL (ref 8.7–10.3)
CHLORIDE: 100 mmol/L (ref 96–106)
CO2: 22 mmol/L (ref 20–29)
CREATININE: 0.78 mg/dL (ref 0.57–1.00)
GFR MDRD AF AMER: 96 mL/min/{1.73_m2}
GFR MDRD NON AF AMER: 83 mL/min/{1.73_m2}
GLOBULIN, TOTAL: 2 g/dL (ref 1.5–4.5)
GLUCOSE: 100 mg/dL — ABNORMAL HIGH (ref 65–99)
POTASSIUM: 4.6 mmol/L (ref 3.5–5.2)
SODIUM: 139 mmol/L (ref 134–144)
TOTAL PROTEIN: 6.9 g/dL (ref 6.0–8.5)

## 2019-09-07 LAB — PROTIME-INR: INR: 1 (ref 0.9–1.2)

## 2019-09-07 LAB — PROTHROMBIN TIME: Coagulation tissue factor induced:Time:Pt:PPP:Qn:Coag: 10.5

## 2019-09-07 LAB — NEUTROPHILS RELATIVE PERCENT: Neutrophils/100 leukocytes:NFr:Pt:Bld:Qn:Automated count: 59

## 2019-09-07 LAB — AFP-TUMOR MARKER: Alpha-1-Fetoprotein.tumor marker:MCnc:Pt:Ser/Plas:Qn:: 4.7

## 2019-09-07 LAB — ALKALINE PHOSPHATASE: Alkaline phosphatase:CCnc:Pt:Ser/Plas:Qn:: 59

## 2019-09-07 NOTE — Unmapped (Signed)
[facility]    [name], M.D.  Professor of Medicine  Director, [facility]  [place] [facility] [place] [facility] [place]    [phone]    [name]    Referring MD: Midge Minium, MD    Phone visit 06/08/19     Chief complaint: Office follow-up for elevation of liver enzymes. Hospitalization (08/2016) for acute hepatitis of uncertain etiology, presumed autoimmune hepatitis treated with prednisone and mycophenylate. DRUG FEVER ASSOCIATED WITH AZATHIOPRINE. STEROIDS STOPPED 07/2017. CONTINUES ON CELLCEPT 1 GRAM BID FOR IMMUNOSUPPRESSION    Interval history: Patient continues to feel great. She denies fever, cough, sob, N/V, abdominal pain, chest pain, or skin rash.  She has been maintained on mycophenylate 1gram bid. No new health related concerns. She has received COVID vaccine in [place]. Last MRI showed an incidental adnexal cyst which is scheduled for surgical excision on 09/08/19.      Present illness at presentation:  Patient is a 59 y.o. Caucasian female who was hospitalized at [facility] between approximately February 12 -  August 29, 2016 with acute hepatitis. The patient had about 10 weeks of abnormal liver tests documented by her primary physician and her primary gastroenterologist. This was preceded by a short flu-like illness characterized by fatigue, muscle aches, and low-grade fever. All of the symptoms have completely remained resolved since her  Visit 09/10/2016. However she was left with elevated liver tests and had an extensive workup as an outpatient and subsequently as an inpatient at [facility] to further define the etiology of her liver disease.  Additional details of her history and prior outpatient evaluation as well as her inpatient evaluation are available from our initial consultation note and follow-up notes while she was hospitalized.       After review of the liver biopsy and all serologic data thus far which has been unrevealing, it was felt that a therapeutic trial of prednisone for presumed seronegative autoimmune hepatitis was warranted. Prednisone  40 mg of daily started on 08/29/16. She had a good biochemical response with marked improvement in ALT, INR, including a slow but steady decrease in Total/direct bilirubin.     Patient had an extensive serological evaluation which was not revealing:  1. Antinuclear antibody and anti-smooth muscle antibody were negative. Immunoglobulin G was not elevated (1061)  2. Viral serologies including acute hepatitis A, hepatitis B, hepatitis C, HCV RNA, EBV PCR, CMV PCR, HHV-6 were all negative.  3. Liver ultrasound demonstrated patent vasculature with a nodular-appearing heterogeneous liver.  4. Percutaneous liver biopsy 08/2016 at [facility]:    A: Liver, core biopsy   - Severe active hepatitis with confluent areas of periportal hepatocellular necrosis (approximately 10-20% of sampled parenchyma) (see comment)   - Trichrome stain demonstrates at least periportal fibrosis and is suspicious for bridging fibrosis     Potential etiologies to consider include, but are not limited to, infection, drug/toxin-induced liver injury, seronegative autoimmune hepatitis, and metabolic disorders. The trichrome stain findings suggest that this process is at least subacute in duration.      Liver biopsy slides were personally reviewed by Dr. Sharon Mt  with the pathologist.  No viral inclusions were noted and only few plasma cells were visualized in the liver biopsy which was an adequate specimen. No cytopathic effect. EBV immunostains were negative. Fe in hepatocytes only grade 1/4    5.  Evaluation for Zoster: Dr. Foy Guadalajara received phone call from patient's primary GI MD that patient's Zoster IgM from 09/05/2016 was positive  with titer 1.17 approximately one week after starting prednisone. Review of her record though revealed this test to have been negative on 08/29/2016.  Dr. Sharon Mt discussed these findings with Dr. Lytle Butte and the decision was made to proceed with patient starting Valtrex 500 mg bid.  She was seen by Dr. 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   Recommendations from ID: Continue valacyclovir 500 mg bid.  Advised readdressing stopping Valtrex if her repeat IgM is negative or once she is on <15 mg prednisone daily.  Valtrex was discontinued last week.  Bactrim prophylaxis while prednisone >15 mg q 24. 1 DS tablet M/W/F. The following vaccines were recommended for her to obtain under the care of her PCP: Twinrix, PCV13, and PPSV23. She should have these performed by her PCP when prednisone dose is  <20 mg daily.     Immunosuppressive history:  Patient was started on azathioprine for steroid sparing benefits in early April.  After 2 weeks patient developed fevers. ID workup negative during hospitalization.  Azathioprine was held during that hospitalization and fevers resolved. When she restarted azathioprine as outpatient, immediately had high fevers and shaking chills.  This rechallenge was highly suggestive of drug fever from azathioprine.    On 5/12 patient was started on CellCept 500mg  bid and continued on prednisone 15 mg daily.  On 12/03/16, liver enzymes remained elevated. Cellcept was increased to 1000mg  BID on 12/04/16. Prednisone tapered to off as of 07/2017.     10 sys ROS otherwise negative.     Past medical history:  Patient has generally been healthy until the current hepatitis began in December 2017.  1. No history of diabetes, coronary artery disease, hypertension, or lung disease.  2. No history of other autoimmune systemic diseases  3. FIBROSCAN 11/05/16: 17.7 kps c/w stage F4 (Patient ate breakfast 3 hours prior).  4. MRI 10/2017: HEPATOBILIARY: Nodular liver contour compatible with history of cirrhosis and similar to prior. Again seen is a geographic region of T1 hypointensity and corresponding slight T2 hyperintensity in the posterior right hepatic lobe which is overall similar in extent when compared to prior imaging. This region demonstrates progressive postcontrast enhancement as before. Overall similar degree of capsular retraction most pronounced along the right anterior margin of the liver (13:29). No discrete early arterially enhancing or washout liver lesion identified. No biliary ductal dilatation. Similar appearance and position of the borderline hydropic gallbladder.  PANCREAS: Pancreas divisum. No ductal dilatation.    5. MRI 05/2019: Morphologic features consistent with history of cirrhosis. No sequela of portal hypertension.No MR evidence of HCC.  Similar appearing probable confluent fibrosis in the right hepatic lobe. Incidental right adnexal cyst.    Allergies   Allergen Reactions   ??? Azathioprine Other (See Comments)     Fever  Other reaction(s): Other (See Comments)  Fever       Current Outpatient Medications   Medication Sig Dispense Refill   ??? ibuprofen (MOTRIN) 600 MG tablet Take 600 mg by mouth every six (6) hours as needed.     ??? ketoconazole (NIZORAL) 2 % cream Apply 1 application topically Two (2) times a day. To affected areas as directed 60 g 1   ??? mycophenolate (CELLCEPT) 500 mg tablet Take 2 tablets (1,000 mg total) by mouth 2 (two) times a day with meals. 120 tablet 5   ??? mycophenolate (CELLCEPT) 500 mg tablet Take 2 tablets (1,000 mg total) by mouth  Two (2) times a day. (Patient not taking: Reported on 03/15/2019) 120 tablet 5     No current facility-administered medications for this visit.      Social history: The patient is married. She works as a Materials engineer. They have traveled extensively.   She does not drink any alcohol. She does not smoke cigarettes.    Family history: Negative for liver disease or liver cancer. Negative for autoimmune systemic diseases.    Physical Examination: No exam performed as this was video visit.      Laboratory Studies: Pending    Impression:  1. Seronegative autoimmune hepatitis based on response to immunosuppressive medication. The patient presented with severe hepatitis as evidenced by hyperbilirubinemia, elevation of her INR, decreased albumin, and results from her liver biopsy. Patient was started on a therapeutic trial of prednisone for the reasons discussed above. After only 4 days of immunosuppression, her liver enzymes (ALT)  that had been regularly elevated above 1000 decreased substantially.  This was highly suggestive of a positive effect of the prednisone and was c/w  presumed ANA-negative/SMA-negative autoimmune hepatitis.Labs showed continued normalization of bilirubin and INR, although she remained with mildly elevated ALT in the 40-45 range. CellCept was added on 5/12 and increased to 1000mg  BID on 12/04/16.Steroids tapered to off as of 07/2017.    We will maintain current level of CellCept.  ALT normal.  Patient feels completely well and labs stable.     2. Cirrhosis, well compensated:  FIBROSCAN suggestive of cirrhosis and MRI suggestive of nodular contour with focal fibrosis.  Liver biopsy during acute episode did demonstrate possible bridging fibrosis. Inflammation now resolved.Defer repeat liver biopsy but may reconsider after prolonged remission to reevaluate, as will not change management at this point.     3. HCC surveillance: Findings of irrhosis as described above. 05/2019-MRI with evidence of cirrhosis. Unchanged area of focal fibrosis.  No evidence of enhancing mass. Unchanged from prior. Repeat in 5/21 .      4. Drug fevers from Azathioprine: resolved, no recurrent fever.    5. Mild thrombocytopenia: Resolved. Normal size spleen.     4. Twinrix x 3 completed.     5. . Vaccinations: Completed Shingrix. She will get pneumovax at PCP.     6. Lymphopenia: Resolved.    7. Incidental adnexal cyst seen on MRI: Patient scheduled for surgical excision 09/08/19.    8. Vaccinated for COVID.    RTC 3 months. MRI and labs ordered for same time.     [name], M.D. Professor of Medicine  Director, [facility]  [place] [facility] [place] [facility] [place]    [phone]    I spent 10 minutes on the real-time audio and video with the patient on the date of service. I spent an additional 10 minutes on pre- and post-visit activities.     The patient was physically located in [place] or a state in which I am permitted to provide care. The patient and/or parent/guardian understood that s/he may incur co-pays and cost sharing, and agreed to the telemedicine visit. The visit was reasonable and appropriate under the circumstances given the patient's presentation at the time.    The patient and/or parent/guardian has been advised of the potential risks and limitations of this mode of treatment (including, but not limited to, the absence of in-person examination) and has agreed to be treated using telemedicine. The patient's/patient's family's questions regarding telemedicine have been answered.     If the visit  was completed in an ambulatory setting, the patient and/or parent/guardian has also been advised to contact their provider???s office for worsening conditions, and seek emergency medical treatment and/or call 911 if the patient deems either necessary.

## 2019-09-07 NOTE — Unmapped (Addendum)
Doing great on CellCept for autoimmune hepatitis. Labs from yesterday looked good. Return to office in 3 months and MRI in 5/21 (ordered, labs can be done around same time). Hope the surgery tomorrow goes smoothly.

## 2019-09-08 ENCOUNTER — Ambulatory Visit: Payer: BLUE CROSS/BLUE SHIELD | Admitting: Anesthesiology

## 2019-09-08 ENCOUNTER — Encounter: Payer: Self-pay | Admitting: Obstetrics and Gynecology

## 2019-09-08 ENCOUNTER — Other Ambulatory Visit: Payer: Self-pay

## 2019-09-08 ENCOUNTER — Encounter
Admission: RE | Disposition: A | Discharge status: Home / Self Care | Payer: Self-pay | Source: Home / Self Care | Attending: Obstetrics and Gynecology

## 2019-09-08 ENCOUNTER — Ambulatory Visit
Admission: RE | Admit: 2019-09-08 | Discharge: 2019-09-08 | Disposition: A | Discharge status: Home / Self Care | Payer: BLUE CROSS/BLUE SHIELD | Attending: Obstetrics and Gynecology | Admitting: Obstetrics and Gynecology

## 2019-09-08 DIAGNOSIS — K754 Autoimmune hepatitis: Secondary | ICD-10-CM | POA: Diagnosis not present

## 2019-09-08 DIAGNOSIS — Z803 Family history of malignant neoplasm of breast: Secondary | ICD-10-CM | POA: Diagnosis not present

## 2019-09-08 DIAGNOSIS — D271 Benign neoplasm of left ovary: Secondary | ICD-10-CM | POA: Insufficient documentation

## 2019-09-08 DIAGNOSIS — N838 Other noninflammatory disorders of ovary, fallopian tube and broad ligament: Secondary | ICD-10-CM | POA: Diagnosis not present

## 2019-09-08 DIAGNOSIS — N83202 Unspecified ovarian cyst, left side: Secondary | ICD-10-CM | POA: Diagnosis not present

## 2019-09-08 DIAGNOSIS — E785 Hyperlipidemia, unspecified: Secondary | ICD-10-CM | POA: Diagnosis not present

## 2019-09-08 DIAGNOSIS — Z79899 Other long term (current) drug therapy: Secondary | ICD-10-CM | POA: Insufficient documentation

## 2019-09-08 DIAGNOSIS — Z888 Allergy status to other drugs, medicaments and biological substances status: Secondary | ICD-10-CM | POA: Diagnosis not present

## 2019-09-08 DIAGNOSIS — Z8249 Family history of ischemic heart disease and other diseases of the circulatory system: Secondary | ICD-10-CM | POA: Diagnosis not present

## 2019-09-08 DIAGNOSIS — N83292 Other ovarian cyst, left side: Secondary | ICD-10-CM | POA: Diagnosis not present

## 2019-09-08 HISTORY — PX: LAPAROSCOPIC BILATERAL SALPINGO OOPHERECTOMY: SHX5890

## 2019-09-08 HISTORY — PX: LAPAROSCOPIC UNILATERAL SALPINGECTOMY: SHX5934

## 2019-09-08 LAB — ABO/RH: ABO/RH(D): A POS

## 2019-09-08 LAB — TYPE AND SCREEN
ABO/RH(D): A POS
Antibody Screen: NEGATIVE

## 2019-09-08 SURGERY — SALPINGO-OOPHORECTOMY, BILATERAL, LAPAROSCOPIC
Anesthesia: General | Laterality: Right

## 2019-09-08 MED ORDER — PHENYLEPHRINE HCL (PRESSORS) 10 MG/ML IV SOLN
INTRAVENOUS | Status: DC | PRN
  Administered 2019-09-08: 100 ug via INTRAVENOUS

## 2019-09-08 MED ORDER — PROPOFOL 10 MG/ML IV BOLUS
INTRAVENOUS | Status: DC | PRN
  Administered 2019-09-08: 20 mg via INTRAVENOUS
  Administered 2019-09-08: 130 mg via INTRAVENOUS

## 2019-09-08 MED ORDER — IBUPROFEN 600 MG PO TABS: 600.0000 mg | ORAL_TABLET | Freq: Four times a day (QID) | ORAL | 0 refills | Status: DC | PRN

## 2019-09-08 MED ORDER — ACETAMINOPHEN 10 MG/ML IV SOLN: INTRAVENOUS | Status: DC | PRN

## 2019-09-08 MED ORDER — ACETAMINOPHEN 10 MG/ML IV SOLN
INTRAVENOUS | Status: AC
  Filled 2019-09-08: qty 100

## 2019-09-08 MED ORDER — ONDANSETRON HCL 4 MG/2ML IJ SOLN: 4.0000 mg | Freq: Once | INTRAMUSCULAR | Status: DC | PRN

## 2019-09-08 MED ORDER — ONDANSETRON HCL 4 MG/2ML IJ SOLN
INTRAMUSCULAR | Status: DC | PRN
  Administered 2019-09-08: 4 mg via INTRAVENOUS

## 2019-09-08 MED ORDER — DEXAMETHASONE SODIUM PHOSPHATE 10 MG/ML IJ SOLN
INTRAMUSCULAR | Status: AC
  Filled 2019-09-08: qty 1

## 2019-09-08 MED ORDER — EPHEDRINE SULFATE 50 MG/ML IJ SOLN
INTRAMUSCULAR | Status: DC | PRN
  Administered 2019-09-08: 5 mg via INTRAVENOUS

## 2019-09-08 MED ORDER — PROPOFOL 10 MG/ML IV BOLUS
INTRAVENOUS | Status: AC
  Filled 2019-09-08: qty 20

## 2019-09-08 MED ORDER — SUCCINYLCHOLINE CHLORIDE 20 MG/ML IJ SOLN
INTRAMUSCULAR | Status: AC
  Filled 2019-09-08: qty 1

## 2019-09-08 MED ORDER — FENTANYL CITRATE (PF) 100 MCG/2ML IJ SOLN
INTRAMUSCULAR | Status: DC | PRN
  Administered 2019-09-08: 50 ug via INTRAVENOUS

## 2019-09-08 MED ORDER — FAMOTIDINE 20 MG PO TABS
ORAL_TABLET | ORAL | Status: AC
  Administered 2019-09-08: 20 mg via ORAL
  Filled 2019-09-08: qty 1

## 2019-09-08 MED ORDER — HYDROCODONE-ACETAMINOPHEN 5-325 MG PO TABS: 1.0000 | ORAL_TABLET | Freq: Four times a day (QID) | ORAL | 0 refills | Status: AC | PRN

## 2019-09-08 MED ORDER — KETOROLAC TROMETHAMINE 30 MG/ML IJ SOLN
INTRAMUSCULAR | Status: DC | PRN
  Administered 2019-09-08: 30 mg via INTRAVENOUS

## 2019-09-08 MED ORDER — SILVER NITRATE-POT NITRATE 75-25 % EX MISC
CUTANEOUS | Status: DC | PRN
  Administered 2019-09-08: 2

## 2019-09-08 MED ORDER — DEXAMETHASONE SODIUM PHOSPHATE 10 MG/ML IJ SOLN
INTRAMUSCULAR | Status: DC | PRN
  Administered 2019-09-08: 10 mg via INTRAVENOUS

## 2019-09-08 MED ORDER — FAMOTIDINE 20 MG PO TABS: 20.0000 mg | ORAL_TABLET | Freq: Once | ORAL | Status: AC

## 2019-09-08 MED ORDER — HYDROMORPHONE HCL 1 MG/ML IJ SOLN
INTRAMUSCULAR | Status: AC
  Filled 2019-09-08: qty 1

## 2019-09-08 MED ORDER — BUPIVACAINE HCL 0.5 % IJ SOLN
INTRAMUSCULAR | Status: DC | PRN
  Administered 2019-09-08: 10 mL

## 2019-09-08 MED ORDER — MIDAZOLAM HCL 2 MG/2ML IJ SOLN
INTRAMUSCULAR | Status: AC
  Filled 2019-09-08: qty 2

## 2019-09-08 MED ORDER — SILVER NITRATE-POT NITRATE 75-25 % EX MISC
CUTANEOUS | Status: AC
  Filled 2019-09-08: qty 10

## 2019-09-08 MED ORDER — LIDOCAINE HCL (PF) 2 % IJ SOLN
INTRAMUSCULAR | Status: AC
  Filled 2019-09-08: qty 10

## 2019-09-08 MED ORDER — BUPIVACAINE HCL (PF) 0.5 % IJ SOLN
INTRAMUSCULAR | Status: AC
  Filled 2019-09-08: qty 30

## 2019-09-08 MED ORDER — ROCURONIUM BROMIDE 50 MG/5ML IV SOLN
INTRAVENOUS | Status: AC
  Filled 2019-09-08: qty 3

## 2019-09-08 MED ORDER — PROPOFOL 10 MG/ML IV BOLUS
INTRAVENOUS | Status: AC
  Filled 2019-09-08: qty 40

## 2019-09-08 MED ORDER — MIDAZOLAM HCL 2 MG/2ML IJ SOLN
INTRAMUSCULAR | Status: DC | PRN
  Administered 2019-09-08: 2 mg via INTRAVENOUS

## 2019-09-08 MED ORDER — SUGAMMADEX SODIUM 500 MG/5ML IV SOLN
INTRAVENOUS | Status: AC
  Filled 2019-09-08: qty 5

## 2019-09-08 MED ORDER — LACTATED RINGERS IV SOLN: INTRAVENOUS | Status: DC

## 2019-09-08 MED ORDER — HYDROCORTISONE NA SUCCINATE PF 100 MG IJ SOLR
INTRAMUSCULAR | Status: DC | PRN
  Administered 2019-09-08: 100 mg via INTRAVENOUS

## 2019-09-08 MED ORDER — LIDOCAINE HCL (CARDIAC) PF 100 MG/5ML IV SOSY
PREFILLED_SYRINGE | INTRAVENOUS | Status: DC | PRN
  Administered 2019-09-08: 100 mg via INTRAVENOUS

## 2019-09-08 MED ORDER — SUGAMMADEX SODIUM 500 MG/5ML IV SOLN
INTRAVENOUS | Status: DC | PRN
  Administered 2019-09-08: 280 mg via INTRAVENOUS

## 2019-09-08 MED ORDER — HYDROMORPHONE HCL 1 MG/ML IJ SOLN
INTRAMUSCULAR | Status: DC | PRN
  Administered 2019-09-08 (×2): .5 mg via INTRAVENOUS

## 2019-09-08 MED ORDER — ROCURONIUM BROMIDE 100 MG/10ML IV SOLN
INTRAVENOUS | Status: DC | PRN
  Administered 2019-09-08: 10 mg via INTRAVENOUS
  Administered 2019-09-08: 40 mg via INTRAVENOUS
  Administered 2019-09-08: 10 mg via INTRAVENOUS

## 2019-09-08 MED ORDER — ONDANSETRON HCL 4 MG/2ML IJ SOLN
INTRAMUSCULAR | Status: AC
  Filled 2019-09-08: qty 4

## 2019-09-08 MED ORDER — HYDROCORTISONE NA SUCCINATE PF 100 MG IJ SOLR
INTRAMUSCULAR | Status: AC
  Filled 2019-09-08: qty 2

## 2019-09-08 MED ORDER — FENTANYL CITRATE (PF) 100 MCG/2ML IJ SOLN: 25.0000 ug | INTRAMUSCULAR | Status: DC | PRN

## 2019-09-08 MED ORDER — GLYCOPYRROLATE 0.2 MG/ML IJ SOLN
INTRAMUSCULAR | Status: AC
  Filled 2019-09-08: qty 1

## 2019-09-08 MED ORDER — FENTANYL CITRATE (PF) 100 MCG/2ML IJ SOLN
INTRAMUSCULAR | Status: AC
  Filled 2019-09-08: qty 2

## 2019-09-08 MED ORDER — SODIUM CHLORIDE 0.9 % IV SOLN
INTRAVENOUS | Status: DC | PRN
  Administered 2019-09-08: 15 ug/min via INTRAVENOUS

## 2019-09-08 MED ORDER — GLYCOPYRROLATE 0.2 MG/ML IJ SOLN
INTRAMUSCULAR | Status: DC | PRN
  Administered 2019-09-08: .2 mg via INTRAVENOUS

## 2019-09-08 SURGICAL SUPPLY — 55 items
ADH SKN CLS APL DERMABOND .7 (GAUZE/BANDAGES/DRESSINGS) ×1
ANCHOR TIS RET SYS 235ML (MISCELLANEOUS) ×1 IMPLANT
BAG URINE DRAIN 2000ML AR STRL (UROLOGICAL SUPPLIES) ×3 IMPLANT
BLADE SURG SZ11 CARB STEEL (BLADE) ×3 IMPLANT
CANISTER SUCT 1200ML W/VALVE (MISCELLANEOUS) ×4 IMPLANT
CATH FOLEY 2WAY  5CC 16FR (CATHETERS) ×1
CATH FOLEY 2WAY 5CC 16FR (CATHETERS) ×1
CATH URTH 16FR FL 2W BLN LF (CATHETERS) ×2 IMPLANT
CHLORAPREP W/TINT 26 (MISCELLANEOUS) ×3 IMPLANT
CNTNR SPEC 2.5X3XGRAD LEK (MISCELLANEOUS) ×4
CONT SPEC 4OZ STER OR WHT (MISCELLANEOUS) ×2
CONT SPEC 4OZ STRL OR WHT (MISCELLANEOUS) ×4
CONTAINER SPEC 2.5X3XGRAD LEK (MISCELLANEOUS) IMPLANT
COVER WAND RF STERILE (DRAPES) ×3 IMPLANT
DERMABOND ADVANCED (GAUZE/BANDAGES/DRESSINGS) ×1
DERMABOND ADVANCED .7 DNX12 (GAUZE/BANDAGES/DRESSINGS) ×2 IMPLANT
DRAPE 3/4 80X56 (DRAPES) ×3 IMPLANT
DRAPE LEGGINS SURG 28X43 STRL (DRAPES) ×3 IMPLANT
DRAPE UNDER BUTTOCK W/FLU (DRAPES) ×3 IMPLANT
ELECT REM PT RETURN 9FT ADLT (ELECTROSURGICAL) ×3
ELECTRODE REM PT RTRN 9FT ADLT (ELECTROSURGICAL) ×2 IMPLANT
GLOVE BIO SURGEON STRL SZ7 (GLOVE) ×6 IMPLANT
GLOVE BIOGEL PI IND STRL 7.5 (GLOVE) ×2 IMPLANT
GLOVE BIOGEL PI INDICATOR 7.5 (GLOVE) ×1
GOWN STRL REUS W/ TWL LRG LVL3 (GOWN DISPOSABLE) ×4 IMPLANT
GOWN STRL REUS W/TWL LRG LVL3 (GOWN DISPOSABLE) ×4
GRASPER SUT TROCAR 14GX15 (MISCELLANEOUS) ×1 IMPLANT
IRRIGATION STRYKERFLOW (MISCELLANEOUS) IMPLANT
IRRIGATOR STRYKERFLOW (MISCELLANEOUS) ×3
IV LACTATED RINGERS 1000ML (IV SOLUTION) ×1 IMPLANT
KIT PINK PAD W/HEAD ARE REST (MISCELLANEOUS) ×3
KIT PINK PAD W/HEAD ARM REST (MISCELLANEOUS) ×2 IMPLANT
KIT TURNOVER CYSTO (KITS) ×3 IMPLANT
LABEL OR SOLS (LABEL) ×3 IMPLANT
LIGASURE VESSEL 5MM BLUNT TIP (ELECTROSURGICAL) ×1 IMPLANT
MANIPULATOR UTERINE 4.5 ZUMI (MISCELLANEOUS) IMPLANT
NEEDLE HYPO 22GX1.5 SAFETY (NEEDLE) ×3 IMPLANT
NS IRRIG 500ML POUR BTL (IV SOLUTION) ×3 IMPLANT
PACK LAP CHOLECYSTECTOMY (MISCELLANEOUS) ×3 IMPLANT
PAD OB MATERNITY 4.3X12.25 (PERSONAL CARE ITEMS) ×3 IMPLANT
PAD PREP 24X41 OB/GYN DISP (PERSONAL CARE ITEMS) ×3 IMPLANT
SCISSORS METZENBAUM CVD 33 (INSTRUMENTS) IMPLANT
SET TUBE SMOKE EVAC HIGH FLOW (TUBING) ×3 IMPLANT
SHEARS HARMONIC ACE PLUS 36CM (ENDOMECHANICALS) IMPLANT
SLEEVE ENDOPATH XCEL 5M (ENDOMECHANICALS) ×4 IMPLANT
SOL PREP PVP 2OZ (MISCELLANEOUS) ×3
SOLUTION PREP PVP 2OZ (MISCELLANEOUS) ×2 IMPLANT
SURGILUBE 2OZ TUBE FLIPTOP (MISCELLANEOUS) ×3 IMPLANT
SUT MNCRL 4-0 (SUTURE) ×3
SUT MNCRL 4-0 27XMFL (SUTURE) ×2
SUT VIC AB 2-0 UR6 27 (SUTURE) ×2 IMPLANT
SUTURE MNCRL 4-0 27XMF (SUTURE) IMPLANT
TROCAR ENDO BLADELESS 11MM (ENDOMECHANICALS) ×1 IMPLANT
TROCAR XCEL NON-BLD 5MMX100MML (ENDOMECHANICALS) ×3 IMPLANT
TROCAR XCEL UNIV SLVE 11M 100M (ENDOMECHANICALS) IMPLANT

## 2019-09-08 NOTE — Anesthesia Preprocedure Evaluation (Signed)
Anesthesia Evaluation  Patient identified by MRN, date of birth, ID band Patient awake    Reviewed: Allergy & Precautions, NPO status , Patient's Chart, lab work & pertinent test results  Airway Mallampati: III       Dental no notable dental hx.    Pulmonary neg pulmonary ROS,    Pulmonary exam normal        Cardiovascular negative cardio ROS Normal cardiovascular exam     Neuro/Psych negative neurological ROS     GI/Hepatic (+) Hepatitis -, Autoimmune  Endo/Other  negative endocrine ROS  Renal/GU negative Renal ROS  Female GU complaint     Musculoskeletal negative musculoskeletal ROS (+)   Abdominal Normal abdominal exam  (+)   Peds negative pediatric ROS (+)  Hematology negative hematology ROS (+)   Anesthesia Other Findings Past Medical History: No date: Chest pain No date: Hepatitis     Comment:  autoimmune hepatitis, under control with cellcept and               steroids No date: HLD (hyperlipidemia) 08/2019: Ovarian cyst     Comment:  left No date: Papilloma of breast     Comment:  right breast  Reproductive/Obstetrics                             Anesthesia Physical Anesthesia Plan  ASA: II  Anesthesia Plan: General   Post-op Pain Management:    Induction: Intravenous  PONV Risk Score and Plan:   Airway Management Planned: Oral ETT  Additional Equipment:   Intra-op Plan:   Post-operative Plan: Extubation in OR  Informed Consent: I have reviewed the patients History and Physical, chart, labs and discussed the procedure including the risks, benefits and alternatives for the proposed anesthesia with the patient or authorized representative who has indicated his/her understanding and acceptance.     Dental advisory given  Plan Discussed with: CRNA and Surgeon  Anesthesia Plan Comments:         Anesthesia Quick Evaluation

## 2019-09-08 NOTE — Op Note (Signed)
Operative Note    Pre-Operative Diagnosis: Persistent left ovarian cyst [N83.202]  Post-Operative Diagnosis: Persistent left ovarian cyst [N83.202]  Procedures:  1. Operative laparoscopy 2. Left salpingo-oophorectomy 3. Left salpingectomy  Primary Surgeon: Prentice Docker, MD   Assistant Surgeon: Adrian Prows, MD - No other capable assistant available, in surgery requiring high level assistant.  EBL: 5 mL   IVF: 1,000 mL   Urine output: 150 mL clear urine at end of case  Specimens:  1. left fallopian tube with ovary and ovarian cyst, right fallopian tube 2. Pelvic washings  Drains: none  Complications: None   Disposition: PACU   Condition: Stable   Findings:  1. left ovarian simple-appearing cyst with clear fluid (ruptured inside collection bag external to the body) 2. normal appearing left fallopian tube 3. normal appearing right fallopian tube and ovary (mostly fatty-appearing) 4. normal-appearing appendix 5. Lobulated-appearing liver  Procedure Summary:  The patient was taken to the operating room where general anesthesia was administered and found to be adequate. She was placed in the dorsal supine lithotomy position in [place] stirrups and prepped and draped in the usual, sterile fashion. After a timeout was called an indwelling catheter was placed in her bladder. A sterile speculum was placed in the vagina and a single-tooth tenaculum was used to grasp the anterior lip of the cervix. An acorn uterine manipulator was affixed to the cervix in accordance with the manufacturer's recommendations. The speculum was removed from the vagina.  Attention was turned to the abdomen where after injection of local anesthetic, a 5 mm infraumbilical incision was made with the scalpel. Entry into the abdomen was obtained via Optiview trocar technique (a blunt entry technique with camera visualization through the obturator upon entry). Verification of entry into the abdomen was  obtained using opening pressures. The abdomen was insufflated with CO2. The camera was introduced through the trocar with verification of atraumatic entry.  A 5 mm left lower quadrant trocar was placed under direct, intra-abdominal camera visualization without difficulty.  Similarly, a right lower quadrant 5 mm trocar was placed under direct, intra-abdominal camera visualization without difficulty.  A survey of the pelvic structures was undertaken at this point with the above-noted findings.  Pelvic washings were taken.  The left ureter was visualized and found to be well away from the left infundibulopelvic ligament.  The 5 mm LigaSure device was used to cauterize and transect the left infundibulopelvic ligament.  The mesosalpinx was cauterized and transected in a lateral-to-medial fashion and the left fallopian tube was removed at the cornual region.  The specimen was placed in the posterior cul-de-sac.  Attention was turned to the right adnexa.  The right fallopian tube was removed by cauterizing and transecting the mesosalpinx in a lateral-to-medial fashion, sparing the infundibulopelvic ligament and right ovary.  The vascular pedicles were visualized and found to be hemostatic.  The left ureter was again visualized and found to be well away from the operative area of interest.  The 5 mm umbilical port site was extended to an 11 mm trocar size.  The 11 mm trocar was placed.  The Endo pouch was placed through the 11 mm trocar and the left fallopian tube, ovary, and cyst were placed in the bag.  The right fallopian tube was also placed in the bag.  The bag was brought to the incision site with all specimens intact.  The specimens were removed external to the skin incision and the cyst was ruptured and the clear liquid contents were  suctioned at the time of rupture so that none were spilled on the skin or surrounding tissue.  After removal of the specimens from the bag the 11 mm trocar was replaced.  Inspection  of the rest of the abdomen was undertaken with the above-noted findings.  The vascular pedicles remained hemostatic.  The 11 mm trocar was removed and two 0-Vicryl stitches were thrown to reapproximate the fascia at the umbilical port site.  This was done using the Carter-Thomason device.  CO2 was expelled from the abdomen with the help of 5 deep breaths by anesthesia.  The 5 mm trochars were carefully removed.  The subcutaneous tissue at the umbilicus was reapproximated using 4-0 Monocryl to reduce tension on the skin closure.  This was done at the remaining 2 port sites, as well.  The skin at the infraumbilical incision was closed using 4-0 Monocryl in a subcuticular fashion.  All incisions were then reapproximated using surgical skin glue.  Attention was turned to the vaginal area.  The acorn uterine manipulator was removed.  Silver nitrate was used at the single-tooth tenaculum entry sites on the cervix to assure hemostasis.  The Foley catheter was removed from the bladder.  The vagina was swept with a single digit to verify that there were no remaining instruments or sponges.  The assistant aided with placement of the right lower quadrant trocar, retraction of tissue in order to liberate the left adnexal structures.  The assistant also performed the cauterization and transection of the right fallopian tube while I performed retraction of tissue.  She also assisted with removal of the specimen from the specimen bag at the umbilical port site.  The patient tolerated the procedure well.  Sponge, lap, needle, and instrument counts were correct x 2.  VTE prophylaxis: SCDs. Antibiotic prophylaxis: none indicated and none given. She was awakened in the operating room and was taken to the PACU in stable condition. The assistant surgeon was an MD due to lack of availability of another Counselling psychologist.   Prentice Docker, MD 09/08/2019 8:56 AM

## 2019-09-08 NOTE — Anesthesia Procedure Notes (Signed)
Procedure Name: Intubation Performed by: Kelton Pillar, CRNA Pre-anesthesia Checklist: Patient identified, Emergency Drugs available and Suction available Patient Re-evaluated:Patient Re-evaluated prior to induction Oxygen Delivery Method: Circle system utilized Preoxygenation: Pre-oxygenation with 100% oxygen Induction Type: IV induction Ventilation: Mask ventilation without difficulty Laryngoscope Size: McGraph and 3 Grade View: Grade I Tube type: Oral Tube size: 6.5 mm Number of attempts: 1 Airway Equipment and Method: Stylet Placement Confirmation: ETT inserted through vocal cords under direct vision,  positive ETCO2,  CO2 detector and breath sounds checked- equal and bilateral Secured at: 20 cm Tube secured with: Tape Dental Injury: Teeth and Oropharynx as per pre-operative assessment

## 2019-09-08 NOTE — Discharge Instructions (Signed)

## 2019-09-08 NOTE — Anesthesia Postprocedure Evaluation (Signed)
Anesthesia Post Note  Patient: Jessica Zuniga  Procedure(s) Performed: LAPAROSCOPIC LEFT SALPINGO OOPHORECTOMY (N/A ) LAPAROSCOPIC UNILATERAL SALPINGECTOMY (Right )  Patient location during evaluation: PACU Anesthesia Type: General Level of consciousness: awake and alert and oriented Pain management: pain level controlled Vital Signs Assessment: post-procedure vital signs reviewed and stable Respiratory status: spontaneous breathing Cardiovascular status: blood pressure returned to baseline Anesthetic complications: no     Last Vitals:  Vitals:   09/08/19 0932 09/08/19 0947  BP: (!) 103/58 (!) 105/58  Pulse: 64 (!) 59  Resp: 17 16  Temp: 36.7 C 36.5 C  SpO2: 95% 100%    Last Pain:  Vitals:   09/08/19 0947  TempSrc: Temporal  PainSc: 0-No pain                 Brittish Bolinger

## 2019-09-08 NOTE — Transfer of Care (Signed)
Immediate Anesthesia Transfer of Care Note  Patient: Jessica Zuniga  Procedure(s) Performed: LAPAROSCOPIC LEFT SALPINGO OOPHORECTOMY (N/A ) LAPAROSCOPIC UNILATERAL SALPINGECTOMY (Right )  Patient Location: PACU  Anesthesia Type:General  Level of Consciousness: awake, alert , oriented and patient cooperative  Airway & Oxygen Therapy: Patient Spontanous Breathing and Patient connected to face mask oxygen  Post-op Assessment: Report given to RN and Post -op Vital signs reviewed and stable  Post vital signs: Reviewed and stable  Last Vitals:  Vitals Value Taken Time  BP 104/72 09/08/19 0900  Temp 36.8 C 09/08/19 0900  Pulse 72 09/08/19 0903  Resp 11 09/08/19 0903  SpO2 98 % 09/08/19 0903  Vitals shown include unvalidated device data.  Last Pain:  Vitals:   09/08/19 0900  TempSrc:   PainSc: 0-No pain         Complications: No apparent anesthesia complications

## 2019-09-08 NOTE — Interval H&P Note (Signed)
History and Physical Interval Note:  09/08/2019 7:21 AM  Jessica Zuniga  has presented today for surgery, with the diagnosis of Left ovarian cyst N83.202.  The various methods of treatment have been discussed with the patient and family. After consideration of risks, benefits and other options for treatment, the patient has consented to  Procedure(s): LAPAROSCOPIC LEFT SALPINGO OOPHORECTOMY (N/A), RIGHT SALPINGECTOMY, REMOVAL OF ABNORMAL TISSUE as a surgical intervention.  The patient's history has been reviewed, patient examined, no change in status, stable for surgery.  I have reviewed the patient's chart and labs.  Questions were answered to the patient's satisfaction.  Consent reviewed, updated, and signed. Patient agrees to proceed.  Prentice Docker, MD, [facility], [facility] 09/08/2019 7:22 AM

## 2019-09-09 ENCOUNTER — Telehealth: Payer: Self-pay | Admitting: Obstetrics and Gynecology

## 2019-09-09 LAB — SURGICAL PATHOLOGY

## 2019-09-09 LAB — CYTOLOGY - NON PAP

## 2019-09-09 NOTE — Telephone Encounter (Signed)
Pt left msg on triage line saying she feels great. She has only taken an ibuprofen. She thanks you for following up.

## 2019-09-09 NOTE — Telephone Encounter (Signed)
Called patient and she states that she is doing well. She has only taken two ibuprofen since the surgery.  She is ambulating, voiding, tolerating PO, and her pain is well controlled.   I discussed the pathology result with her of benign serous cystadenoma, which is a very good result. She was encouraged to call, if any problems arose and I will see her for her post-op follow up, as previously scheduled.

## 2019-09-09 NOTE — Telephone Encounter (Signed)
Left generic voicemail in follow up of her surgery yesterday.

## 2019-09-09 NOTE — Unmapped (Signed)
Margaret Berger 's Mycophenolate shipment will be delayed as a result of no refills remain on the prescription.      I have reached out to the patient and communicated the delay. We will call the patient back to reschedule the delivery upon resolution. We have not confirmed the new delivery date.

## 2019-09-12 DIAGNOSIS — K754 Autoimmune hepatitis: Principal | ICD-10-CM

## 2019-09-14 ENCOUNTER — Other Ambulatory Visit: Payer: Self-pay

## 2019-09-14 ENCOUNTER — Encounter: Payer: Self-pay | Admitting: Obstetrics and Gynecology

## 2019-09-14 ENCOUNTER — Ambulatory Visit (INDEPENDENT_AMBULATORY_CARE_PROVIDER_SITE_OTHER): Payer: BLUE CROSS/BLUE SHIELD | Admitting: Obstetrics and Gynecology

## 2019-09-14 DIAGNOSIS — N83292 Other ovarian cyst, left side: Secondary | ICD-10-CM

## 2019-09-14 DIAGNOSIS — Z09 Encounter for follow-up examination after completed treatment for conditions other than malignant neoplasm: Secondary | ICD-10-CM

## 2019-09-14 DIAGNOSIS — N83202 Unspecified ovarian cyst, left side: Secondary | ICD-10-CM

## 2019-09-14 DIAGNOSIS — K754 Autoimmune hepatitis: Principal | ICD-10-CM

## 2019-09-14 MED ORDER — MYCOPHENOLATE MOFETIL 500 MG TABLET
ORAL_TABLET | Freq: Two times a day (BID) | ORAL | 3 refills | 90.00000 days | Status: CP
Start: 2019-09-14 — End: 2020-09-13
  Filled 2019-09-14: qty 120, 30d supply, fill #0

## 2019-09-14 MED FILL — MYCOPHENOLATE MOFETIL 500 MG TABLET: 30 days supply | Qty: 120 | Fill #0 | Status: AC

## 2019-09-14 NOTE — Progress Notes (Signed)
    Virtual Visit via Telephone Note  I connected with LECIE LANGELIER on 09/14/19 at 10:30 AM EST by telephone and verified that I am speaking with the correct person using two identifiers.   I discussed the limitations, risks, security and privacy concerns of performing an evaluation and management service by telephone and the availability of in person appointments. I also discussed with the patient that there may be a patient responsible charge related to this service. The patient expressed understanding and agreed to proceed.  The patient was at [facility] in [place] I spoke with the patient from my  office The names of people involved in this encounter were: Trenda Moots and Prentice Docker, MD.   History of Present Illness: 59 y.o. G3P3 female who is 1 week post-op from a laparoscopic bilateral salpingectomy and left oophorectomy.  She says she feels great. She has no pain.  She denies fevers and chills. She has normal bladder and bowel habits. She is tolerating PO without issues. She denies issues with the incisions.     Observations/Objective: Physical Exam could not be performed. Because of the COVID-19 outbreak this visit was performed over the phone and not in person.   Assessment and Plan: 59 y.o. G3P3 female who is 1 week postop from her above surgery, doing well.    Follow Up Instructions: As needed. She lives in [place]   I discussed the assessment and treatment plan with the patient. The patient was provided an opportunity to ask questions and all were answered. The patient agreed with the plan and demonstrated an understanding of the instructions.   The patient was advised to call back or seek an in-person evaluation if the symptoms worsen or if the condition fails to improve as anticipated.  I provided 12 minutes of non-face-to-face time during this encounter.  Prentice Docker, MD  [facility], [facility] 09/14/2019 10:33 AM

## 2019-09-14 NOTE — Unmapped (Signed)
Margaret Berger 's mycophenolate shipment will be sent out  as a result of receiving a new prescription    I have reached out to the patient and communicated the delivery change. We will reschedule the medication for the delivery date that the patient agreed upon.  We have confirmed the delivery date as 3/4, via next day courier.

## 2019-09-15 NOTE — Unmapped (Signed)
Reached out to pt per Dr. Pablo Lawrence request to check in with patient regarding Cellcept. Pt reports she's doing well and is tolerating treatment well. Denies any financial difficulties.

## 2019-09-30 ENCOUNTER — Other Ambulatory Visit: Payer: Self-pay | Admitting: Obstetrics and Gynecology

## 2019-09-30 DIAGNOSIS — N83202 Unspecified ovarian cyst, left side: Secondary | ICD-10-CM

## 2019-09-30 NOTE — Telephone Encounter (Signed)
Please advise 

## 2019-10-07 NOTE — Unmapped (Signed)
error 

## 2019-10-10 NOTE — Unmapped (Signed)
[facility] Clinical Assessment & Refill Coordination Note    [name], DOB: [birth year]  Phone: [phone] (home)     All above HIPAA information was verified with patient.     Was a Nurse, learning disability used for this call? No    Specialty Medication(s):   Inflammatory Disorders: mycophenolate     Current Outpatient Medications   Medication Sig Dispense Refill   ??? ibuprofen (MOTRIN) 600 MG tablet Take 600 mg by mouth every six (6) hours as needed.     ??? ketoconazole (NIZORAL) 2 % cream Apply 1 application topically Two (2) times a day. To affected areas as directed 60 g 1   ??? mycophenolate (CELLCEPT) 500 mg tablet Take 2 tablets (1,000 mg total) by mouth 2 (two) times a day with meals. 360 tablet 3     No current facility-administered medications for this visit.         Changes to medications: Brindy reports no changes at this time.    Allergies   Allergen Reactions   ??? Azathioprine Other (See Comments)     Fever  Other reaction(s): Other (See Comments)  Fever       Changes to allergies: No    SPECIALTY MEDICATION ADHERENCE     mycophenolate 500 mg: over 2 weeks of medicine on hand     Medication Adherence    Patient reported X missed doses in the last month: 0  Specialty Medication: mycophenolate 500 mg  Patient is on additional specialty medications: No  Informant: patient  Confirmed plan for next specialty medication refill: delivery by pharmacy          Specialty medication(s) dose(s) confirmed: Regimen is correct and unchanged.     Are there any concerns with adherence? No    Adherence counseling provided? Not needed    CLINICAL MANAGEMENT AND INTERVENTION      Clinical Benefit Assessment:    Do you feel the medicine is effective or helping your condition? Yes    Clinical Benefit counseling provided? Not needed    Adverse Effects Assessment:    Are you experiencing any side effects? No    Are you experiencing difficulty administering your medicine? No    Quality of Life Assessment: How many days over the past month did your autoimmune hepatitis treated with steroids  keep you from your normal activities? For example, brushing your teeth or getting up in the morning. 0    Have you discussed this with your provider? Not needed    Therapy Appropriateness:    Is therapy appropriate? Yes, therapy is appropriate and should be continued    DISEASE/MEDICATION-SPECIFIC INFORMATION      N/A    PATIENT SPECIFIC NEEDS     ? Does the patient have any physical, cognitive, or cultural barriers? No    ? Is the patient high risk? Yes, patient is taking a REMS drug. Medication is dispensed in compliance with REMS program.     ? Does the patient require a Care Management Plan? No     ? Does the patient require physician intervention or other additional services (i.e. nutrition, smoking cessation, social work)? No      SHIPPING     Specialty Medication(s) to be Shipped:   Inflammatory Disorders: mycophenolate    Other medication(s) to be shipped: none     Changes to insurance: No    Delivery Scheduled: Yes, Expected medication delivery date: 10/24/19.     Medication will be delivered  via Same Day Courier to the confirmed prescription address in [facility].    The patient will receive a drug information handout for each medication shipped and additional FDA Medication Guides as required.  Verified that patient has previously received a Conservation officer, historic buildings.    All of the patient's questions and concerns have been addressed.    Breck Coons [facility] Specialty Pharmacist

## 2019-10-24 MED FILL — MYCOPHENOLATE MOFETIL 500 MG TABLET: ORAL | 30 days supply | Qty: 120 | Fill #1

## 2019-10-24 MED FILL — MYCOPHENOLATE MOFETIL 500 MG TABLET: 30 days supply | Qty: 120 | Fill #1 | Status: AC

## 2019-11-10 NOTE — Unmapped (Signed)
[facility] Refill Coordination Note    Specialty Medication(s) to be Shipped:   Transplant: mycophenolate mofetil 500mg     Other medication(s) to be shipped:      Margaret Berger, DOB: July 01, 1961  Phone: [phone] (home)       All above HIPAA information was verified with patient.     Was a Nurse, learning disability used for this call? No    Completed refill call assessment today to schedule patient's medication shipment from the [facility] [phone]).       Specialty medication(s) and dose(s) confirmed: Regimen is correct and unchanged.   Changes to medications: [place] reports no changes at this time.  Changes to insurance: No  Questions for the pharmacist: No    Confirmed patient received Welcome Packet with first shipment. The patient will receive a drug information handout for each medication shipped and additional FDA Medication Guides as required.       DISEASE/MEDICATION-SPECIFIC INFORMATION        N/A    SPECIALTY MEDICATION ADHERENCE     Medication Adherence    Patient reported X missed doses in the last month: 0  Specialty Medication: MYCOPHENOLATE 500MG    Patient is on additional specialty medications: No  Informant: patient  Confirmed plan for next specialty medication refill: delivery by pharmacy  Refills needed for supportive medications: not needed          Refill Coordination    Has the Patients' Contact Information Changed: No  Is the Shipping Address Different: Yes  Updated Shipping Address: [address] [place] [address]           MYCOPHENOLATE 500 mg: 16 days of medicine on hand          SHIPPING     Shipping address confirmed in Epic.     Delivery Scheduled: Yes, Expected medication delivery date: 5/11.     Medication will be delivered via Next Day Courier to the prescription address in Epic W[facility].    Jolene Schimke   [facility] Specialty Technician

## 2019-11-21 MED FILL — MYCOPHENOLATE MOFETIL 500 MG TABLET: ORAL | 30 days supply | Qty: 120 | Fill #2

## 2019-11-21 MED FILL — MYCOPHENOLATE MOFETIL 500 MG TABLET: 30 days supply | Qty: 120 | Fill #2 | Status: AC

## 2019-12-05 ENCOUNTER — Encounter: Admit: 2019-12-05 | Discharge: 2019-12-06 | Payer: PRIVATE HEALTH INSURANCE

## 2019-12-05 DIAGNOSIS — K759 Inflammatory liver disease, unspecified: Secondary | ICD-10-CM | POA: Diagnosis not present

## 2019-12-05 DIAGNOSIS — Z1289 Encounter for screening for malignant neoplasm of other sites: Secondary | ICD-10-CM | POA: Diagnosis not present

## 2019-12-05 DIAGNOSIS — R7989 Other specified abnormal findings of blood chemistry: Secondary | ICD-10-CM | POA: Diagnosis not present

## 2019-12-05 DIAGNOSIS — K769 Liver disease, unspecified: Secondary | ICD-10-CM | POA: Diagnosis not present

## 2019-12-05 DIAGNOSIS — K7460 Unspecified cirrhosis of liver: Secondary | ICD-10-CM | POA: Diagnosis not present

## 2019-12-05 DIAGNOSIS — C249 Malignant neoplasm of biliary tract, unspecified: Secondary | ICD-10-CM | POA: Diagnosis not present

## 2019-12-05 DIAGNOSIS — K754 Autoimmune hepatitis: Secondary | ICD-10-CM | POA: Diagnosis not present

## 2019-12-05 NOTE — Unmapped (Signed)
Orders for labs to [facility] via portal

## 2019-12-06 DIAGNOSIS — K739 Chronic hepatitis, unspecified: Secondary | ICD-10-CM | POA: Diagnosis not present

## 2019-12-06 DIAGNOSIS — H35361 Drusen (degenerative) of macula, right eye: Secondary | ICD-10-CM | POA: Diagnosis not present

## 2019-12-06 DIAGNOSIS — K759 Inflammatory liver disease, unspecified: Secondary | ICD-10-CM | POA: Diagnosis not present

## 2019-12-06 DIAGNOSIS — R7989 Other specified abnormal findings of blood chemistry: Secondary | ICD-10-CM | POA: Diagnosis not present

## 2019-12-06 LAB — CBC W/ DIFFERENTIAL
BANDED NEUTROPHILS ABSOLUTE COUNT: 0 10*3/uL (ref 0.0–0.1)
BASOPHILS ABSOLUTE COUNT: 0 10*3/uL (ref 0.0–0.2)
BASOPHILS RELATIVE PERCENT: 0 %
EOSINOPHILS ABSOLUTE COUNT: 0.1 10*3/uL (ref 0.0–0.4)
EOSINOPHILS RELATIVE PERCENT: 2 %
HEMOGLOBIN: 12.9 g/dL (ref 11.1–15.9)
IMMATURE GRANULOCYTES: 0 %
LYMPHOCYTES RELATIVE PERCENT: 25 %
MEAN CORPUSCULAR HEMOGLOBIN CONC: 34.6 g/dL (ref 31.5–35.7)
MEAN CORPUSCULAR HEMOGLOBIN: 31.8 pg (ref 26.6–33.0)
MONOCYTES ABSOLUTE COUNT: 0.5 10*3/uL (ref 0.1–0.9)
MONOCYTES RELATIVE PERCENT: 7 %
NEUTROPHILS ABSOLUTE COUNT: 4.4 10*3/uL (ref 1.4–7.0)
NEUTROPHILS RELATIVE PERCENT: 66 %
PLATELET COUNT: 172 10*3/uL (ref 150–450)
RED BLOOD CELL COUNT: 4.06 x10E6/uL (ref 3.77–5.28)
RED CELL DISTRIBUTION WIDTH: 12.3 % (ref 11.7–15.4)
WHITE BLOOD CELL COUNT: 6.7 10*3/uL (ref 3.4–10.8)

## 2019-12-06 LAB — COMPREHENSIVE METABOLIC PANEL
A/G RATIO: 2.5 — ABNORMAL HIGH (ref 1.2–2.2)
ALKALINE PHOSPHATASE: 53 IU/L (ref 48–121)
AST (SGOT): 29 IU/L (ref 0–40)
BILIRUBIN TOTAL: 0.3 mg/dL (ref 0.0–1.2)
BLOOD UREA NITROGEN: 18 mg/dL (ref 8–27)
BUN / CREAT RATIO: 24 (ref 12–28)
CALCIUM: 9.6 mg/dL (ref 8.7–10.3)
CHLORIDE: 103 mmol/L (ref 96–106)
CO2: 23 mmol/L (ref 20–29)
CREATININE: 0.74 mg/dL (ref 0.57–1.00)
GLOBULIN, TOTAL: 1.9 g/dL (ref 1.5–4.5)
GLUCOSE: 97 mg/dL (ref 65–99)
POTASSIUM: 4.7 mmol/L (ref 3.5–5.2)
SODIUM: 141 mmol/L (ref 134–144)
TOTAL PROTEIN: 6.6 g/dL (ref 6.0–8.5)

## 2019-12-06 LAB — AFP-TUMOR MARKER: Alpha-1-Fetoprotein.tumor marker:MCnc:Pt:Ser/Plas:Qn:: 4.4

## 2019-12-06 LAB — NEUTROPHILS RELATIVE PERCENT: Neutrophils/100 leukocytes:NFr:Pt:Bld:Qn:Automated count: 66

## 2019-12-06 LAB — CREATININE: Creatinine:MCnc:Pt:Ser/Plas:Qn:: 0.74

## 2019-12-06 NOTE — Unmapped (Signed)
Added orders to existing lab orders to [facility] via portal.

## 2019-12-13 LAB — BILIRUBIN DIRECT: Bilirubin.glucuronidated+Bilirubin.albumin bound:MCnc:Pt:Ser/Plas:Qn:: 0.12

## 2019-12-13 LAB — INR: Coagulation tissue factor induced.INR:RelTime:Pt:PPP:Qn:Coag: 1

## 2019-12-22 NOTE — Unmapped (Signed)
[facility] Refill Coordination Note    Specialty Medication(s) to be Shipped:   General Specialty: mycophenolate 500mg     Other medication(s) to be shipped: n/a     Margaret Berger, DOB: 02-Sep-1960  Phone: [phone] (home)       All above HIPAA information was verified with patient.     Was a Nurse, learning disability used for this call? No    Completed refill call assessment today to schedule patient's medication shipment from the [facility] [phone]).       Specialty medication(s) and dose(s) confirmed: Regimen is correct and unchanged.   Changes to medications: Margaret Berger reports no changes at this time.  Changes to insurance: No  Questions for the pharmacist: No    Confirmed patient received Welcome Packet with first shipment. The patient will receive a drug information handout for each medication shipped and additional FDA Medication Guides as required.       DISEASE/MEDICATION-SPECIFIC INFORMATION        N/A    SPECIALTY MEDICATION ADHERENCE     Medication Adherence    Patient reported X missed doses in the last month: 0  Specialty Medication: mycophenolate 500mg   Patient is on additional specialty medications: No                Mycophenolate 500mg   : 10 days of medicine on hand         SHIPPING     Shipping address confirmed in Epic.     Delivery Scheduled: Yes, Expected medication delivery date: 6/15.     Medication will be delivered via UPS to the temporary address in Epic WAM.    Margaret Berger   [facility] Specialty Technician

## 2019-12-26 ENCOUNTER — Other Ambulatory Visit: Payer: Self-pay | Admitting: Internal Medicine

## 2019-12-26 DIAGNOSIS — Z1231 Encounter for screening mammogram for malignant neoplasm of breast: Secondary | ICD-10-CM

## 2019-12-26 MED FILL — MYCOPHENOLATE MOFETIL 500 MG TABLET: 30 days supply | Qty: 120 | Fill #3 | Status: AC

## 2019-12-26 MED FILL — MYCOPHENOLATE MOFETIL 500 MG TABLET: ORAL | 30 days supply | Qty: 120 | Fill #3

## 2020-01-19 MED FILL — MYCOPHENOLATE MOFETIL 500 MG TABLET: 30 days supply | Qty: 120 | Fill #4 | Status: AC

## 2020-01-19 MED FILL — MYCOPHENOLATE MOFETIL 500 MG TABLET: ORAL | 30 days supply | Qty: 120 | Fill #4

## 2020-01-19 NOTE — Unmapped (Signed)
[facility] Refill Coordination Note    Specialty Medication(s) to be Shipped:   General Specialty: mycophenolate 500mg     Other medication(s) to be shipped: N/A     Margaret Berger, DOB: Apr 13, 1961  Phone: [phone] (home)       All above HIPAA information was verified with patient.     Was a Nurse, learning disability used for this call? No    Completed refill call assessment today to schedule patient's medication shipment from the [facility] [phone]).       Specialty medication(s) and dose(s) confirmed: Regimen is correct and unchanged.   Changes to medications: Margaret Berger reports no changes at this time.  Changes to insurance: No  Questions for the pharmacist: No    Confirmed patient received Welcome Packet with first shipment. The patient will receive a drug information handout for each medication shipped and additional FDA Medication Guides as required.       DISEASE/MEDICATION-SPECIFIC INFORMATION        N/A    SPECIALTY MEDICATION ADHERENCE     Medication Adherence    Patient reported X missed doses in the last month: 0  Specialty Medication: Mycophenolate 500mg   Patient is on additional specialty medications: No                Mycophenolate 500 mg: 5 days of medicine on hand         SHIPPING     Shipping address confirmed in Epic.     Delivery Scheduled: Yes, Expected medication delivery date: 01/20/20.     Medication will be delivered via UPS to the prescription address in Epic WAM.    Margaret Berger [facility] Specialty Technician

## 2020-01-24 ENCOUNTER — Ambulatory Visit
Admission: RE | Admit: 2020-01-24 | Discharge: 2020-01-24 | Disposition: A | Discharge status: Home / Self Care | Payer: BLUE CROSS/BLUE SHIELD | Source: Ambulatory Visit | Attending: Internal Medicine | Admitting: Internal Medicine

## 2020-01-24 DIAGNOSIS — Z1231 Encounter for screening mammogram for malignant neoplasm of breast: Secondary | ICD-10-CM | POA: Insufficient documentation

## 2020-01-25 ENCOUNTER — Other Ambulatory Visit: Payer: Self-pay | Admitting: Internal Medicine

## 2020-01-25 ENCOUNTER — Encounter
Admit: 2020-01-25 | Discharge: 2020-01-26 | Payer: PRIVATE HEALTH INSURANCE | Attending: Gastroenterology | Primary: Gastroenterology

## 2020-01-25 DIAGNOSIS — R921 Mammographic calcification found on diagnostic imaging of breast: Secondary | ICD-10-CM

## 2020-01-25 DIAGNOSIS — Z6824 Body mass index (BMI) 24.0-24.9, adult: Secondary | ICD-10-CM | POA: Diagnosis not present

## 2020-01-25 DIAGNOSIS — R928 Other abnormal and inconclusive findings on diagnostic imaging of breast: Secondary | ICD-10-CM

## 2020-01-25 DIAGNOSIS — K754 Autoimmune hepatitis: Secondary | ICD-10-CM | POA: Diagnosis not present

## 2020-01-25 LAB — CBC W/ AUTO DIFF
BASOPHILS ABSOLUTE COUNT: 0 10*9/L (ref 0.0–0.1)
BASOPHILS RELATIVE PERCENT: 0.4 %
EOSINOPHILS ABSOLUTE COUNT: 0.1 10*9/L (ref 0.0–0.7)
HEMATOCRIT: 40.9 % (ref 35.0–44.0)
HEMOGLOBIN: 13.4 g/dL (ref 12.0–15.5)
LYMPHOCYTES ABSOLUTE COUNT: 1.5 10*9/L (ref 0.7–4.0)
LYMPHOCYTES RELATIVE PERCENT: 28.5 %
MEAN CORPUSCULAR HEMOGLOBIN CONC: 32.8 g/dL (ref 30.0–36.0)
MEAN CORPUSCULAR HEMOGLOBIN: 31 pg (ref 26.0–34.0)
MEAN PLATELET VOLUME: 8.2 fL (ref 7.0–10.0)
MONOCYTES ABSOLUTE COUNT: 0.4 10*9/L (ref 0.1–1.0)
NEUTROPHILS ABSOLUTE COUNT: 3.4 10*9/L (ref 1.7–7.7)
NEUTROPHILS RELATIVE PERCENT: 62.2 %
NUCLEATED RED BLOOD CELLS: 0 /100{WBCs} (ref <=4)
PLATELET COUNT: 193 10*9/L (ref 150–450)
RED BLOOD CELL COUNT: 4.33 10*12/L (ref 3.90–5.03)
RED CELL DISTRIBUTION WIDTH: 13.5 % (ref 12.0–15.0)
WBC ADJUSTED: 5.4 10*9/L (ref 3.5–10.5)

## 2020-01-25 LAB — COMPREHENSIVE METABOLIC PANEL
ALBUMIN: 4.1 g/dL (ref 3.4–5.0)
ALKALINE PHOSPHATASE: 52 U/L (ref 46–116)
ALT (SGPT): 22 U/L (ref 10–49)
AST (SGOT): 22 U/L (ref <=34)
BILIRUBIN TOTAL: 0.6 mg/dL (ref 0.3–1.2)
BLOOD UREA NITROGEN: 14 mg/dL (ref 9–23)
BUN / CREAT RATIO: 21
CALCIUM: 10.3 mg/dL (ref 8.7–10.4)
CHLORIDE: 104 mmol/L (ref 98–107)
CO2: 26.9 mmol/L (ref 20.0–31.0)
CREATININE: 0.66 mg/dL
EGFR CKD-EPI AA FEMALE: >90 mL/min/{1.73_m2} (ref >=60)
EGFR CKD-EPI NON-AA FEMALE: >90 mL/min/{1.73_m2} (ref >=60)
PROTEIN TOTAL: 7 g/dL (ref 5.7–8.2)
SODIUM: 136 mmol/L (ref 135–145)

## 2020-01-25 LAB — EOSINOPHILS RELATIVE PERCENT: Eosinophils/100 leukocytes:NFr:Pt:Bld:Qn:Automated count: 2.1

## 2020-01-25 LAB — EGFR CKD-EPI AA FEMALE: Glomerular filtration rate/1.73 sq M.predicted.black:ArVRat:Pt:Ser/Plas/Bld:Qn:Creatinine-based formula (CKD-EPI): >90

## 2020-01-25 LAB — PROTIME: Coagulation tissue factor induced:Time:Pt:PPP:Qn:Coag: 10.7

## 2020-01-25 LAB — PROTIME-INR: INR: 0.89

## 2020-01-25 NOTE — Unmapped (Signed)
[facility] LIVER CENTER    Margaret Berger, M.D.  Professor of Medicine  Director, [facility]  [place] [facility] [place] [facility] [place]    [phone]    Margaret Berger    Referring MD: Margaret Minium, MD    Phone visit 06/08/19     Chief complaint: Office follow-up for elevation of liver enzymes. Hospitalization (08/2016) for acute hepatitis of uncertain etiology, presumed autoimmune hepatitis treated with prednisone and mycophenylate. DRUG FEVER ASSOCIATED WITH AZATHIOPRINE. STEROIDS STOPPED 07/2017. CONTINUES ON CELLCEPT 1 GRAM BID FOR IMMUNOSUPPRESSION    Interval history: Patient continues to feel great. She denies fever, cough, sob, N/V, abdominal pain, chest pain, or skin rash.  She has been maintained on mycophenylate 1gram bid. No new health related concerns. She has received COVID vaccine in [place]. Last MRI showed an incidental adnexal cyst which was surgically removed on 09/08/19, per patient benign.      Present illness at presentation:  Patient is a 59 y.o. Caucasian female who was hospitalized at [facility] between approximately February 12 -  August 29, 2016 with acute hepatitis. The patient had about 10 weeks of abnormal liver tests documented by her primary physician and her primary gastroenterologist. This was preceded by a short flu-like illness characterized by fatigue, muscle aches, and low-grade fever. All of the symptoms have completely remained resolved since her  Visit 09/10/2016. However she was left with elevated liver tests and had an extensive workup as an outpatient and subsequently as an inpatient at [facility] to further define the etiology of her liver disease.  Additional details of her history and prior outpatient evaluation as well as her inpatient evaluation are available from our initial consultation note and follow-up notes while she was hospitalized.       After review of the liver biopsy and all serologic data thus far which has been unrevealing, it was felt that a therapeutic trial of prednisone for presumed seronegative autoimmune hepatitis was warranted. Prednisone  40 mg of daily started on 08/29/16. She had a good biochemical response with marked improvement in ALT, INR, including a slow but steady decrease in Total/direct bilirubin.     Patient had an extensive serological evaluation which was not revealing:  1. Antinuclear antibody and anti-smooth muscle antibody were negative. Immunoglobulin G was not elevated (1061)  2. Viral serologies including acute hepatitis A, hepatitis B, hepatitis C, HCV RNA, EBV PCR, CMV PCR, HHV-6 were all negative.  3. Liver ultrasound demonstrated patent vasculature with a nodular-appearing heterogeneous liver.  4. Percutaneous liver biopsy 08/2016 at [facility]:    A: Liver, core biopsy   - Severe active hepatitis with confluent areas of periportal hepatocellular necrosis (approximately 10-20% of sampled parenchyma) (see comment)   - Trichrome stain demonstrates at least periportal fibrosis and is suspicious for bridging fibrosis     Potential etiologies to consider include, but are not limited to, infection, drug/toxin-induced liver injury, seronegative autoimmune hepatitis, and metabolic disorders. The trichrome stain findings suggest that this process is at least subacute in duration.      Liver biopsy slides were personally reviewed by Dr. Sharon Berger  with the pathologist.  No viral inclusions were noted and only few plasma cells were visualized in the liver biopsy which was an adequate specimen. No cytopathic effect. EBV immunostains were negative. Fe in hepatocytes only grade 1/4    5.  Evaluation for Zoster: Dr. Foy Berger received phone call from patient's primary GI MD that patient's Zoster IgM from 09/05/2016 was  positive with titer 1.17 approximately one week after starting prednisone. Review of her record though revealed this test to have been negative on 08/29/2016.  Dr. Sharon Berger discussed these findings with Dr. Lytle Berger and the decision was made to proceed with patient starting Valtrex 500 mg bid.  She was seen by Dr. 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   Recommendations from ID: Continue valacyclovir 500 mg bid.  Advised readdressing stopping Valtrex if her repeat IgM is negative or once she is on <15 mg prednisone daily.  Valtrex was discontinued last week.  Bactrim prophylaxis while prednisone >15 mg q 24. 1 DS tablet M/W/F. The following vaccines were recommended for her to obtain under the care of her PCP: Twinrix, PCV13, and PPSV23. She should have these performed by her PCP when prednisone dose is  <20 mg daily.     Immunosuppressive history:  Patient was started on azathioprine for steroid sparing benefits in early April.  After 2 weeks patient developed fevers. ID workup negative during hospitalization.  Azathioprine was held during that hospitalization and fevers resolved. When she restarted azathioprine as outpatient, immediately had high fevers and shaking chills.  This rechallenge was highly suggestive of drug fever from azathioprine.    On 5/12 patient was started on CellCept 500mg  bid and continued on prednisone 15 mg daily.  On 12/03/16, liver enzymes remained elevated. Cellcept was increased to 1000mg  BID on 12/04/16. Prednisone tapered to off as of 07/2017.     10 sys ROS otherwise negative.     Past medical history:  Patient has generally been healthy until the current hepatitis began in December 2017.  1. No history of diabetes, coronary artery disease, hypertension, or lung disease.  2. No history of other autoimmune systemic diseases  3. FIBROSCAN 11/05/16: 17.7 kps c/w stage F4 (Patient ate breakfast 3 hours prior).  4. MRI 10/2017: HEPATOBILIARY: Nodular liver contour compatible with history of cirrhosis and similar to prior. Again seen is a geographic region of T1 hypointensity and corresponding slight T2 hyperintensity in the posterior right hepatic lobe which is overall similar in extent when compared to prior imaging. This region demonstrates progressive postcontrast enhancement as before. Overall similar degree of capsular retraction most pronounced along the right anterior margin of the liver (13:29). No discrete early arterially enhancing or washout liver lesion identified. No biliary ductal dilatation. Similar appearance and position of the borderline hydropic gallbladder.  PANCREAS: Pancreas divisum. No ductal dilatation.    5. MRI 05/2019: Morphologic features consistent with history of cirrhosis. No sequela of portal hypertension.No MR evidence of HCC.  Similar appearing probable confluent fibrosis in the right hepatic lobe. Incidental right adnexal cyst.    Allergies   Allergen Reactions   ??? Azathioprine Other (See Comments)     Fever  Other reaction(s): Other (See Comments)  Fever       Current Outpatient Medications   Medication Sig Dispense Refill   ??? ibuprofen (MOTRIN) 600 MG tablet Take 600 mg by mouth every six (6) hours as needed.     ??? mycophenolate (CELLCEPT) 500 mg tablet Take 2 tablets (1,000 mg total) by mouth 2 (two) times a day with meals. 360 tablet 3   ??? ketoconazole (NIZORAL) 2 % cream Apply 1 application topically Two (2) times a day. To affected areas as directed (Patient not taking: Reported on 01/25/2020) 60 g 1     No current facility-administered medications for  this visit.     Social history: The patient is married. She works as a Materials engineer. They have traveled extensively.   She does not drink any alcohol. She does not smoke cigarettes.    Family history: Negative for liver disease or liver cancer. Negative for autoimmune systemic diseases.    Physical Examination: No exam performed as this was video visit.      Results for orders placed or performed in visit on 01/25/20   PT-INR   Result Value Ref Range    PT 10.7 10.5 - 13.5 sec    INR 0.89    Comprehensive Metabolic Panel   Result Value Ref Range    Sodium 136 135 - 145 mmol/L    Potassium 4.4 3.4 - 4.5 mmol/L    Chloride 104 98 - 107 mmol/L    Anion Gap 5 5 - 14 mmol/L    CO2 26.9 20.0 - 31.0 mmol/L    BUN 14 9 - 23 mg/dL    Creatinine 1.61 0.96 - 0.80 mg/dL    BUN/Creatinine Ratio 21     EGFR CKD-EPI Non-African American, Female >90 >=60 mL/min/1.52m2    EGFR CKD-EPI African American, Female >90 >=60 mL/min/1.72m2    Glucose 106 70 - 179 mg/dL    Calcium 04.5 8.7 - 40.9 mg/dL    Albumin 4.1 3.4 - 5.0 g/dL    Total Protein 7.0 5.7 - 8.2 g/dL    Total Bilirubin 0.6 0.3 - 1.2 mg/dL    AST 22 <=81 U/L    ALT 22 10 - 49 U/L    Alkaline Phosphatase 52 46 - 116 U/L   CBC w/ Differential   Result Value Ref Range    WBC 5.4 3.5 - 10.5 10*9/L    RBC 4.33 3.90 - 5.03 10*12/L    HGB 13.4 12.0 - 15.5 g/dL    HCT 19.1 47.8 - 29.5 %    MCV 94.5 82.0 - 98.0 fL    MCH 31.0 26.0 - 34.0 pg    MCHC 32.8 30.0 - 36.0 g/dL    RDW 62.1 30.8 - 65.7 %    MPV 8.2 7.0 - 10.0 fL    Platelet 193 150 - 450 10*9/L    nRBC 0 <=4 /100 WBCs    Neutrophils % 62.2 %    Lymphocytes % 28.5 %    Monocytes % 6.8 %    Eosinophils % 2.1 %    Basophils % 0.4 %    Absolute Neutrophils 3.4 1.7 - 7.7 10*9/L    Absolute Lymphocytes 1.5 0.7 - 4.0 10*9/L    Absolute Monocytes 0.4 0.1 - 1.0 10*9/L    Absolute Eosinophils 0.1 0.0 - 0.7 10*9/L    Absolute Basophils 0.0 0.0 - 0.1 10*9/L     MELD-Na score: 6 at 01/25/2020  9:07 AM  MELD score: 6 at 01/25/2020  9:07 AM  Calculated from:  Serum Creatinine: 0.66 mg/dL (Using min of 1 mg/dL) at 8/46/9629  5:28 AM  Serum Sodium: 136 mmol/L at 01/25/2020  9:07 AM  Total Bilirubin: 0.6 mg/dL (Using min of 1 mg/dL) at 10/25/2438  1:02 AM  INR(ratio): 0.89 (Using min of 1) at 01/25/2020  9:07 AM  Age: 12 years      Impression:  1. Seronegative autoimmune hepatitis based on response to immunosuppressive medication. The patient presented with severe hepatitis as evidenced by hyperbilirubinemia, elevation of her INR, decreased albumin, and results from her liver biopsy. Patient was started on a therapeutic  trial of prednisone for the reasons discussed above. After only 4 days of immunosuppression, her liver enzymes (ALT)  that had been regularly elevated above 1000 decreased substantially.  This was highly suggestive of a positive effect of the prednisone and was c/w  presumed ANA-negative/SMA-negative autoimmune hepatitis.Labs showed continued normalization of bilirubin and INR, although she remained with mildly elevated ALT in the 40-45 range. CellCept was added on 5/12 and increased to 1000mg  BID on 12/04/16.Steroids tapered to off as of 07/2017.    We will maintain current level of CellCept.  ALT normal.  Patient feels completely well and labs stable.     2. Cirrhosis, well compensated:  FIBROSCAN suggestive of cirrhosis and MRI suggestive of nodular contour with focal fibrosis.  Liver biopsy during acute episode did demonstrate possible bridging fibrosis. Inflammation now resolved.Defer repeat liver biopsy but may reconsider after prolonged remission to reevaluate, as will not change management at this point.     3. HCC surveillance: Findings of irrhosis as described above. 11/2019-MRI with evidence of cirrhosis. Unchanged area of focal fibrosis.  No evidence of enhancing mass. Unchanged from prior. Repeat in 3  months at next visit.      4. Drug fevers from Azathioprine: resolved, no recurrent fever.    5. Mild thrombocytopenia: Resolved. Normal size spleen.     4. Twinrix x 3 completed.     5. . Vaccinations: Completed Shingrix. She will get pneumovax at PCP.     6. Lymphopenia: Resolved.    7. Incidental adnexal cyst seen on MRI: Benign after excision.     8. Vaccinated for COVID.      RTC 3 months. MRI and labs ordered for same time.     Margaret Berger, M.D.  Professor of Medicine  Director, [facility]  [place] [facility] [place] at [place]    [phone]

## 2020-01-25 NOTE — Unmapped (Signed)
Doing well on Cellcept. Labs today. MRI in November. Return to clinic in 3 months.

## 2020-02-09 NOTE — Unmapped (Signed)
[facility] Specialty Pharmacy Refill Coordination Note    Specialty Medication(s) to be Shipped:   Transplant: mycophenolate mofetil 500mg     Other medication(s) to be shipped: No additional medications requested for fill at this time     Margaret Berger, DOB: [birth year]  Phone: [phone] (home)       All above HIPAA information was verified with patient.     Was a Nurse, learning disability used for this call? No    Completed refill call assessment today to schedule patient's medication shipment from the [facility] [phone]).       Specialty medication(s) and dose(s) confirmed: Regimen is correct and unchanged.   Changes to medications: Da reports no changes at this time.  Changes to insurance: No  Questions for the pharmacist: No    Confirmed patient received Welcome Packet with first shipment. The patient will receive a drug information handout for each medication shipped and additional FDA Medication Guides as required.       DISEASE/MEDICATION-SPECIFIC INFORMATION        N/A    SPECIALTY MEDICATION ADHERENCE     Medication Adherence    Patient reported X missed doses in the last month: 0  Specialty Medication: MYCOPHENOLATE 500 MG   Patient is on additional specialty medications: No  Informant: patient  Confirmed plan for next specialty medication refill: delivery by pharmacy  Refills needed for supportive medications: not needed          Refill Coordination    Has the Patients' Contact Information Changed: No  Is the Shipping Address Different: Yes  Updated Shipping Address: [address] [place] [address]           MYCOPHENOLATE 500 mg: 9 days of medicine on hand         SHIPPING     Shipping address confirmed in Epic.     Delivery Scheduled: Yes, Expected medication delivery date: 8/4.     Medication will be delivered via UPS to the prescription address in Epic WAM.    Margaret Berger   [facility] Specialty Technician

## 2020-02-14 NOTE — Unmapped (Signed)
Margaret Berger 's mycophenolate shipment will be delayed as a result of the medication is too soon to refill until 8/6.     I have reached out to the patient and communicated the delivery change. We will reschedule the medication for the delivery date that the patient agreed upon.  We have confirmed the delivery date as 8/9, via ups.

## 2020-02-17 MED FILL — MYCOPHENOLATE MOFETIL 500 MG TABLET: 30 days supply | Qty: 120 | Fill #5 | Status: AC

## 2020-02-17 MED FILL — MYCOPHENOLATE MOFETIL 500 MG TABLET: ORAL | 30 days supply | Qty: 120 | Fill #5

## 2020-02-20 DIAGNOSIS — S76011A Strain of muscle, fascia and tendon of right hip, initial encounter: Secondary | ICD-10-CM | POA: Diagnosis not present

## 2020-02-21 ENCOUNTER — Other Ambulatory Visit: Payer: Self-pay

## 2020-02-21 ENCOUNTER — Ambulatory Visit
Admission: RE | Admit: 2020-02-21 | Discharge: 2020-02-21 | Disposition: A | Discharge status: Home / Self Care | Payer: BLUE CROSS/BLUE SHIELD | Source: Ambulatory Visit | Attending: Internal Medicine | Admitting: Internal Medicine

## 2020-02-21 DIAGNOSIS — R921 Mammographic calcification found on diagnostic imaging of breast: Secondary | ICD-10-CM | POA: Insufficient documentation

## 2020-02-21 DIAGNOSIS — R928 Other abnormal and inconclusive findings on diagnostic imaging of breast: Secondary | ICD-10-CM

## 2020-03-13 NOTE — Unmapped (Signed)
[facility] Specialty Pharmacy Clinical Assessment & Refill Coordination Note    Margaret Berger, DOB: [birth year]  Phone: [phone] (home)     All above HIPAA information was verified with patient.     Was a Nurse, learning disability used for this call? No    Specialty Medication(s):   Inflammatory Disorders: mycophenolate     Current Outpatient Medications   Medication Sig Dispense Refill   ??? ibuprofen (MOTRIN) 600 MG tablet Take 600 mg by mouth every six (6) hours as needed.     ??? ketoconazole (NIZORAL) 2 % cream Apply 1 application topically Two (2) times a day. To affected areas as directed (Patient not taking: Reported on 01/25/2020) 60 g 1   ??? mycophenolate (CELLCEPT) 500 mg tablet Take 2 tablets (1,000 mg total) by mouth 2 (two) times a day with meals. 360 tablet 3     No current facility-administered medications for this visit.        Changes to medications: Lillyanne reports no changes at this time.    Allergies   Allergen Reactions   ??? Azathioprine Other (See Comments)     Fever  Other reaction(s): Other (See Comments)  Fever       Changes to allergies: No    SPECIALTY MEDICATION ADHERENCE     mycophenolate 500 mg: ~10 days of medicine on hand            Specialty medication(s) dose(s) confirmed: Regimen is correct and unchanged.     Are there any concerns with adherence? No    Adherence counseling provided? Not needed    CLINICAL MANAGEMENT AND INTERVENTION      Clinical Benefit Assessment:    Do you feel the medicine is effective or helping your condition? Yes    Clinical Benefit counseling provided? Not needed    Adverse Effects Assessment:    Are you experiencing any side effects? No    Are you experiencing difficulty administering your medicine? No    Quality of Life Assessment:    How many days over the past month did your condition  keep you from your normal activities? For example, brushing your teeth or getting up in the morning. 0    Have you discussed this with your provider? Not needed    Therapy Appropriateness:    Is therapy appropriate? Yes, therapy is appropriate and should be continued    DISEASE/MEDICATION-SPECIFIC INFORMATION      N/A    PATIENT SPECIFIC NEEDS     - Does the patient have any physical, cognitive, or cultural barriers? No    - Is the patient high risk? Yes, patient is taking a REMS drug. Medication is dispensed in compliance with REMS program    - Does the patient require a Care Management Plan? No     - Does the patient require physician intervention or other additional services (i.e. nutrition, smoking cessation, social work)? No      SHIPPING     Specialty Medication(s) to be Shipped:   Inflammatory Disorders: mycophenolate    Other medication(s) to be shipped: No additional medications requested for fill at this time     Changes to insurance: No    Delivery Scheduled: Yes, Expected medication delivery date: 03/16/20.     Medication will be delivered via UPS to the confirmed temporary address in [facility].    The patient will receive a drug information handout for each medication shipped and additional FDA Medication Guides as required.  Verified that  patient has previously received a Conservation officer, historic buildings.    All of the patient's questions and concerns have been addressed.    Arnold Long   [facility] Specialty Pharmacist

## 2020-03-15 NOTE — Unmapped (Signed)
[name] called back about the delivery for mycophenolate and would like the delivery to ship out 03/20/2020 via UPS or [facility] [facility] to be delivered 03/21/2020 . We have confirmed the delivery via UPS delivery .

## 2020-03-15 NOTE — Unmapped (Signed)
Margaret Berger 's Mycophenolate shipment will be delayed as a result of the medication is too soon to refill until 03/18/2020.     I have reached out to the patient and was unable to leave a message. We will wait for a call back from the patient to reschedule the delivery.  We have not confirmed the new delivery date.

## 2020-03-20 MED FILL — MYCOPHENOLATE MOFETIL 500 MG TABLET: ORAL | 30 days supply | Qty: 120 | Fill #6

## 2020-03-20 MED FILL — MYCOPHENOLATE MOFETIL 500 MG TABLET: 30 days supply | Qty: 120 | Fill #6 | Status: AC

## 2020-04-18 NOTE — Unmapped (Signed)
[facility] Pharm[facility] Refill Coordination Note    Specialty Medication(s) to be Shipped:   Infectious Disease: Mycophenolate    Other medication(s) to be shipped: No additional medications requested for fill at this time     Margaret Berger, DOB: 1961/04/02  Phone: [phone] (home)       All above HIPAA information was verified with patient.     Was a Nurse, learning disability used for this call? No    Completed refill call assessment today to schedule patient's medication shipment from the [facility] [phone]).       Specialty medication(s) and dose(s) confirmed: Regimen is correct and unchanged.   Changes to medications: Felicia reports no changes at this time.  Changes to insurance: No  Questions for the pharmacist: No    Confirmed patient received Welcome Packet with first shipment. The patient will receive a drug information handout for each medication shipped and additional FDA Medication Guides as required.       DISEASE/MEDICATION-SPECIFIC INFORMATION        N/A    SPECIALTY MEDICATION ADHERENCE     Medication Adherence    Patient reported X missed doses in the last month: 0  Specialty Medication: mycophenolate 500mg                 Mycophenolate 500mg   : 11 days of medicine on hand         SHIPPING     Shipping address confirmed in Epic.     Delivery Scheduled: Yes, Expected medication delivery date: 10/12.     Medication will be delivered via UPS to the temporary address in Epic WAM.    Margaret Berger   [facility] Specialty Technician

## 2020-04-23 MED FILL — MYCOPHENOLATE MOFETIL 500 MG TABLET: ORAL | 30 days supply | Qty: 120 | Fill #7

## 2020-04-23 MED FILL — MYCOPHENOLATE MOFETIL 500 MG TABLET: 30 days supply | Qty: 120 | Fill #7 | Status: AC

## 2020-05-18 NOTE — Unmapped (Signed)
[facility] Special[facility] Pharm[facility] Refill Coordination Note    Specialty Medication(s) to be Shipped:   Infectious Disease: Mycophenolate    Other medication(s) to be shipped: No additional medications requested for fill at this time     Margaret Berger, DOB: 30-Dec-1960  Phone: [phone] (home)       All above HIPAA information was verified with patient.     Was a Nurse, learning disability used for this call? No    Completed refill call assessment today to schedule patient's medication shipment from the [facility] [phone]).       Specialty medication(s) and dose(s) confirmed: Regimen is correct and unchanged.   Changes to medications: [name] reports no changes at this time.  Changes to insurance: No  Questions for the pharmacist: No    Confirmed patient received Welcome Packet with first shipment. The patient will receive a drug information handout for each medication shipped and additional FDA Medication Guides as required.       DISEASE/MEDICATION-SPECIFIC INFORMATION        N/A    SPECIALTY MEDICATION ADHERENCE     Medication Adherence    Patient reported X missed doses in the last month: 0  Specialty Medication: mycophenolate 500mg                 Mycophenolate 500mg   : 10 days of medicine on hand         SHIPPING     Shipping address confirmed in Epic.     Delivery Scheduled: Yes, Expected medication delivery date: 11/10.     Medication will be delivered via UPS to the temporary address in Epic WAM.    [name]   [facility] Specialty Technician

## 2020-06-12 ENCOUNTER — Ambulatory Visit: Admit: 2020-06-12 | Discharge: 2020-06-13 | Payer: PRIVATE HEALTH INSURANCE

## 2020-06-12 ENCOUNTER — Encounter: Admit: 2020-06-12 | Discharge: 2020-06-13 | Payer: PRIVATE HEALTH INSURANCE

## 2020-06-12 DIAGNOSIS — K7460 Unspecified cirrhosis of liver: Secondary | ICD-10-CM | POA: Diagnosis not present

## 2020-06-12 DIAGNOSIS — K754 Autoimmune hepatitis: Secondary | ICD-10-CM | POA: Diagnosis not present

## 2020-06-12 DIAGNOSIS — Z780 Asymptomatic menopausal state: Secondary | ICD-10-CM | POA: Diagnosis not present

## 2020-06-12 MED ADMIN — gadobenate dimeglumine (MULTIHANCE) 529 mg/mL (0.1mmol/0.2mL) solution 7 mL: 7 mL | INTRAVENOUS | @ 20:00:00 | Stop: 2020-06-12

## 2020-06-12 MED FILL — MYCOPHENOLATE MOFETIL 500 MG TABLET: ORAL | 30 days supply | Qty: 120 | Fill #8

## 2020-06-12 MED FILL — MYCOPHENOLATE MOFETIL 500 MG TABLET: 30 days supply | Qty: 120 | Fill #8 | Status: AC

## 2020-06-13 NOTE — Unmapped (Signed)
[facility] Pharm[facility] Refill Coordination Note    Specialty Medication(s) to be Shipped:   Transplant: mycophenolate 500 mg     Other medication(s) to be shipped: No additional medications requested for fill at this time     [name], DOB: 1960-10-08  Phone: [phone] (home)       All above HIPAA information was verified with patient.     Was a Nurse, learning disability used for this call? No    Completed refill call assessment today to schedule patient's medication shipment from the [facility] [phone]).       Specialty medication(s) and dose(s) confirmed: Regimen is correct and unchanged.   Changes to medications: Lakisha reports no changes at this time.  Changes to insurance: No  Questions for the pharmacist: No    Confirmed patient received Welcome Packet with first shipment. The patient will receive a drug information handout for each medication shipped and additional FDA Medication Guides as required.       DISEASE/MEDICATION-SPECIFIC INFORMATION        N/A    SPECIALTY MEDICATION ADHERENCE     Medication Adherence    Patient reported X missed doses in the last month: 0  Specialty Medication: mycophenolate 500 mg   Patient is on additional specialty medications: No                Mycophenolate  500 mg: 10 days of medicine on hand          SHIPPING     Shipping address confirmed in Epic.     Delivery Scheduled: Yes, Expected medication delivery date: 06/20/20.     Medication will be delivered via Next Day Courier to the prescription address in Epic WAM.    Unk Lightning   [facility] Specialty Technician

## 2020-06-19 MED FILL — MYCOPHENOLATE MOFETIL 500 MG TABLET: ORAL | 30 days supply | Qty: 120 | Fill #9

## 2020-06-19 MED FILL — MYCOPHENOLATE MOFETIL 500 MG TABLET: 30 days supply | Qty: 120 | Fill #9 | Status: AC

## 2020-06-22 DIAGNOSIS — K754 Autoimmune hepatitis: Principal | ICD-10-CM

## 2020-06-22 DIAGNOSIS — Z79899 Other long term (current) drug therapy: Principal | ICD-10-CM

## 2020-06-22 NOTE — Unmapped (Signed)
F/u on email below from 12/6--Lab orders entered for patient. I contacted the patient to make her aware and also sent them via email and My Chart for her records. I requested in the order that the results be sent to Dr. Pablo Lawrence attention at [phone].  Lanora Manis, RN    From: [name] [email or web address]   Sent: Monday, June 18, 2020 9:18 AM  To: Lavonna Rua [email or web address]  Cc: Silvestre Moment [email or web address]  Subject: Durward Mallard,  Joyce Gross can have these labs done at [facility]:    CBC/diff  PT/INR  CMP  Direct bilirubin  AFP  Quant IgG    Please make sure I see the results when completed.  Thanks,  Cleone Slim, M.D., FAASLD  Professor of Medicine  Division of GI and Hepatology  [place] [facility] [place] at [facility]

## 2020-06-28 DIAGNOSIS — Z79899 Other long term (current) drug therapy: Secondary | ICD-10-CM | POA: Diagnosis not present

## 2020-06-28 DIAGNOSIS — K754 Autoimmune hepatitis: Secondary | ICD-10-CM | POA: Diagnosis not present

## 2020-06-29 LAB — PROTIME-INR
INR: 1 (ref 0.9–1.2)
PROTHROMBIN TIME: 10.3 s (ref 9.1–12.0)

## 2020-06-29 LAB — COMPREHENSIVE METABOLIC PANEL
A/G RATIO: 2.6 — ABNORMAL HIGH (ref 1.2–2.2)
ALBUMIN: 4.9 g/dL — ABNORMAL HIGH (ref 3.8–4.8)
ALKALINE PHOSPHATASE: 55 IU/L (ref 44–121)
ALT (SGPT): 20 IU/L (ref 0–32)
AST (SGOT): 26 IU/L (ref 0–40)
BILIRUBIN TOTAL: 0.3 mg/dL (ref 0.0–1.2)
BLOOD UREA NITROGEN: 12 mg/dL (ref 8–27)
BUN / CREAT RATIO: 16 (ref 12–28)
CALCIUM: 9.7 mg/dL (ref 8.7–10.3)
CHLORIDE: 103 mmol/L (ref 96–106)
CO2: 25 mmol/L (ref 20–29)
CREATININE: 0.74 mg/dL (ref 0.57–1.00)
GFR MDRD AF AMER: 101 mL/min/{1.73_m2}
GFR MDRD NON AF AMER: 88 mL/min/{1.73_m2}
GLOBULIN, TOTAL: 1.9 g/dL (ref 1.5–4.5)
GLUCOSE: 93 mg/dL (ref 65–99)
POTASSIUM: 4.2 mmol/L (ref 3.5–5.2)
SODIUM: 139 mmol/L (ref 134–144)
TOTAL PROTEIN: 6.8 g/dL (ref 6.0–8.5)

## 2020-06-29 LAB — CBC W/ DIFFERENTIAL
BANDED NEUTROPHILS ABSOLUTE COUNT: 0 10*3/uL (ref 0.0–0.1)
BASOPHILS ABSOLUTE COUNT: 0 10*3/uL (ref 0.0–0.2)
BASOPHILS RELATIVE PERCENT: 1 %
EOSINOPHILS ABSOLUTE COUNT: 0.1 10*3/uL (ref 0.0–0.4)
EOSINOPHILS RELATIVE PERCENT: 2 %
HEMATOCRIT: 38.5 % (ref 34.0–46.6)
HEMOGLOBIN: 12.9 g/dL (ref 11.1–15.9)
IMMATURE GRANULOCYTES: 0 %
LYMPHOCYTES ABSOLUTE COUNT: 1.8 10*3/uL (ref 0.7–3.1)
LYMPHOCYTES RELATIVE PERCENT: 28 %
MEAN CORPUSCULAR HEMOGLOBIN CONC: 33.5 g/dL (ref 31.5–35.7)
MEAN CORPUSCULAR HEMOGLOBIN: 31.2 pg (ref 26.6–33.0)
MEAN CORPUSCULAR VOLUME: 93 fL (ref 79–97)
MONOCYTES ABSOLUTE COUNT: 0.4 10*3/uL (ref 0.1–0.9)
MONOCYTES RELATIVE PERCENT: 7 %
NEUTROPHILS ABSOLUTE COUNT: 3.9 10*3/uL (ref 1.4–7.0)
NEUTROPHILS RELATIVE PERCENT: 62 %
PLATELET COUNT: 176 10*3/uL (ref 150–450)
RED BLOOD CELL COUNT: 4.13 x10E6/uL (ref 3.77–5.28)
RED CELL DISTRIBUTION WIDTH: 12.1 % (ref 11.7–15.4)
WHITE BLOOD CELL COUNT: 6.3 10*3/uL (ref 3.4–10.8)

## 2020-06-29 LAB — AFP TUMOR MARKER: AFP-TUMOR MARKER: 3.9 ng/mL (ref 0.0–8.3)

## 2020-06-29 LAB — IGG: IMMUNOGLOBULIN G, QN, SERUM: 643 mg/dL (ref 586–1602)

## 2020-06-29 LAB — BILIRUBIN, DIRECT: BILIRUBIN DIRECT: <0.1 mg/dL (ref 0.00–0.40)

## 2020-07-04 ENCOUNTER — Ambulatory Visit
Admit: 2020-07-04 | Discharge: 2020-07-04 | Payer: PRIVATE HEALTH INSURANCE | Attending: Gastroenterology | Primary: Gastroenterology

## 2020-07-04 DIAGNOSIS — K754 Autoimmune hepatitis: Secondary | ICD-10-CM | POA: Diagnosis not present

## 2020-07-04 DIAGNOSIS — Z791 Long term (current) use of non-steroidal anti-inflammatories (NSAID): Secondary | ICD-10-CM | POA: Diagnosis not present

## 2020-07-04 DIAGNOSIS — Z7952 Long term (current) use of systemic steroids: Secondary | ICD-10-CM | POA: Diagnosis not present

## 2020-07-04 DIAGNOSIS — Z79899 Other long term (current) drug therapy: Secondary | ICD-10-CM | POA: Diagnosis not present

## 2020-07-04 DIAGNOSIS — R509 Fever, unspecified: Secondary | ICD-10-CM | POA: Diagnosis not present

## 2020-07-04 DIAGNOSIS — K769 Liver disease, unspecified: Secondary | ICD-10-CM | POA: Diagnosis not present

## 2020-07-04 DIAGNOSIS — Z23 Encounter for immunization: Secondary | ICD-10-CM | POA: Diagnosis not present

## 2020-07-04 NOTE — Unmapped (Addendum)
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 MRI from last week was unchanged from prior. Labs also looked good. Return to office in 6 months with MRI same day. Check labs in 3 months at your convenience (send me MyChart message to order).

## 2020-07-07 ENCOUNTER — Encounter: Payer: Self-pay | Admitting: Internal Medicine

## 2020-07-07 DIAGNOSIS — U071 COVID-19: Secondary | ICD-10-CM

## 2020-07-07 HISTORY — DX: COVID-19: U07.1

## 2020-07-08 ENCOUNTER — Encounter
Admit: 2020-07-08 | Discharge: 2020-07-08 | Disposition: A | Discharge status: Home / Self Care | Payer: PRIVATE HEALTH INSURANCE

## 2020-07-08 DIAGNOSIS — R059 Cough, unspecified: Secondary | ICD-10-CM | POA: Diagnosis not present

## 2020-07-08 DIAGNOSIS — R509 Fever, unspecified: Secondary | ICD-10-CM | POA: Diagnosis not present

## 2020-07-08 DIAGNOSIS — R918 Other nonspecific abnormal finding of lung field: Secondary | ICD-10-CM | POA: Diagnosis not present

## 2020-07-08 DIAGNOSIS — U071 COVID-19: Secondary | ICD-10-CM | POA: Diagnosis not present

## 2020-07-08 DIAGNOSIS — R0600 Dyspnea, unspecified: Secondary | ICD-10-CM | POA: Diagnosis not present

## 2020-07-08 DIAGNOSIS — J029 Acute pharyngitis, unspecified: Secondary | ICD-10-CM | POA: Diagnosis not present

## 2020-07-08 LAB — CBC W/ AUTO DIFF
BASOPHILS ABSOLUTE COUNT: 0 10*9/L (ref 0.0–0.1)
BASOPHILS RELATIVE PERCENT: 0.2 %
EOSINOPHILS ABSOLUTE COUNT: 0 10*9/L (ref 0.0–0.4)
EOSINOPHILS RELATIVE PERCENT: 0.3 %
HEMATOCRIT: 43.3 % (ref 36.0–46.0)
HEMOGLOBIN: 14.1 g/dL (ref 12.0–16.0)
LARGE UNSTAINED CELLS: 2 % (ref 0–4)
LYMPHOCYTES ABSOLUTE COUNT: 0.8 10*9/L — ABNORMAL LOW (ref 1.5–5.0)
LYMPHOCYTES RELATIVE PERCENT: 13.9 %
MEAN CORPUSCULAR HEMOGLOBIN CONC: 32.5 g/dL (ref 31.0–37.0)
MEAN CORPUSCULAR HEMOGLOBIN: 31.8 pg (ref 26.0–34.0)
MEAN CORPUSCULAR VOLUME: 97.7 fL (ref 80.0–100.0)
MEAN PLATELET VOLUME: 8.6 fL (ref 7.0–10.0)
MONOCYTES ABSOLUTE COUNT: 0.4 10*9/L (ref 0.2–0.8)
MONOCYTES RELATIVE PERCENT: 6.8 %
NEUTROPHILS ABSOLUTE COUNT: 4.2 10*9/L (ref 2.0–7.5)
NEUTROPHILS RELATIVE PERCENT: 76.9 %
PLATELET COUNT: 147 10*9/L — ABNORMAL LOW (ref 150–440)
RED BLOOD CELL COUNT: 4.43 10*12/L (ref 4.00–5.20)
RED CELL DISTRIBUTION WIDTH: 13.2 % (ref 12.0–15.0)
WBC ADJUSTED: 5.5 10*9/L (ref 4.5–11.0)

## 2020-07-08 LAB — COMPREHENSIVE METABOLIC PANEL
ALBUMIN: 4.1 g/dL (ref 3.4–5.0)
ALKALINE PHOSPHATASE: 61 U/L (ref 46–116)
ALT (SGPT): 19 U/L (ref 10–49)
ANION GAP: 9 mmol/L (ref 5–14)
AST (SGOT): 25 U/L (ref <=34)
BILIRUBIN TOTAL: 0.5 mg/dL (ref 0.3–1.2)
BLOOD UREA NITROGEN: 6 mg/dL — ABNORMAL LOW (ref 9–23)
BUN / CREAT RATIO: 9
CALCIUM: 10 mg/dL (ref 8.7–10.4)
CHLORIDE: 102 mmol/L (ref 98–107)
CO2: 25 mmol/L (ref 20.0–31.0)
CREATININE: 0.67 mg/dL
EGFR CKD-EPI AA FEMALE: >90 mL/min/{1.73_m2} (ref >=60)
EGFR CKD-EPI NON-AA FEMALE: >90 mL/min/{1.73_m2} (ref >=60)
GLUCOSE RANDOM: 108 mg/dL (ref 70–179)
POTASSIUM: 3.9 mmol/L (ref 3.4–4.5)
PROTEIN TOTAL: 7.4 g/dL (ref 5.7–8.2)
SODIUM: 136 mmol/L (ref 135–145)

## 2020-07-08 LAB — HIGH SENSITIVITY TROPONIN I - SINGLE: HIGH SENSITIVITY TROPONIN I: <3 ng/L (ref <=34)

## 2020-07-08 MED ADMIN — casirivimab-imdevimab co-formulated 600 mg in sodium chloride (NS) 0.9 % 110 mL IVPB co-formulated: 600 mg | INTRAVENOUS | @ 20:00:00 | Stop: 2020-07-08

## 2020-07-08 MED ADMIN — acetaminophen (TYLENOL) tablet 1,000 mg: 1000 mg | ORAL | @ 18:00:00 | Stop: 2020-07-08

## 2020-07-08 MED ADMIN — ketorolac (TORADOL) injection 15 mg: 15 mg | INTRAVENOUS | @ 18:00:00 | Stop: 2020-07-08

## 2020-07-08 NOTE — Unmapped (Signed)
Pt states that she tested positive for covid and is having fever, cough, sore throat. Pt's md told her to come in since she is on cellcept

## 2020-07-08 NOTE — Unmapped (Signed)
[facility]    [name], M.D.  Professor of Medicine  Director, [facility]  [place] [facility] [place] at [place]    [phone]    [name]    Referring MD: Midge Minium, MD    Phone visit 06/08/19     Chief complaint: Office follow-up for elevation of liver enzymes. Hospitalization (08/2016) for acute hepatitis of uncertain etiology, presumed autoimmune hepatitis treated with prednisone and mycophenylate. DRUG FEVER ASSOCIATED WITH AZATHIOPRINE. STEROIDS STOPPED 07/2017. CONTINUES ON CELLCEPT 1 GRAM BID FOR IMMUNOSUPPRESSION    Interval history: Patient continues to feel well. She denies fever, cough, sob, N/V, abdominal pain, chest pain, or skin rash.  She has been maintained on mycophenylate 1gram bid. No new health related concerns. She has received COVID vaccine in [place] x 2 and also the booster dose.   ADDENDUM: PATIENT CALLED ME ON 06/2620. ON 07/06/20 SHE STARTED TO HAVE MALAISE. 12/25 DEVELOPED FEVER, MYALGIA, AND COUGH WHICH HAS PROGRESSIVELY WORSENED THIS MORNING. I HAVE COMMUNICATED WITH DR. Garnet Koyanagi, ID ATTENDING, AND ALSO ER CHARGE NURSE, ABBEY. PATIENT WILL GO TO [facility] IMMEDIATELY TO BE CONSIDERED FOR ANTIBODY THERAPY AND POSSIBLY ANTIVIRAL THERAPY IN LIGHT OF HER IMMUNOCOMPROMISED STATE DUE TO CELLCEPT THERAPY.      Present illness at presentation:  Patient is a 59 y.o. Caucasian female who was hospitalized at [facility] between approximately February 12 -  August 29, 2016 with acute hepatitis. The patient had about 10 weeks of abnormal liver tests documented by her primary physician and her primary gastroenterologist. This was preceded by a short flu-like illness characterized by fatigue, muscle aches, and low-grade fever. All of the symptoms have completely remained resolved since her  Visit 09/10/2016. However she was left with elevated liver tests and had an extensive workup as an outpatient and subsequently as an inpatient at [facility] to further define the etiology of her liver disease.  Additional details of her history and prior outpatient evaluation as well as her inpatient evaluation are available from our initial consultation note and follow-up notes while she was hospitalized.       After review of the liver biopsy and all serologic data thus far which has been unrevealing, it was felt that a therapeutic trial of prednisone for presumed seronegative autoimmune hepatitis was warranted. Prednisone  40 mg of daily started on 08/29/16. She had a good biochemical response with marked improvement in ALT, INR, including a slow but steady decrease in Total/direct bilirubin.     Patient had an extensive serological evaluation which was not revealing:  1. Antinuclear antibody and anti-smooth muscle antibody were negative. Immunoglobulin G was not elevated (1061)  2. Viral serologies including acute hepatitis A, hepatitis B, hepatitis C, HCV RNA, EBV PCR, CMV PCR, HHV-6 were all negative.  3. Liver ultrasound demonstrated patent vasculature with a nodular-appearing heterogeneous liver.  4. Percutaneous liver biopsy 08/2016 at [facility]:    A: Liver, core biopsy   - Severe active hepatitis with confluent areas of periportal hepatocellular necrosis (approximately 10-20% of sampled parenchyma) (see comment)   - Trichrome stain demonstrates at least periportal fibrosis and is suspicious for bridging fibrosis     Potential etiologies to consider include, but are not limited to, infection, drug/toxin-induced liver injury, seronegative autoimmune hepatitis, and metabolic disorders. The trichrome stain findings suggest that this process is at least subacute in duration.      Liver biopsy slides were personally reviewed by Dr. Sharon Mt  with the pathologist.  No viral inclusions were noted and only few plasma cells were visualized in the liver biopsy which was an adequate specimen. No cytopathic effect. EBV immunostains were negative. Fe in hepatocytes only grade 1/4    5.  Evaluation for Zoster: Dr. Foy Guadalajara received phone call from patient's primary GI MD that patient's Zoster IgM from 09/05/2016 was positive with titer 1.17 approximately one week after starting prednisone. Review of her record though revealed this test to have been negative on 08/29/2016.  Dr. Sharon Mt discussed these findings with Dr. Lytle Butte and the decision was made to proceed with patient starting Valtrex 500 mg bid.  She was seen by Dr. 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   Recommendations from ID: Continue valacyclovir 500 mg bid.  Advised readdressing stopping Valtrex if her repeat IgM is negative or once she is on <15 mg prednisone daily.  Valtrex was discontinued last week.  Bactrim prophylaxis while prednisone >15 mg q 24. 1 DS tablet M/W/F. The following vaccines were recommended for her to obtain under the care of her PCP: Twinrix, PCV13, and PPSV23. She should have these performed by her PCP when prednisone dose is  <20 mg daily.     Immunosuppressive history:  Patient was started on azathioprine for steroid sparing benefits in early April.  After 2 weeks patient developed fevers. ID workup negative during hospitalization.  Azathioprine was held during that hospitalization and fevers resolved. When she restarted azathioprine as outpatient, immediately had high fevers and shaking chills.  This rechallenge was highly suggestive of drug fever from azathioprine.    On 5/12 patient was started on CellCept 500mg  bid and continued on prednisone 15 mg daily.  On 12/03/16, liver enzymes remained elevated. Cellcept was increased to 1000mg  BID on 12/04/16. Prednisone tapered to off as of 07/2017.     10 sys ROS otherwise negative.     Past medical history:  Patient has generally been healthy until the current hepatitis began in December 2017.  1. No history of diabetes, coronary artery disease, hypertension, or lung disease.  2. No history of other autoimmune systemic diseases  3. FIBROSCAN 11/05/16: 17.7 kps c/w stage F4 (Patient ate breakfast 3 hours prior).  4. MRI 10/2017: HEPATOBILIARY: Nodular liver contour compatible with history of cirrhosis and similar to prior. Again seen is a geographic region of T1 hypointensity and corresponding slight T2 hyperintensity in the posterior right hepatic lobe which is overall similar in extent when compared to prior imaging. This region demonstrates progressive postcontrast enhancement as before. Overall similar degree of capsular retraction most pronounced along the right anterior margin of the liver (13:29). No discrete early arterially enhancing or washout liver lesion identified. No biliary ductal dilatation. Similar appearance and position of the borderline hydropic gallbladder.  PANCREAS: Pancreas divisum. No ductal dilatation.    5. MRI 06/2020: Morphologic features consistent with history of cirrhosis. No sequela of portal hypertension.No MR evidence of HCC.  Similar appearing probable confluent fibrosis in the right hepatic lobe.     Allergies   Allergen Reactions   ??? Azathioprine Other (See Comments)     Fever  Other reaction(s): Other (See Comments)  Fever       Current Outpatient Medications   Medication Sig Dispense Refill   ??? ibuprofen (MOTRIN) 600 MG tablet Take 600 mg by mouth every six (6) hours as needed.     ??? mycophenolate (CELLCEPT) 500 mg tablet Take 2  tablets (1,000 mg total) by mouth 2 (two) times a day with meals. 360 tablet 3   ??? ketoconazole (NIZORAL) 2 % cream Apply 1 application topically Two (2) times a day. To affected areas as directed (Patient not taking: Reported on 01/25/2020) 60 g 1     No current facility-administered medications for this visit.     Social history: The patient is married. She works as a Materials engineer. They have traveled extensively.   She does not drink any alcohol. She does not smoke cigarettes.    Family history: Negative for liver disease or liver cancer. Negative for autoimmune systemic diseases.    BP 139/87  - Pulse 70  - Temp 35.9 ??C (96.7 ??F) (Temporal)  - Resp 16  - Wt 70 kg (154 lb 6.4 oz)  - SpO2 99%  - BMI 24.92 kg/m??     Pleasant individual in NAD    HEENT: Sclera are anicteric, no temporal muscle loss  NECK: No thyromegaly or lymphadenopathy, No carotid bruits  Chest: Clear to auscultation and percussion  Heart: S1, S2, RR, No murmurs  Abdomen: Soft, non-tender, non-distended, no hepatosplenomegaly, no masses appreciated, no ascites  Skin: No spider angiomata, No rashes  Extremities: Without pedal edema, no palmar erythema  Neuro: Grossly intact, No focal deficits      Results for orders placed or performed in visit on 06/22/20   CBC w/ Differential   Result Value Ref Range    WBC 6.3 3.4 - 10.8 x10E3/uL    RBC 4.13 3.77 - 5.28 x10E6/uL    HGB 12.9 11.1 - 15.9 g/dL    HCT 16.1 09.6 - 04.5 %    MCV 93 79 - 97 fL    MCH 31.2 26.6 - 33.0 pg    MCHC 33.5 31.5 - 35.7 g/dL    RDW 40.9 81.1 - 91.4 %    Platelet 176 150 - 450 x10E3/uL    Neutrophils % 62 Not Estab. %    Lymphocytes % 28 Not Estab. %    Monocytes % 7 Not Estab. %    Eosinophils % 2 Not Estab. %    Basophils % 1 Not Estab. %    Absolute Neutrophils 3.9 1.4 - 7.0 x10E3/uL    Absolute Lymphocytes 1.8 0.7 - 3.1 x10E3/uL    Absolute Monocytes  0.4 0.1 - 0.9 x10E3/uL    Absolute Eosinophils 0.1 0.0 - 0.4 x10E3/uL    Absolute Basophils  0.0 0.0 - 0.2 x10E3/uL    Immature Granulocytes 0 Not Estab. %    Bands Absolute 0.0 0.0 - 0.1 x10E3/uL   PT-INR   Result Value Ref Range    INR 1.0 0.9 - 1.2    Prothrombin Time 10.3 9.1 - 12.0 sec   Comprehensive metabolic panel   Result Value Ref Range    Glucose 93 65 - 99 mg/dL    BUN 12 8 - 27 mg/dL    Creatinine 7.82 9.56 - 1.00 mg/dL    GFR MDRD Non Af Amer 88 >59 mL/min/1.73    GFR MDRD Af Amer 101 >59 mL/min/1.73    BUN/Creatinine Ratio 16 12 - 28    Sodium 139 134 - 144 mmol/L    Potassium 4.2 3.5 - 5.2 mmol/L    Chloride 103 96 - 106 mmol/L    CO2 25 20 - 29 mmol/L    Calcium 9.7 8.7 - 10.3 mg/dL    Total Protein 6.8 6.0 - 8.5 g/dL    Albumin  4.9 (H) 3.8 - 4.8 g/dL    Globulin, Total 1.9 1.5 - 4.5 g/dL    A/G Ratio 2.6 (H) 1.2 - 2.2    Total Bilirubin 0.3 0.0 - 1.2 mg/dL    Alkaline Phosphatase 55 44 - 121 IU/L    AST 26 0 - 40 IU/L    ALT 20 0 - 32 IU/L   Bilirubin, Direct   Result Value Ref Range    Bilirubin, Direct <0.10 0.00 - 0.40 mg/dL   AFP non-maternal tumor marker   Result Value Ref Range    AFP-Tumor Marker 3.9 0.0 - 8.3 ng/mL   IgG   Result Value Ref Range    Immunoglobulin G, Qn, Serum 643 586 - 1,602 mg/dL     MELD-Na score: 6 at 06/28/2020  2:35 PM  MELD score: 6 at 06/28/2020  2:35 PM  Calculated from:  Serum Creatinine: 0.74 mg/dL (Using min of 1 mg/dL) at 72/53/6644  0:34 PM  Serum Sodium: 139 mmol/L (Using max of 137 mmol/L) at 06/28/2020  2:35 PM  Total Bilirubin: 0.3 mg/dL (Using min of 1 mg/dL) at 74/25/9563  8:75 PM  INR(ratio): 1.0 at 06/28/2020  2:35 PM  Age: 64 years      Impression:  1. Seronegative autoimmune hepatitis based on response to immunosuppressive medication. The patient presented with severe hepatitis as evidenced by hyperbilirubinemia, elevation of her INR, decreased albumin, and results from her liver biopsy. Patient was started on a therapeutic trial of prednisone for the reasons discussed above. After only 4 days of immunosuppression, her liver enzymes (ALT)  that had been regularly elevated above 1000 decreased substantially.  This was highly suggestive of a positive effect of the prednisone and was c/w  presumed ANA-negative/SMA-negative autoimmune hepatitis.Labs showed continued normalization of bilirubin and INR, although she remained with mildly elevated ALT in the 40-45 range. CellCept was added on 5/12 and increased to 1000mg  BID on 12/04/16.Steroids tapered to off as of 07/2017.    We will maintain current level of CellCept.  ALT normal.  Patient feels completely well and labs stable.     2. Cirrhosis, well compensated: FIBROSCAN suggestive of cirrhosis and MRI suggestive of nodular contour with focal fibrosis.  Liver biopsy during acute episode did demonstrate possible bridging fibrosis. Inflammation now resolved.Defer repeat liver biopsy but may reconsider after prolonged remission to reevaluate, as will not change management at this point.     3. HCC surveillance: Findings of irrhosis as described above. 06/2020-MRI with evidence of cirrhosis. Unchanged area of focal fibrosis.  No evidence of enhancing mass. Unchanged from prior. Repeat in 6 months at next visit      4. Drug fevers from Azathioprine: resolved, no recurrent fever.    5. Mild thrombocytopenia: Resolved. Normal size spleen.     4. Twinrix x 3 completed.     5. . Vaccinations: Completed Shingrix. She will get pneumovax at PCP.     6. Lymphopenia: Resolved.    7. Incidental adnexal cyst seen on MRI: Benign after excision.     8. Vaccinated for COVID x 2 and boosted.      Labs in 3 months with RTC 6 months. MRI on day of next visit.      [name], M.D.  Professor of Medicine  Director, [facility]  [place] [facility] [place] at [place]    [phone]

## 2020-07-10 DIAGNOSIS — K754 Autoimmune hepatitis: Principal | ICD-10-CM

## 2020-07-10 NOTE — Unmapped (Signed)
[facility]  Emergency Department Provider Note        ED Clinical Impression      Final diagnoses:   COVID-19 (Primary)           Impression, ED Course, Assessment and Plan      Impression: Margaret Berger is a 59 y.o. female with PMHx of autoimmune hepatitis (on cellcept) and recent COVID-19 diagnosis presenting for monoclonal antibody infusion. Patient noted fever, cough and sore throat beginning 2 days ago with positive COVID test. Patient is vaccinated with booster against COVID-19.     Vitals within normal limits. No hypoxia. Patient is overall well-appearing, non-toxic. Physical exam unremarkable.     Patient does qualify for monoclonal antibody infusion based on her history of hepatitis. She has been consented verbally and will receive infusion. No hypoxia, increased work of breathing to warrant admission for COVID-19.     Will plan for discharge after infusion.     ED Course:      1400: Infusion complete, will discharge.        Additional Medical Decision Making     I have reviewed the vital signs and the nursing notes. Labs and radiology results that were available during my care of the patient were independently reviewed by me and considered in my medical decision making. I discussed the case with Dr. Magdalene Molly, ED Attending.      History        Chief Complaint  Flu Like Symptoms      HPI   Margaret Berger is a 59 y.o. female with PMHx of autoimmune hepatitis (on cellcept) and recent COVID-19 diagnosis presenting for monoclonal antibody infusion. Patient noted fever, cough and sore throat beginning 2 days ago with positive COVID test. Patient's son has COVID-19. Patient is vaccinated with booster against COVID-19. She was told to present to ED by hepatologist given that she is high risk. Denies nausea, vomiting, diarrhea, abd pain, LE edema.      No past medical history on file.    Patient Active Problem List   Diagnosis   ??? Elevated liver function tests   ??? Fatigue   ??? Abdominal bloating   ??? Fever   ??? Hepatitis       Past Surgical History:   Procedure Laterality Date   ??? FRACTURE SURGERY     ??? JOINT REPLACEMENT         No current facility-administered medications for this encounter.    Current Outpatient Medications:   ???  ibuprofen (MOTRIN) 600 MG tablet, Take 600 mg by mouth every six (6) hours as needed., Disp: , Rfl:   ???  ketoconazole (NIZORAL) 2 % cream, Apply 1 application topically Two (2) times a day. To affected areas as directed (Patient not taking: Reported on 01/25/2020), Disp: 60 g, Rfl: 1  ???  mycophenolate (CELLCEPT) 500 mg tablet, Take 2 tablets (1,000 mg total) by mouth 2 (two) times a day with meals., Disp: 360 tablet, Rfl: 3    Allergies  Azathioprine    Family History   Problem Relation Age of Onset   ??? Melanoma Neg Hx    ??? Basal cell carcinoma Neg Hx    ??? Squamous cell carcinoma Neg Hx        Social History  Social History     Tobacco Use   ??? Smoking status: Never Smoker   ??? Smokeless tobacco: Never Used   Substance Use Topics   ??? Alcohol use: No  Alcohol/week: 0.0 standard drinks     Comment: rarely/socially   ??? Drug use: No       Review of Systems  Constitutional: Positive for fevers  Eyes: Negative for visual changes.  ENT: Positive for sore throat.  Cardiovascular: Negative for chest pain.  Respiratory: Positive for cough; Negative for shortness of breath.  Gastrointestinal: Negative for nausea, vomiting, diarrhea  Genitourinary: Negative for dysuria.   Musculoskeletal: Negative for back pain.  Skin: Negative for rash.  Neurological: Negative for headaches, focal weakness or numbness.       Physical Exam     ED Triage Vitals   Enc Vitals Group      BP 07/08/20 1129 147/77      Heart Rate 07/08/20 1129 95      SpO2 Pulse 07/08/20 1430 73      Resp 07/08/20 1129 18      Temp 07/08/20 1129 37.1 ??C (98.8 ??F)      Temp Source 07/08/20 1430 Oral      SpO2 07/08/20 1129 100 %      Weight --       Height --       Head Circumference --       Peak Flow --       Pain Score --       Pain Loc -- Pain Edu? --       Excl. in GC? --        Constitutional: well-appearing, NAD  Eyes: Conjunctivae are normal.  ENT       Head: Normocephalic and atraumatic.       Nose: No congestion.       Mouth/Throat: Mucous membranes are moist.       Neck: No stridor.  Hematological/Lymphatic/Immunilogical: No cervical lymphadenopathy.  Cardiovascular: RRR, no murmurs; normal and symmetric distal pulses are present in all extremities.  Respiratory: Lungs clear to ascultation bilaterally, no wheezes, crackles, rales; no increased work of breathing.   Gastrointestinal: Soft, non-distended, non-tender; no rebound tenderness or guarding.   Musculoskeletal: Normal range of motion in all extremities. No lower extremity edema or tenderness.   Neurologic: Cranial nerves II-XII intact. Normal speech and language. No gross focal neurologic deficits are appreciated.  Skin: Skin is warm, dry and intact. No rash noted.  Psychiatric: Mood and affect are normal. Speech and behavior are normal.       Radiology     XR Chest Portable   Final Result      Left lower lung zone opacity is nonspecific, though could represent atelectasis versus infection.               Pertinent labs & imaging results that were available during my care of the patient were reviewed by me and considered in my medical decision making (see chart for details).     Please note- This chart has been created using [employer]. Chart creation errors have been sought, but may not always be located and such creation errors, especially pronoun confusion, do NOT reflect on the standard of medical care.           Clancy Gourd, MD  Resident  07/10/20 [phone]

## 2020-07-11 DIAGNOSIS — K754 Autoimmune hepatitis: Secondary | ICD-10-CM | POA: Diagnosis not present

## 2020-07-11 NOTE — Unmapped (Signed)
Texted with patient to get updated status after her COVID diagnosis. She is feeling much better after receiving antibody treatment at [facility] -less cough.  Recheck labs at [facility] on Wednesday-this message used to place orders.  Reevaluate when to restart CellCept.     [name], M.D.  Professor of Medicine  Director, [facility]  [place] [facility] [place] at [place]    [phone]

## 2020-07-12 LAB — COMPREHENSIVE METABOLIC PANEL
A/G RATIO: 2.5 — ABNORMAL HIGH (ref 1.2–2.2)
ALBUMIN: 5 g/dL — ABNORMAL HIGH (ref 3.8–4.8)
ALKALINE PHOSPHATASE: 57 IU/L (ref 44–121)
ALT (SGPT): 17 IU/L (ref 0–32)
AST (SGOT): 19 IU/L (ref 0–40)
BILIRUBIN TOTAL: 0.3 mg/dL (ref 0.0–1.2)
BLOOD UREA NITROGEN: 12 mg/dL (ref 8–27)
BUN / CREAT RATIO: 10 — ABNORMAL LOW (ref 12–28)
CALCIUM: 9.8 mg/dL (ref 8.7–10.3)
CHLORIDE: 99 mmol/L (ref 96–106)
CO2: 24 mmol/L (ref 20–29)
CREATININE: 1.18 mg/dL — ABNORMAL HIGH (ref 0.57–1.00)
GFR MDRD AF AMER: 58 mL/min/{1.73_m2} — ABNORMAL LOW
GFR MDRD NON AF AMER: 50 mL/min/{1.73_m2} — ABNORMAL LOW
GLOBULIN, TOTAL: 2 g/dL (ref 1.5–4.5)
GLUCOSE: 92 mg/dL (ref 65–99)
POTASSIUM: 4.2 mmol/L (ref 3.5–5.2)
SODIUM: 139 mmol/L (ref 134–144)
TOTAL PROTEIN: 7 g/dL (ref 6.0–8.5)

## 2020-07-12 LAB — CBC W/ DIFFERENTIAL
BANDED NEUTROPHILS ABSOLUTE COUNT: 0 10*3/uL (ref 0.0–0.1)
BASOPHILS ABSOLUTE COUNT: 0 10*3/uL (ref 0.0–0.2)
BASOPHILS RELATIVE PERCENT: 0 %
EOSINOPHILS ABSOLUTE COUNT: 0.1 10*3/uL (ref 0.0–0.4)
EOSINOPHILS RELATIVE PERCENT: 2 %
HEMATOCRIT: 37.8 % (ref 34.0–46.6)
HEMOGLOBIN: 13.1 g/dL (ref 11.1–15.9)
IMMATURE GRANULOCYTES: 0 %
LYMPHOCYTES ABSOLUTE COUNT: 1.6 10*3/uL (ref 0.7–3.1)
LYMPHOCYTES RELATIVE PERCENT: 32 %
MEAN CORPUSCULAR HEMOGLOBIN CONC: 34.7 g/dL (ref 31.5–35.7)
MEAN CORPUSCULAR HEMOGLOBIN: 32.1 pg (ref 26.6–33.0)
MEAN CORPUSCULAR VOLUME: 93 fL (ref 79–97)
MONOCYTES ABSOLUTE COUNT: 0.5 10*3/uL (ref 0.1–0.9)
MONOCYTES RELATIVE PERCENT: 9 %
NEUTROPHILS ABSOLUTE COUNT: 2.9 10*3/uL (ref 1.4–7.0)
NEUTROPHILS RELATIVE PERCENT: 57 %
PLATELET COUNT: 166 10*3/uL (ref 150–450)
RED BLOOD CELL COUNT: 4.08 x10E6/uL (ref 3.77–5.28)
RED CELL DISTRIBUTION WIDTH: 12 % (ref 11.7–15.4)
WHITE BLOOD CELL COUNT: 5.2 10*3/uL (ref 3.4–10.8)

## 2020-07-14 DIAGNOSIS — K754 Autoimmune hepatitis: Principal | ICD-10-CM

## 2020-07-14 NOTE — Unmapped (Signed)
Spoke to patient on Thursday 07/12/2020. Patient feeling much better after COVID antibody infusion at [facility]. Less cough, afebrile x 48 hours without antipyretics. No SOB. Labs reviewed. LFTs normal. Lymphocyte count improved. Patient will restart CellCept at 500mg  BID on Friday 07/13/2020. Repeat labs at [facility] on 07/16/2020 ordered. Assuming continued clinical improvement and stable labs will return to prior dose of CellCept for management of cirrhosis from autoimmune hepatitis.    [name], M.D.  Professor of Medicine  Director, [facility]  [place] [facility] [place] at [place]    [phone]

## 2020-07-16 DIAGNOSIS — K754 Autoimmune hepatitis: Secondary | ICD-10-CM | POA: Diagnosis not present

## 2020-07-17 LAB — COMPREHENSIVE METABOLIC PANEL
A/G RATIO: 1.9 (ref 1.2–2.2)
ALBUMIN: 4.9 g/dL — ABNORMAL HIGH (ref 3.8–4.8)
ALKALINE PHOSPHATASE: 61 IU/L (ref 44–121)
ALT (SGPT): 18 IU/L (ref 0–32)
AST (SGOT): 20 IU/L (ref 0–40)
BILIRUBIN TOTAL: 0.4 mg/dL (ref 0.0–1.2)
BLOOD UREA NITROGEN: 11 mg/dL (ref 8–27)
BUN / CREAT RATIO: 14 (ref 12–28)
CALCIUM: 10.3 mg/dL (ref 8.7–10.3)
CHLORIDE: 98 mmol/L (ref 96–106)
CO2: 22 mmol/L (ref 20–29)
CREATININE: 0.76 mg/dL (ref 0.57–1.00)
GFR MDRD AF AMER: 98 mL/min/{1.73_m2}
GFR MDRD NON AF AMER: 85 mL/min/{1.73_m2}
GLOBULIN, TOTAL: 2.6 g/dL (ref 1.5–4.5)
GLUCOSE: 103 mg/dL — ABNORMAL HIGH (ref 65–99)
POTASSIUM: 4.7 mmol/L (ref 3.5–5.2)
SODIUM: 136 mmol/L (ref 134–144)
TOTAL PROTEIN: 7.5 g/dL (ref 6.0–8.5)

## 2020-07-17 LAB — CBC W/ DIFFERENTIAL
BANDED NEUTROPHILS ABSOLUTE COUNT: 0 10*3/uL (ref 0.0–0.1)
BASOPHILS ABSOLUTE COUNT: 0 10*3/uL (ref 0.0–0.2)
BASOPHILS RELATIVE PERCENT: 1 %
EOSINOPHILS ABSOLUTE COUNT: 0.2 10*3/uL (ref 0.0–0.4)
EOSINOPHILS RELATIVE PERCENT: 4 %
HEMATOCRIT: 43.5 % (ref 34.0–46.6)
HEMOGLOBIN: 14.4 g/dL (ref 11.1–15.9)
IMMATURE GRANULOCYTES: 0 %
LYMPHOCYTES ABSOLUTE COUNT: 1.7 10*3/uL (ref 0.7–3.1)
LYMPHOCYTES RELATIVE PERCENT: 32 %
MEAN CORPUSCULAR HEMOGLOBIN CONC: 33.1 g/dL (ref 31.5–35.7)
MEAN CORPUSCULAR HEMOGLOBIN: 30.7 pg (ref 26.6–33.0)
MEAN CORPUSCULAR VOLUME: 93 fL (ref 79–97)
MONOCYTES ABSOLUTE COUNT: 0.5 10*3/uL (ref 0.1–0.9)
MONOCYTES RELATIVE PERCENT: 10 %
NEUTROPHILS ABSOLUTE COUNT: 2.8 10*3/uL (ref 1.4–7.0)
NEUTROPHILS RELATIVE PERCENT: 53 %
PLATELET COUNT: 253 10*3/uL (ref 150–450)
RED BLOOD CELL COUNT: 4.69 x10E6/uL (ref 3.77–5.28)
RED CELL DISTRIBUTION WIDTH: 12.1 % (ref 11.7–15.4)
WHITE BLOOD CELL COUNT: 5.3 10*3/uL (ref 3.4–10.8)

## 2020-07-31 MED FILL — MYCOPHENOLATE MOFETIL 500 MG TABLET: ORAL | 30 days supply | Qty: 120 | Fill #10

## 2020-07-31 NOTE — Unmapped (Signed)
[facility] Clinical Assessment & Refill Coordination Note    Margaret Berger, DOB: 1961-03-22  Phone: [phone] (home)     All above HIPAA information was verified with patient.     Was a Nurse, learning disability used for this call? No    Specialty Medication(s):   Inflammatory Disorders: mycophenolate     Current Outpatient Medications   Medication Sig Dispense Refill   ??? ibuprofen (MOTRIN) 600 MG tablet Take 600 mg by mouth every six (6) hours as needed.     ??? ketoconazole (NIZORAL) 2 % cream Apply 1 application topically Two (2) times a day. To affected areas as directed (Patient not taking: Reported on 01/25/2020) 60 g 1   ??? mycophenolate (CELLCEPT) 500 mg tablet Take 2 tablets (1,000 mg total) by mouth 2 (two) times a day with meals. 360 tablet 3     No current facility-administered medications for this visit.        Changes to medications: Daanya reports no changes at this time.    Allergies   Allergen Reactions   ??? Azathioprine Other (See Comments)     Fever  Other reaction(s): Other (See Comments)  Fever       Changes to allergies: No    SPECIALTY MEDICATION ADHERENCE     mycophenolate 500 mg: 5 days of medicine on hand     Medication Adherence    Patient reported X missed doses in the last month: 6  Specialty Medication: mycophenolate 500mg : Missed 3 days due to COVID as instructed by her provider  Patient is on additional specialty medications: No  Patient is on more than two specialty medications: No  Any gaps in refill history greater than 2 weeks in the last 3 months: no  Demonstrates understanding of importance of adherence: yes  Informant: patient  Provider-estimated medication adherence level: good  Patient is at risk for Non-Adherence: No          Specialty medication(s) dose(s) confirmed: Regimen is correct and unchanged.     Are there any concerns with adherence? No    Adherence counseling provided? Not needed    CLINICAL MANAGEMENT AND INTERVENTION      Clinical Benefit Assessment:    Do you feel the medicine is effective or helping your condition? Yes    Clinical Benefit counseling provided? Not needed    Adverse Effects Assessment:    Are you experiencing any side effects? No    Are you experiencing difficulty administering your medicine? No    Quality of Life Assessment:    How many days over the past month did your autoimmune hepatitis   keep you from your normal activities? For example, brushing your teeth or getting up in the morning. 0    Have you discussed this with your provider? Not needed    Therapy Appropriateness:    Is therapy appropriate? Yes, therapy is appropriate and should be continued    DISEASE/MEDICATION-SPECIFIC INFORMATION      N/A    PATIENT SPECIFIC NEEDS     - Does the patient have any physical, cognitive, or cultural barriers? No    - Is the patient high risk? Yes, patient is taking a REMS drug. Medication is dispensed in compliance with REMS program    - Does the patient require a Care Management Plan? No     - Does the patient require physician intervention or other additional services (i.e. nutrition, smoking cessation, social work)? No  SHIPPING     Specialty Medication(s) to be Shipped:   Inflammatory Disorders: mycophenolate    Other medication(s) to be shipped: No additional medications requested for fill at this time     Changes to insurance: Yes: I entered new information as given to me by the patient    Delivery Scheduled: Yes, Expected medication delivery date: 07/31/20.     Medication will be delivered via Same Day Courier to the confirmed prescription address in [facility].    The patient will receive a drug information handout for each medication shipped and additional FDA Medication Guides as required.  Verified that patient has previously received a Conservation officer, historic buildings.    All of the patient's questions and concerns have been addressed.    Roderic Palau   [facility] Specialty Pharmacist

## 2020-08-28 NOTE — Unmapped (Signed)
[facility] Refill Coordination Note    Specialty Medication(s) to be Shipped:   Infectious Disease: Mycophenolate    Other medication(s) to be shipped: No additional medications requested for fill at this time     [name], DOB: [birth year]  Phone: [phone] (home)       All above HIPAA information was verified with patient.     Was a Nurse, learning disability used for this call? No    Completed refill call assessment today to schedule patient's medication shipment from the [facility] [phone]).       Specialty medication(s) and dose(s) confirmed: Regimen is correct and unchanged.   Changes to medications: Cathe reports no changes at this time.  Changes to insurance: No  Questions for the pharmacist: No    Confirmed patient received Welcome Packet with first shipment. The patient will receive a drug information handout for each medication shipped and additional FDA Medication Guides as required.       DISEASE/MEDICATION-SPECIFIC INFORMATION        N/A    SPECIALTY MEDICATION ADHERENCE     Medication Adherence    Patient reported X missed doses in the last month: 0  Specialty Medication: mycophenolate 250mg                 Mycophenolate 500mg   : 8 days of medicine on hand         SHIPPING     Shipping address confirmed in Epic.     Delivery Scheduled: Yes, Expected medication delivery date: 2/22.     Medication will be delivered via UPS to the prescription address in Epic W[facility].    [name]   [facility] Specialty Technician

## 2020-09-03 MED FILL — MYCOPHENOLATE MOFETIL 500 MG TABLET: ORAL | 30 days supply | Qty: 120 | Fill #11

## 2020-09-12 LAB — BASIC METABOLIC PANEL
BUN: 20 (ref 4–21)
CO2: 22 (ref 13–22)
Chloride: 100 (ref 99–108)
Creatinine: 0.9 (ref 0.5–1.1)
Glucose: 94
Potassium: 4.7 (ref 3.4–5.3)
Sodium: 141 (ref 137–147)

## 2020-09-12 LAB — HEPATIC FUNCTION PANEL
ALT: 17 (ref 7–35)
AST: 23 (ref 13–35)
Alkaline Phosphatase: 59 (ref 25–125)
Bilirubin, Total: 0.5

## 2020-09-12 LAB — CBC AND DIFFERENTIAL
Hemoglobin: 13.1 (ref 12.0–16.0)
Platelets: 186 (ref 150–399)
WBC: 7.2

## 2020-09-12 LAB — COMPREHENSIVE METABOLIC PANEL: Albumin: 4.9 (ref 3.5–5.0)

## 2020-09-26 DIAGNOSIS — K754 Autoimmune hepatitis: Principal | ICD-10-CM

## 2020-09-26 MED ORDER — MYCOPHENOLATE MOFETIL 500 MG TABLET
ORAL_TABLET | Freq: Two times a day (BID) | ORAL | 3 refills | 90 days
Start: 2020-09-26 — End: 2021-09-26

## 2020-09-27 DIAGNOSIS — K754 Autoimmune hepatitis: Principal | ICD-10-CM

## 2020-09-27 MED ORDER — MYCOPHENOLATE MOFETIL 500 MG TABLET
ORAL_TABLET | Freq: Two times a day (BID) | ORAL | 3 refills | 90 days | Status: CP
Start: 2020-09-27 — End: 2021-09-27
  Filled 2020-10-01: qty 120, 30d supply, fill #0

## 2020-09-27 NOTE — Unmapped (Signed)
In basket message for labs and MRI scheduling

## 2020-09-28 NOTE — Unmapped (Signed)
[facility] Refill Coordination Note    Specialty Medication(s) to be Shipped:   Infectious Disease: Mycophenolate    Other medication(s) to be shipped: No additional medications requested for fill at this time     Margaret Berger, DOB: 1961-03-26  Phone: [phone] (home)       All above HIPAA information was verified with patient.     Was a Nurse, learning disability used for this call? No    Completed refill call assessment today to schedule patient's medication shipment from the [facility] [phone]).       Specialty medication(s) and dose(s) confirmed: Regimen is correct and unchanged.   Changes to medications: Alahna reports no changes at this time.  Changes to insurance: No  Questions for the pharmacist: No    Confirmed patient received Welcome Packet with first shipment. The patient will receive a drug information handout for each medication shipped and additional FDA Medication Guides as required.       DISEASE/MEDICATION-SPECIFIC INFORMATION        N/A    SPECIALTY MEDICATION ADHERENCE     Medication Adherence    Patient reported X missed doses in the last month: 0  Specialty Medication: mycophenolate 500mg                 Mycophenolate 500mg   : 10 days of medicine on hand         SHIPPING     Shipping address confirmed in Epic.     Delivery Scheduled: Yes, Expected medication delivery date: 3/22.     Medication will be delivered via UPS to the prescription address in Epic WAM.    Margaret Berger   [facility] Specialty Technician

## 2020-10-01 DIAGNOSIS — K754 Autoimmune hepatitis: Secondary | ICD-10-CM | POA: Diagnosis not present

## 2020-10-02 LAB — COMPREHENSIVE METABOLIC PANEL
A/G RATIO: 2.5 — ABNORMAL HIGH (ref 1.2–2.2)
ALBUMIN: 4.9 g/dL — ABNORMAL HIGH (ref 3.8–4.8)
ALKALINE PHOSPHATASE: 59 IU/L (ref 44–121)
ALT (SGPT): 17 IU/L (ref 0–32)
AST (SGOT): 23 IU/L (ref 0–40)
BILIRUBIN TOTAL: 0.4 mg/dL (ref 0.0–1.2)
BLOOD UREA NITROGEN: 20 mg/dL (ref 8–27)
BUN / CREAT RATIO: 22 (ref 12–28)
CALCIUM: 9.7 mg/dL (ref 8.7–10.3)
CHLORIDE: 100 mmol/L (ref 96–106)
CO2: 22 mmol/L (ref 20–29)
CREATININE: 0.9 mg/dL (ref 0.57–1.00)
EGFR: 73 mL/min/{1.73_m2}
GLOBULIN, TOTAL: 2 g/dL (ref 1.5–4.5)
GLUCOSE: 94 mg/dL (ref 65–99)
POTASSIUM: 4.7 mmol/L (ref 3.5–5.2)
SODIUM: 141 mmol/L (ref 134–144)
TOTAL PROTEIN: 6.9 g/dL (ref 6.0–8.5)

## 2020-10-02 LAB — CBC W/ DIFFERENTIAL
BANDED NEUTROPHILS ABSOLUTE COUNT: 0 10*3/uL (ref 0.0–0.1)
BASOPHILS ABSOLUTE COUNT: 0 10*3/uL (ref 0.0–0.2)
BASOPHILS RELATIVE PERCENT: 1 %
EOSINOPHILS ABSOLUTE COUNT: 0.1 10*3/uL (ref 0.0–0.4)
EOSINOPHILS RELATIVE PERCENT: 1 %
HEMATOCRIT: 39.4 % (ref 34.0–46.6)
HEMOGLOBIN: 13.1 g/dL (ref 11.1–15.9)
IMMATURE GRANULOCYTES: 0 %
LYMPHOCYTES ABSOLUTE COUNT: 2 10*3/uL (ref 0.7–3.1)
LYMPHOCYTES RELATIVE PERCENT: 27 %
MEAN CORPUSCULAR HEMOGLOBIN CONC: 33.2 g/dL (ref 31.5–35.7)
MEAN CORPUSCULAR HEMOGLOBIN: 31.3 pg (ref 26.6–33.0)
MEAN CORPUSCULAR VOLUME: 94 fL (ref 79–97)
MONOCYTES ABSOLUTE COUNT: 0.5 10*3/uL (ref 0.1–0.9)
MONOCYTES RELATIVE PERCENT: 7 %
NEUTROPHILS ABSOLUTE COUNT: 4.6 10*3/uL (ref 1.4–7.0)
NEUTROPHILS RELATIVE PERCENT: 64 %
PLATELET COUNT: 186 10*3/uL (ref 150–450)
RED BLOOD CELL COUNT: 4.19 x10E6/uL (ref 3.77–5.28)
RED CELL DISTRIBUTION WIDTH: 12.3 % (ref 11.7–15.4)
WHITE BLOOD CELL COUNT: 7.2 10*3/uL (ref 3.4–10.8)

## 2020-10-02 LAB — AFP TUMOR MARKER: AFP-TUMOR MARKER: 4.1 ng/mL (ref 0.0–9.2)

## 2020-10-31 NOTE — Unmapped (Signed)
[facility] Refill Coordination Note    Specialty Medication(s) to be Shipped:   Transplant: mycophenolate mofetil 500mg     Other medication(s) to be shipped: No additional medications requested for fill at this time     Margaret Berger, DOB: [birth year]  Phone: [phone] (home)       All above HIPAA information was verified with patient.     Was a Nurse, learning disability used for this call? No    Completed refill call assessment today to schedule patient's medication shipment from the [facility] [phone]).  All relevant notes have been reviewed.     Specialty medication(s) and dose(s) confirmed: Regimen is correct and unchanged.   Changes to medications: Margaret Berger reports no changes at this time.  Changes to insurance: No  New side effects reported not previously addressed with a pharmacist or physician: None reported  Questions for the pharmacist: No    Confirmed patient received a Conservation officer, historic buildings and a Surveyor, mining with first shipment. The patient will receive a drug information handout for each medication shipped and additional FDA Medication Guides as required.       DISEASE/MEDICATION-SPECIFIC INFORMATION        N/A    SPECIALTY MEDICATION ADHERENCE     Medication Adherence    Patient reported X missed doses in the last month: 0  Specialty Medication: mycophenolate 500mg   Patient is on additional specialty medications: No              Were doses missed due to medication being on hold? No    mycophenolate 500mg : 7 days worth of medication on hand.      REFERRAL TO PHARMACIST     Referral to the pharmacist: Not needed      [facility]     Shipping address confirmed in Epic.     Delivery Scheduled: Yes, Expected medication delivery date: 11/02/20.     Medication will be delivered via UPS to the prescription address in Epic WAM.    Margaret Berger   [facility] Specialty Technician

## 2020-11-01 MED FILL — MYCOPHENOLATE MOFETIL 500 MG TABLET: ORAL | 30 days supply | Qty: 120 | Fill #1

## 2020-11-13 ENCOUNTER — Telehealth: Payer: Self-pay | Admitting: Internal Medicine

## 2020-11-13 DIAGNOSIS — Z7184 Encounter for health counseling related to travel: Secondary | ICD-10-CM

## 2020-11-13 NOTE — Telephone Encounter (Signed)
I haven't seen her in 3 years,  So I am not comfortable prescribing the medication but will b happy to refer her and her family to [facility] for his  What are her son's names and husband's names?  haven

## 2020-11-13 NOTE — Telephone Encounter (Signed)
Patient, husband and 2 sons are going to [place] in June and she is requesting malaria pills for her and family. Patient states that Dr. Derrel Nip sees her husband and one son, the other son lives out of state.

## 2020-11-14 NOTE — Telephone Encounter (Signed)
Husband: Saraih Lorton  Son: Deshanti Adcox and Tnya Ades   Pt scheduled an appt for a physical

## 2020-11-15 ENCOUNTER — Telehealth: Payer: Self-pay | Admitting: Internal Medicine

## 2020-11-15 NOTE — Telephone Encounter (Signed)
lft vm for pt to call ofc regarding travel clinic referral. Thanks

## 2020-11-30 NOTE — Unmapped (Signed)
[facility] Refill Coordination Note    Specialty Medication(s) to be Shipped:   Transplant: mycophenolate mofetil 500mg     Other medication(s) to be shipped: No additional medications requested for fill at this time     Margaret Berger, DOB: June 26, 1961  Phone: [phone] (home)       All above HIPAA information was verified with patient.     Was a Nurse, learning disability used for this call? No    Completed refill call assessment today to schedule patient's medication shipment from the [facility] [phone]).  All relevant notes have been reviewed.     Specialty medication(s) and dose(s) confirmed: Regimen is correct and unchanged.   Changes to medications: Margaret Berger reports no changes at this time.  Changes to insurance: No  New side effects reported not previously addressed with a pharmacist or physician: None reported  Questions for the pharmacist: No    Confirmed patient received a Conservation officer, historic buildings and a Surveyor, mining with first shipment. The patient will receive a drug information handout for each medication shipped and additional FDA Medication Guides as required.       DISEASE/MEDICATION-SPECIFIC INFORMATION        N/A    SPECIALTY MEDICATION ADHERENCE     Medication Adherence    Patient reported X missed doses in the last month: 0  Specialty Medication: mycophenolate 500mg   Patient is on additional specialty medications: No  Any gaps in refill history greater than 2 weeks in the last 3 months: no  Demonstrates understanding of importance of adherence: yes  Informant: patient  Reliability of informant: reliable  Confirmed plan for next specialty medication refill: delivery by pharmacy  Refills needed for supportive medications: not needed              Were doses missed due to medication being on hold? No    mycophenolate 500mg : 7 days worth of medication on hand.      REFERRAL TO PHARMACIST     Referral to the pharmacist: Not needed      [facility]     Shipping address confirmed in Epic.     Delivery Scheduled: Yes, Expected medication delivery date: 12/04/2020.     Medication will be delivered via Next Day Courier to the prescription address in Epic WAM.    Margaret Berger   [facility] Specialty Technician

## 2020-12-03 ENCOUNTER — Other Ambulatory Visit: Payer: Self-pay | Admitting: Internal Medicine

## 2020-12-03 ENCOUNTER — Telehealth: Payer: Self-pay | Admitting: Internal Medicine

## 2020-12-03 DIAGNOSIS — Z7184 Encounter for health counseling related to travel: Secondary | ICD-10-CM | POA: Insufficient documentation

## 2020-12-03 MED FILL — MYCOPHENOLATE MOFETIL 500 MG TABLET: ORAL | 30 days supply | Qty: 120 | Fill #2

## 2020-12-05 ENCOUNTER — Other Ambulatory Visit: Payer: Self-pay

## 2020-12-05 ENCOUNTER — Ambulatory Visit (INDEPENDENT_AMBULATORY_CARE_PROVIDER_SITE_OTHER): Payer: BC Managed Care – PPO | Admitting: Internal Medicine

## 2020-12-05 ENCOUNTER — Other Ambulatory Visit (HOSPITAL_COMMUNITY)
Admission: RE | Admit: 2020-12-05 | Discharge: 2020-12-05 | Disposition: A | Discharge status: Home / Self Care | Payer: BC Managed Care – PPO | Source: Ambulatory Visit | Attending: Internal Medicine | Admitting: Internal Medicine

## 2020-12-05 ENCOUNTER — Encounter: Payer: Self-pay | Admitting: Internal Medicine

## 2020-12-05 VITALS — BP 102/70 | HR 73 | Temp 97.6°F | Ht 64.75 in | Wt 143.4 lb

## 2020-12-05 DIAGNOSIS — Z124 Encounter for screening for malignant neoplasm of cervix: Secondary | ICD-10-CM

## 2020-12-05 DIAGNOSIS — K754 Autoimmune hepatitis: Secondary | ICD-10-CM

## 2020-12-05 DIAGNOSIS — Z7184 Encounter for health counseling related to travel: Secondary | ICD-10-CM | POA: Diagnosis not present

## 2020-12-05 DIAGNOSIS — Z1211 Encounter for screening for malignant neoplasm of colon: Secondary | ICD-10-CM | POA: Diagnosis not present

## 2020-12-05 DIAGNOSIS — N83202 Unspecified ovarian cyst, left side: Secondary | ICD-10-CM

## 2020-12-05 DIAGNOSIS — Z0000 Encounter for general adult medical examination without abnormal findings: Secondary | ICD-10-CM | POA: Diagnosis not present

## 2020-12-05 MED ORDER — PREDNISONE 10 MG PO TABS: ORAL_TABLET | ORAL | 0 refills | Status: DC

## 2020-12-05 MED ORDER — LEVOFLOXACIN 500 MG PO TABS: 500.0000 mg | ORAL_TABLET | Freq: Every day | ORAL | 0 refills | Status: AC

## 2020-12-05 MED ORDER — DOXYCYCLINE HYCLATE 100 MG PO TABS: 100.0000 mg | ORAL_TABLET | Freq: Two times a day (BID) | ORAL | 0 refills | Status: DC

## 2020-12-05 NOTE — Progress Notes (Signed)
Patient ID: Jessica Zuniga, female    DOB: [birth year]  Age: 60 y.o. MRN: [id number]  The patient is here for annual  wellness examination and management of other chronic and acute problems.  Last seen June 2019   The risk factors are reflected in the social history.  The roster of all physicians providing medical care to patient - is listed in the Snapshot section of the chart.  Activities of daily living:  The patient is 100% independent in all ADLs: dressing, toileting, feeding as well as independent mobility  Home safety : The patient has smoke detectors in the home. They wear seatbelts.  There are no firearms at home. There is no violence in the home.   There is no risks for viral hepatitis, STDs or HIV. There is no   history of blood transfusion. They have no travel history to infectious disease endemic areas of the world.  The patient has seen their dentist in the last six month. They have seen their eye doctor in the last year. They admit to slight hearing difficulty with regard to whispered voices and some television programs.  They have deferred audiologic testing in the last year.  They do not  have excessive sun exposure. Discussed the need for sun protection: hats, long sleeves and use of sunscreen if there is significant sun exposure.   Diet: the importance of a healthy diet is discussed. They do have a healthy diet.  The benefits of regular aerobic exercise were discussed. She walks 4 times per week ,  20 minutes.   Depression screen: there are no signs or vegative symptoms of depression- irritability, change in appetite, anhedonia, sadness/tearfullness.  Cognitive assessment: the patient manages all their financial and personal affairs and is actively engaged. They could relate day,date,year and events; recalled 2/3 objects at 3 minutes; performed clock-face test normally.  The following portions of the patient's history were reviewed and updated as appropriate: allergies, current  medications, past family history, past medical history,  past surgical history, past social history  and problem list.  Visual acuity was not assessed per patient preference since she has regular follow up with her ophthalmologist. Hearing and body mass index were assessed and reviewed.   During the course of the visit the patient was educated and counseled about appropriate screening and preventive services including : fall prevention , diabetes screening, nutrition counseling, colorectal cancer screening, and recommended immunizations.    CC: The primary encounter diagnosis was Cervical cancer screening. Diagnoses of Autoimmune hepatitis ([place]), Colon cancer screening, Left ovarian cyst, Travel advice encounter, and Encounter for preventive health examination were also pertinent to this visit.   Jessica Zuniga was last seen in 2019, shortly after she was diagnosed with autoimmune hepatitis.  Her condition is stable,  And managed with Cellcept,  But she has been diagnosed with cirrhosis and receives annual surveillance for hepatocellular CA with MRI and AFP. Her condition is managed by Dr Maceo Pro at [facility]   Travel : she is flying to [place] on June 2 with family and friends.  She has received an rx for  malaria prophylaxis and was told she did not need yellow fever vaccine.   History Jessica Zuniga has a past medical history of Chest pain, Hepatitis, HLD (hyperlipidemia), Ovarian cyst (08/2019), and Papilloma of breast.   She has a past surgical history that includes Breast surgery (Right, 1997); Knee arthroscopy with anterior cruciate ligament (acl) repair (Left, 2015); Laparoscopic bilateral salpingo oophorectomy (N/A, 09/08/2019); Laparoscopic unilateral salpingectomy (  Right, 09/08/2019); Breast excisional biopsy (Right, 1997); and Breast biopsy (Right, 12/2012).   Her family history includes Heart attack in her father; Heart disease in her maternal grandmother and paternal grandmother.She reports that she has never  smoked. She has never used smokeless tobacco. She reports current alcohol use. She reports that she does not use drugs.  Outpatient Medications Prior to Visit  Medication Sig Dispense Refill  . mycophenolate (CELLCEPT) 500 MG tablet Take 1,000 mg by mouth 2 (two) times daily.     . mycophenolate (CELLCEPT) 500 MG tablet Take 2 tablets by mouth daily.    Marland Kitchen ibuprofen (ADVIL) 600 MG tablet TAKE 1 TABLET (600 MG TOTAL) BY MOUTH EVERY 6 (SIX) HOURS AS NEEDED FOR MILD PAIN OR CRAMPING. 30 tablet 0   No facility-administered medications prior to visit.    Review of Systems   Patient denies headache, fevers, malaise, unintentional weight loss, skin rash, eye pain, sinus congestion and sinus pain, sore throat, dysphagia,  hemoptysis , cough, dyspnea, wheezing, chest pain, palpitations, orthopnea, edema, abdominal pain, nausea, melena, diarrhea, constipation, flank pain, dysuria, hematuria, urinary  Frequency, nocturia, numbness, tingling, seizures,  Focal weakness, Loss of consciousness,  Tremor, insomnia, depression, anxiety, and suicidal ideation.     Objective:  BP 102/70 (BP Location: Right Arm, Patient Position: Sitting, Cuff Size: Normal)   Pulse 73   Temp 97.6 F (36.4 C) (Oral)   Ht 5' 4.75" (1.645 m)   Wt 143 lb 6.4 oz (65 kg)   SpO2 98%   BMI 24.05 kg/m   Physical Exam  General Appearance:    Alert, cooperative, no distress, appears stated age  Head:    Normocephalic, without obvious abnormality, atraumatic  Eyes:    PERRL, conjunctiva/corneas clear, EOM's intact, fundi    benign, both eyes  Ears:    Normal TM's and external ear canals, both ears  Nose:   Nares normal, septum midline, mucosa normal, no drainage    or sinus tenderness  Throat:   Lips, mucosa, and tongue normal; teeth and gums normal  Neck:   Supple, symmetrical, trachea midline, no adenopathy;    thyroid:  no enlargement/tenderness/nodules; no carotid   bruit or JVD  Back:     Symmetric, no curvature, ROM  normal, no CVA tenderness  Lungs:     Clear to auscultation bilaterally, respirations unlabored  Chest Wall:    No tenderness or deformity   Heart:    Regular rate and rhythm, S1 and S2 normal, no murmur, rub   or gallop  Breast Exam:    No tenderness, masses, or nipple abnormality  Abdomen:     Soft, non-tender, bowel sounds active all four quadrants,    no masses, no organomegaly  Genitalia:    Pelvic: cervix normal in appearance, external genitalia normal, no adnexal masses or tenderness, no cervical motion tenderness, rectovaginal septum normal, uterus normal size, shape, and consistency and vagina normal without discharge  Extremities:   Extremities normal, atraumatic, no cyanosis or edema  Pulses:   2+ and symmetric all extremities  Skin:   Skin color, texture, turgor normal, no rashes or lesions  Lymph nodes:   Cervical, supraclavicular, and axillary nodes normal  Neurologic:   CNII-XII intact, normal strength, sensation and reflexes    throughout     Assessment & Plan:   Problem List Items Addressed This Visit      Unprioritized   Autoimmune hepatitis ([place])    Diagnosed in 2018 after liver enzymes became wildly  elevated.  Now Managed by Dr [facility]o Pro at [facility].  .  Annual imaging due to cirrhosis .  Most recent liver enzymes were normal in March         Encounter for preventive health examination    Referral for colonoscopy in process; last one was 2010. PAP smear done today and mammogram is up to date  age appropriate education and counseling updated, referrals for preventative services and immunizations addressed, dietary and smoking counseling addressed, most recent labs reviewed.  I have personally reviewed and have noted:  1) the patient's medical and social history 2) The pt's use of alcohol, tobacco, and illicit drugs 3) The patient's current medications and supplements 4) Functional ability including ADL's, fall risk, home safety risk, hearing and visual impairment 5)  Diet and physical activities 6) Evidence for depression or mood disorder 7) The patient's height, weight, and BMI have been recorded in the chart  I have made referrals, and provided counseling and education based on review of the above       Left ovarian cyst    Benign , removed Feb 2021      Travel advice encounter    She was told she did not need yellow fever vaccine.  She has been given malaria prophylaxis  [facility]   and meds for URI/UTI/diarrhea from me.  DIRECTED to [facility] for COVID TESTING on Tuesday  Prior to Thursday flight        Other Visit Diagnoses    Cervical cancer screening    -  Primary   Relevant Orders   Cytology - PAP (Completed)   Colon cancer screening       Relevant Orders   Ambulatory referral to Gastroenterology      I am having Jessica Zuniga start on doxycycline, predniSONE, and levofloxacin. I am also having her maintain her mycophenolate and ibuprofen.  Meds ordered this encounter  Medications  . doxycycline (VIBRA-TABS) 100 MG tablet    Sig: Take 1 tablet (100 mg total) by mouth 2 (two) times daily.    Dispense:  20 tablet    Refill:  0  . predniSONE (DELTASONE) 10 MG tablet    Sig: 6 tablets on Day 1 , then reduce by 1 tablet daily until gone    Dispense:  21 tablet    Refill:  0  . levofloxacin (LEVAQUIN) 500 MG tablet    Sig: Take 1 tablet (500 mg total) by mouth daily for 7 days.    Dispense:  7 tablet    Refill:  0    Medications Discontinued During This Encounter  Medication Reason  . mycophenolate (CELLCEPT) 832 MG tablet Duplicate    Follow-up: No follow-ups on file.   Crecencio Mc, MD

## 2020-12-05 NOTE — Patient Instructions (Addendum)
HAVE A FANTASTIC TRIP  Please see if GI will add TSH and LIPID TO NEXT LABS  THE MEDS I SENT IN:  DOXY FOR TICK BITES WITH REDNESS .  TAKE WITH FOOD  LEVAQUIN AND PREDNISONE FOR ANY RESPIRATORY OF SINUS  SYMPTOMS  ALSO WORKS FOR  BLADDER OR DIARRHEA   COLONOSCOPY TO BE REFERRED TO DR [facility]

## 2020-12-06 LAB — CYTOLOGY - PAP
Comment: NEGATIVE
Diagnosis: NEGATIVE
High risk HPV: NEGATIVE

## 2020-12-07 ENCOUNTER — Encounter: Payer: Self-pay | Admitting: Internal Medicine

## 2020-12-07 NOTE — Assessment & Plan Note (Signed)
She was told she did not need yellow fever vaccine.  She has been given malaria prophylaxis  From  [facility]   and meds for URI/UTI/diarrhea from me.  DIRECTED to [facility] for COVID TESTING on Tuesday  Prior to Thursday flight

## 2020-12-07 NOTE — Assessment & Plan Note (Signed)
Benign , removed Feb 2021

## 2020-12-07 NOTE — Assessment & Plan Note (Signed)
Diagnosed in 2018 after liver enzymes became wildly elevated.  Now Managed by Dr Maceo Pro at [facility].  .  Annual imaging due to cirrhosis .  Most recent liver enzymes were normal in March

## 2020-12-07 NOTE — Assessment & Plan Note (Signed)
Referral for colonoscopy in process; last one was 2010. PAP smear done today and mammogram is up to date  age appropriate education and counseling updated, referrals for preventative services and immunizations addressed, dietary and smoking counseling addressed, most recent labs reviewed.  I have personally reviewed and have noted:  1) the patient's medical and social history 2) The pt's use of alcohol, tobacco, and illicit drugs 3) The patient's current medications and supplements 4) Functional ability including ADL's, fall risk, home safety risk, hearing and visual impairment 5) Diet and physical activities 6) Evidence for depression or mood disorder 7) The patient's height, weight, and BMI have been recorded in the chart  I have made referrals, and provided counseling and education based on review of the above

## 2020-12-18 ENCOUNTER — Other Ambulatory Visit: Payer: Self-pay

## 2020-12-18 DIAGNOSIS — Z1211 Encounter for screening for malignant neoplasm of colon: Secondary | ICD-10-CM

## 2020-12-18 MED ORDER — SUPREP BOWEL PREP KIT 17.5-3.13-1.6 GM/177ML PO SOLN: 1.0000 | ORAL | 0 refills | Status: DC

## 2020-12-28 ENCOUNTER — Encounter: Payer: Self-pay | Admitting: Gastroenterology

## 2020-12-28 NOTE — Unmapped (Signed)
[facility] Clinical Assessment & Refill Coordination Note    Margaret Berger, DOB: [birth year]  Phone: [phone] (home)     All above HIPAA information was verified with patient.     Was a Nurse, learning disability used for this call? No    Specialty Medication(s):   Inflammatory Disorders: mycophenolate     Current Outpatient Medications   Medication Sig Dispense Refill   ??? ibuprofen (MOTRIN) 600 MG tablet Take 600 mg by mouth every six (6) hours as needed.     ??? mycophenolate (CELLCEPT) 500 mg tablet Take 2 tablets (1,000 mg total) by mouth 2 (two) times a day with meals. 360 tablet 3     No current facility-administered medications for this visit.        Changes to medications: Verleen reports no changes at this time.    Allergies   Allergen Reactions   ??? Azathioprine Other (See Comments)     Fever  Other reaction(s): Other (See Comments)  Fever       Changes to allergies: No    SPECIALTY MEDICATION ADHERENCE     Mycophenolate mofetil 500 mg: approximately 8 days of medicine on hand       Medication Adherence    Patient reported X missed doses in the last month: 0  Specialty Medication: mycophenolate mofetil  500mg   Patient is on additional specialty medications: No  Any gaps in refill history greater than 2 weeks in the last 3 months: no  Demonstrates understanding of importance of adherence: yes  Informant: patient  Provider-estimated medication adherence level: good  Patient is at risk for Non-Adherence: No          Specialty medication(s) dose(s) confirmed: Regimen is correct and unchanged.     Are there any concerns with adherence? No    Adherence counseling provided? Not needed    CLINICAL MANAGEMENT AND INTERVENTION      Clinical Benefit Assessment:    Do you feel the medicine is effective or helping your condition? Yes    Labs:       ?? Component Ref Range & Units 2 mo ago   (10/01/20) 5 mo ago   (07/16/20) 5 mo ago   (07/11/20) 5 mo ago   (07/08/20) 6 mo ago   (06/28/20) 11 mo ago (01/25/20) 1 yr ago   (12/05/19)    Glucose 65 - 99 mg/dL 94  478??High??  92  108 R  93  106 R  97     BUN 8 - 27 mg/dL 20  11  12   6??Low?? R  12  14 R  18     Creatinine 0.57 - 1.00 mg/dL 2.95  6.21  3.08??High??  0.67 R  0.74  0.66 R  0.74     eGFR >59 mL/min/1.73 73           BUN/Creatinine Ratio 12 - 28 22  14   10??Low??  9 R  16  21 R  24     Sodium 134 - 144 mmol/L 141  136  139  136 R  139  136 R  141     Potassium 3.5 - 5.2 mmol/L 4.7  4.7  4.2  3.9 R  4.2  4.4 R  4.7     Chloride 96 - 106 mmol/L 100  98  99  102 R  103  104 R  103     CO2 20 - 29 mmol/L 22  22  24  25.0 R  25  26.9 R  23     Calcium 8.7 - 10.3 mg/dL 9.7  16.1  9.8  09.6 R  9.7  10.3 R  9.6     Total Protein 6.0 - 8.5 g/dL 6.9  7.5  7.0   6.8   6.6     Albumin 3.8 - 4.8 g/dL 4.9??High??  4.9??High??  5.0??High??  4.1 R  4.9??High??  4.1 R  4.7     Globulin, Total 1.5 - 4.5 g/dL 2.0  2.6  2.0   1.9   1.9     A/G Ratio 1.2 - 2.2 2.5??High??  1.9  2.5??High??   2.6??High??   2.5??High??     Total Bilirubin 0.0 - 1.2 mg/dL 0.4  0.4  0.3  0.5 R  0.3  0.6 R  0.3     Alkaline Phosphatase 44 - 121 IU/L 59  61 CM  57 CM  61 R  55 CM  52 R  53 R, CM     AST 0 - 40 IU/L 23  20  19  25  R  26  22 R  29     ALT 0 - 32 IU/L 17  18  17  19  R  20  22 R  21    Resulting Agency  01 01 01 [facility] 01 [facility] 01             Narrative  Performed by: [facility]  Performed at: ??[address] - [facility]   [address] [place], [place] ??[id number]   Lab Director: Gregary Signs Farrier MD, Phone: ??[phone]      Specimen Collected: 10/01/20 14:29 Last Resulted: 10/02/20 11:36                 Clinical Benefit counseling provided? Labs from 10/01/20 show evidence of clinical benefit    Adverse Effects Assessment:    Are you experiencing any side effects? No    Are you experiencing difficulty administering your medicine? No    Quality of Life Assessment:    How many days over the past month did your Autoimmune Hepatitis  keep you from your normal activities? For example, brushing your teeth or getting up in the morning. 0    Have you discussed this with your provider? Not needed    Acute Infection Status:    Acute infections noted within Epic:  No active infections  Patient reported infection: None    Therapy Appropriateness:    Is therapy appropriate? Yes, therapy is appropriate and should be continued    DISEASE/MEDICATION-SPECIFIC INFORMATION      N/A    PATIENT SPECIFIC NEEDS     - Does the patient have any physical, cognitive, or cultural barriers? No    - Is the patient high risk? Yes, patient is taking a REMS drug. Medication is dispensed in compliance with REMS program    - Does the patient require a Care Management Plan? No     - Does the patient require physician intervention or other additional services (i.e. nutrition, smoking cessation, social work)? No      SHIPPING     Specialty Medication(s) to be Shipped:   Inflammatory Disorders: mycophenolate    Other medication(s) to be shipped: No additional medications requested for fill at this time     Changes to insurance: No    Delivery Scheduled: Yes, Expected medication delivery date: 01/01/21.     Medication will be delivered via UPS to the  confirmed temporary address in [facility]    The patient will receive a drug information handout for each medication shipped and additional FDA Medication Guides as required.  Verified that patient has previously received a Conservation officer, historic buildings and a Surveyor, mining.    The patient or caregiver noted above participated in the development of this care plan and knows that they can request review of or adjustments to the care plan at any time.      All of the patient's questions and concerns have been addressed.    Roderic Palau   [facility] Specialty Pharmacist

## 2020-12-31 ENCOUNTER — Other Ambulatory Visit: Payer: Self-pay | Admitting: Gastroenterology

## 2020-12-31 ENCOUNTER — Telehealth: Payer: Self-pay | Admitting: Gastroenterology

## 2020-12-31 MED ORDER — SUTAB 1479-225-188 MG PO TABS: 12.0000 | ORAL_TABLET | Freq: Two times a day (BID) | ORAL | 0 refills | Status: DC

## 2020-12-31 MED FILL — MYCOPHENOLATE MOFETIL 500 MG TABLET: ORAL | 30 days supply | Qty: 120 | Fill #3

## 2020-12-31 NOTE — Telephone Encounter (Signed)
Patient requests pills for prep?

## 2020-12-31 NOTE — Telephone Encounter (Signed)
Sent Sutab to the pharmacy. Sent instructions to the pharmacy on how to take the medication

## 2021-01-07 ENCOUNTER — Encounter
Admission: RE | Disposition: A | Discharge status: Home / Self Care | Payer: Self-pay | Source: Home / Self Care | Attending: Gastroenterology

## 2021-01-07 ENCOUNTER — Ambulatory Visit
Admission: RE | Admit: 2021-01-07 | Discharge: 2021-01-07 | Disposition: A | Discharge status: Home / Self Care | Payer: BC Managed Care – PPO | Attending: Gastroenterology | Admitting: Gastroenterology

## 2021-01-07 ENCOUNTER — Ambulatory Visit: Payer: BC Managed Care – PPO | Admitting: Anesthesiology

## 2021-01-07 ENCOUNTER — Other Ambulatory Visit: Payer: Self-pay

## 2021-01-07 ENCOUNTER — Encounter: Payer: Self-pay | Admitting: Gastroenterology

## 2021-01-07 DIAGNOSIS — Z1211 Encounter for screening for malignant neoplasm of colon: Secondary | ICD-10-CM

## 2021-01-07 DIAGNOSIS — K5730 Diverticulosis of large intestine without perforation or abscess without bleeding: Secondary | ICD-10-CM | POA: Diagnosis not present

## 2021-01-07 DIAGNOSIS — D125 Benign neoplasm of sigmoid colon: Secondary | ICD-10-CM | POA: Insufficient documentation

## 2021-01-07 DIAGNOSIS — K640 First degree hemorrhoids: Secondary | ICD-10-CM | POA: Insufficient documentation

## 2021-01-07 DIAGNOSIS — Z888 Allergy status to other drugs, medicaments and biological substances status: Secondary | ICD-10-CM | POA: Diagnosis not present

## 2021-01-07 DIAGNOSIS — Z7952 Long term (current) use of systemic steroids: Secondary | ICD-10-CM | POA: Insufficient documentation

## 2021-01-07 DIAGNOSIS — Z791 Long term (current) use of non-steroidal anti-inflammatories (NSAID): Secondary | ICD-10-CM | POA: Insufficient documentation

## 2021-01-07 DIAGNOSIS — Z8249 Family history of ischemic heart disease and other diseases of the circulatory system: Secondary | ICD-10-CM | POA: Insufficient documentation

## 2021-01-07 DIAGNOSIS — K635 Polyp of colon: Secondary | ICD-10-CM

## 2021-01-07 DIAGNOSIS — K529 Noninfective gastroenteritis and colitis, unspecified: Secondary | ICD-10-CM | POA: Diagnosis not present

## 2021-01-07 HISTORY — PX: COLONOSCOPY WITH PROPOFOL: SHX5780

## 2021-01-07 HISTORY — PX: POLYPECTOMY: SHX5525

## 2021-01-07 SURGERY — COLONOSCOPY WITH PROPOFOL
Anesthesia: General | Site: Rectum

## 2021-01-07 MED ORDER — PROPOFOL 10 MG/ML IV BOLUS
INTRAVENOUS | Status: DC | PRN
  Administered 2021-01-07: 50 mg via INTRAVENOUS
  Administered 2021-01-07: 30 mg via INTRAVENOUS
  Administered 2021-01-07: 120 mg via INTRAVENOUS
  Administered 2021-01-07: 20 mg via INTRAVENOUS
  Administered 2021-01-07: 30 mg via INTRAVENOUS

## 2021-01-07 MED ORDER — LACTATED RINGERS IV SOLN: INTRAVENOUS | Status: DC

## 2021-01-07 MED ORDER — LIDOCAINE HCL (CARDIAC) PF 100 MG/5ML IV SOSY
PREFILLED_SYRINGE | INTRAVENOUS | Status: DC | PRN
  Administered 2021-01-07: 50 mg via INTRAVENOUS

## 2021-01-07 MED ORDER — STERILE WATER FOR IRRIGATION IR SOLN
Status: DC | PRN
  Administered 2021-01-07: 100 mL

## 2021-01-07 MED ORDER — LACTATED RINGERS IV SOLN: INTRAVENOUS | Status: DC | PRN

## 2021-01-07 SURGICAL SUPPLY — 22 items
CLIP HMST 235XBRD CATH ROT (MISCELLANEOUS) IMPLANT
CLIP RESOLUTION 360 11X235 (MISCELLANEOUS)
ELECT REM PT RETURN 9FT ADLT (ELECTROSURGICAL)
ELECTRODE REM PT RTRN 9FT ADLT (ELECTROSURGICAL) IMPLANT
FORCEPS BIOP RAD 4 LRG CAP 4 (CUTTING FORCEPS) ×2 IMPLANT
GOWN CVR UNV OPN BCK APRN NK (MISCELLANEOUS) ×2 IMPLANT
GOWN ISOL THUMB LOOP REG UNIV (MISCELLANEOUS) ×4
INJECTOR VARIJECT VIN23 (MISCELLANEOUS) IMPLANT
KIT DEFENDO VALVE AND CONN (KITS) IMPLANT
KIT PRC NS LF DISP ENDO (KITS) ×1 IMPLANT
KIT PROCEDURE OLYMPUS (KITS) ×2
MANIFOLD NEPTUNE II (INSTRUMENTS) ×2 IMPLANT
MARKER SPOT ENDO TATTOO 5ML (MISCELLANEOUS) IMPLANT
PROBE APC STR FIRE (PROBE) IMPLANT
RETRIEVER NET ROTH 2.5X230 LF (MISCELLANEOUS) IMPLANT
SNARE COLD EXACTO (MISCELLANEOUS) IMPLANT
SNARE SHORT THROW 13M SML OVAL (MISCELLANEOUS) IMPLANT
SNARE SNG USE RND 15MM (INSTRUMENTS) IMPLANT
SPOT EX ENDOSCOPIC TATTOO (MISCELLANEOUS)
TRAP ETRAP POLY (MISCELLANEOUS) IMPLANT
VARIJECT INJECTOR VIN23 (MISCELLANEOUS)
WATER STERILE IRR 250ML POUR (IV SOLUTION) ×2 IMPLANT

## 2021-01-07 NOTE — H&P (Signed)
Lucilla Lame, MD [address] [place], [place] [address] Phone: [phone] Fax : [phone]  Primary Care Physician:  Crecencio Mc, MD Primary Gastroenterologist:  Dr. Allen Norris  Pre-Procedure History & Physical: HPI:  Jessica Zuniga is a 60 y.o. female is here for a screening colonoscopy.   Past Medical History:  Diagnosis Date   Chest pain    Hepatitis    autoimmune hepatitis, under control with cellcept and steroids   HLD (hyperlipidemia)    Ovarian cyst 08/2019   left   Papilloma of breast    right breast    Past Surgical History:  Procedure Laterality Date   BREAST BIOPSY Right 12/2012   stereo/ clip, SCLEROSING ADENOSIS AND APOCRINE METAPLASIA   BREAST EXCISIONAL BIOPSY Right 1997   papilloma   BREAST SURGERY Right 1997   papilloma    KNEE ARTHROSCOPY WITH ANTERIOR CRUCIATE LIGAMENT (ACL) REPAIR Left 2015   screws in knee   LAPAROSCOPIC BILATERAL SALPINGO OOPHERECTOMY N/A 09/08/2019   Procedure: LAPAROSCOPIC LEFT SALPINGO OOPHORECTOMY;  Surgeon: Will Bonnet, MD;  Location: [facility] ORS;  Service: Gynecology;  Laterality: N/A;   LAPAROSCOPIC UNILATERAL SALPINGECTOMY Right 09/08/2019   Procedure: LAPAROSCOPIC UNILATERAL SALPINGECTOMY;  Surgeon: Will Bonnet, MD;  Location: [facility] ORS;  Service: Gynecology;  Laterality: Right;    Prior to Admission medications   Medication Sig Start Date End Date Taking? Authorizing Provider  atovaquone-proguanil (MALARONE) 250-100 MG TABS tablet Take 1 tablet by mouth daily. Malaria prophylaxis, will finish prior to colonoscopy on 6/27   Yes [provider]  ibuprofen (ADVIL) 600 MG tablet TAKE 1 TABLET (600 MG TOTAL) BY MOUTH EVERY 6 (SIX) HOURS AS NEEDED FOR MILD PAIN OR CRAMPING. 10/03/19  Yes Will Bonnet, MD  mycophenolate (CELLCEPT) 500 MG tablet Take 1,000 mg by mouth 2 (two) times daily.  07/07/19  Yes [provider]  Na Sulfate-K Sulfate-Mg Sulf (SUPREP BOWEL PREP KIT)  17.5-3.13-1.6 GM/177ML SOLN Take 1 kit by mouth as directed. 12/18/20  Yes Lucilla Lame, MD  doxycycline (VIBRA-TABS) 100 MG tablet Take 1 tablet (100 mg total) by mouth 2 (two) times daily. Patient not taking: Reported on 12/28/2020 12/05/20   Crecencio Mc, MD  predniSONE (DELTASONE) 10 MG tablet 6 tablets on Day 1 , then reduce by 1 tablet daily until gone Patient not taking: Reported on 12/28/2020 12/05/20   Crecencio Mc, MD  Sodium Sulfate-Mag Sulfate-KCl (SUTAB) [phone] MG TABS Take 12 tablets by mouth in the morning and at bedtime. At 5pm take 12 tablets and then 5 hours prior to colonoscopy take the other 12. 12/31/20   Lucilla Lame, MD    Allergies as of 12/18/2020 - Review Complete 12/05/2020  Allergen Reaction Noted   Azathioprine  11/05/2016    Family History  Problem Relation Age of Onset   Heart attack Father        heart transplant   Heart disease Paternal Grandmother    Heart disease Maternal Grandmother    Breast cancer Neg Hx     Social History   Socioeconomic History   Marital status: Married    Spouse name: Brad   Number of children: Not on file   Years of education: Not on file   Highest education level: Not on file  Occupational History   Occupation: Chief Financial Officer    Comment: retired  Tobacco Use   Smoking status: Never   Smokeless tobacco: Never   Tobacco comments:    tobacco use -  no  Vaping Use   Vaping Use: Never used  Substance and Sexual Activity   Alcohol use: Not Currently   Drug use: No   Sexual activity: Yes    Birth control/protection: Post-menopausal  Other Topics Concern   Not on file  Social History Narrative   Married, full time.    Social Determinants of Health   Financial Resource Strain: Not on file  Food Insecurity: Not on file  Transportation Needs: Not on file  Physical Activity: Not on file  Stress: Not on file  Social Connections: Not on file  Intimate Partner Violence: Not on file    Review of Systems: See HPI,  otherwise negative ROS  Physical Exam: BP (!) 152/68   Pulse 71   Temp 97.8 F (36.6 C) (Temporal)   Resp 18   Ht 5' 4"  (1.626 m)   Wt 64.9 kg   SpO2 100%   BMI 24.55 kg/m  General:   Alert,  pleasant and cooperative in NAD Head:  Normocephalic and atraumatic. Neck:  Supple; no masses or thyromegaly. Lungs:  Clear throughout to auscultation.    Heart:  Regular rate and rhythm. Abdomen:  Soft, nontender and nondistended. Normal bowel sounds, without guarding, and without rebound.   Neurologic:  Alert and  oriented x4;  grossly normal neurologically.  Impression/Plan: Jessica Zuniga is now here to undergo a screening colonoscopy.  Risks, benefits, and alternatives regarding colonoscopy have been reviewed with the patient.  Questions have been answered.  All parties agreeable.

## 2021-01-07 NOTE — Op Note (Signed)
[facility] Patient Name: Jessica Zuniga Procedure Date: 01/07/2021 11:04 AM MRN: [id number] Account #: [id number] Date of Birth: [birth year] Admit Type: Outpatient Age: 60 Room: [facility] OR ROOM 01 Gender: Female Note Status: Finalized Procedure:             Colonoscopy Indications:           Screening for colorectal malignant neoplasm Providers:             Lucilla Lame MD, MD Referring MD:          Deborra Medina, MD (Referring MD) Medicines:             Propofol per Anesthesia Complications:         No immediate complications. Procedure:             Pre-Anesthesia Assessment:                        - Prior to the procedure, a History and Physical was                         performed, and patient medications and allergies were                         reviewed. The patient's tolerance of previous                         anesthesia was also reviewed. The risks and benefits                         of the procedure and the sedation options and risks                         were discussed with the patient. All questions were                         answered, and informed consent was obtained. Prior                         Anticoagulants: The patient has taken no previous                         anticoagulant or antiplatelet agents. ASA Grade                         Assessment: II - A patient with mild systemic disease.                         After reviewing the risks and benefits, the patient                         was deemed in satisfactory condition to undergo the                         procedure.                        After obtaining informed consent, the colonoscope was  passed under direct vision. Throughout the procedure,                         the patient's blood pressure, pulse, and oxygen                         saturations were monitored continuously. The                         Colonoscope was introduced through the anus  and                         advanced to the the cecum, identified by appendiceal                         orifice and ileocecal valve. The colonoscopy was                         performed without difficulty. The patient tolerated                         the procedure well. The quality of the bowel                         preparation was good. Findings:      The perianal and digital rectal examinations were normal.      A 3 mm polyp was found in the sigmoid colon. The polyp was sessile. The       polyp was removed with a cold biopsy forceps. Resection and retrieval       were complete.      A diffuse area of mildly erythematous mucosa was found in the sigmoid       colon. Biopsies were taken with a cold forceps for histology.      Non-bleeding internal hemorrhoids were found during retroflexion. The       hemorrhoids were Grade I (internal hemorrhoids that do not prolapse).      A few small-mouthed diverticula were found in the sigmoid colon. Impression:            - One 3 mm polyp in the sigmoid colon, removed with a                         cold biopsy forceps. Resected and retrieved.                        - Erythematous mucosa in the sigmoid colon. Biopsied.                        - Non-bleeding internal hemorrhoids.                        - Diverticulosis in the sigmoid colon. Recommendation:        - Discharge patient to home.                        - Resume previous diet.                        - Continue present medications.                        -  Await pathology results.                        - Repeat colonoscopy in 7 years for surveillance if                         adenomatous and 10 years if hyperplastic. Procedure Code(s):     --- Professional ---                        [phone], Colonoscopy, flexible; with biopsy, single or                         multiple Diagnosis Code(s):     --- Professional ---                        Z12.11, Encounter for screening for malignant  neoplasm                         of colon                        K63.5, Polyp of colon CPT copyright 2019 American Medical Association. All rights reserved. The codes documented in this report are preliminary and upon coder review may  be revised to meet current compliance requirements. Lucilla Lame MD, MD 01/07/2021 11:33:16 AM This report has been signed electronically. Number of Addenda: 0 Note Initiated On: 01/07/2021 11:04 AM Scope Withdrawal Time: 0 hours 9 minutes 58 seconds  Total Procedure Duration: 0 hours 14 minutes 45 seconds  Estimated Blood Loss:  Estimated blood loss: none.      [facility]

## 2021-01-07 NOTE — Transfer of Care (Signed)
Immediate Anesthesia Transfer of Care Note  Patient: Jessica Zuniga  Procedure(s) Performed: COLONOSCOPY WITH PROPOFOL (Rectum) POLYPECTOMY (Rectum)  Patient Location: PACU  Anesthesia Type: General  Level of Consciousness: awake, alert  and patient cooperative  Airway and Oxygen Therapy: Patient Spontanous Breathing and Patient connected to supplemental oxygen  Post-op Assessment: Post-op Vital signs reviewed, Patient's Cardiovascular Status Stable, Respiratory Function Stable, Patent Airway and No signs of Nausea or vomiting  Post-op Vital Signs: Reviewed and stable  Complications: No notable events documented.

## 2021-01-07 NOTE — Anesthesia Procedure Notes (Signed)
Date/Time: 01/07/2021 11:12 AM Performed by: Cameron Ali, CRNA Pre-anesthesia Checklist: Patient identified, Emergency Drugs available, Suction available, Timeout performed and Patient being monitored Patient Re-evaluated:Patient Re-evaluated prior to induction Oxygen Delivery Method: Nasal cannula Placement Confirmation: positive ETCO2

## 2021-01-07 NOTE — Anesthesia Preprocedure Evaluation (Signed)
Anesthesia Evaluation  Patient identified by MRN, date of birth, ID band Patient awake    Reviewed: Allergy & Precautions, H&P , NPO status , Patient's Chart, lab work & pertinent test results, reviewed documented beta blocker date and time   Airway Mallampati: II  TM Distance: >3 FB Neck ROM: full    Dental no notable dental hx.    Pulmonary neg pulmonary ROS,    Pulmonary exam normal breath sounds clear to auscultation       Cardiovascular Exercise Tolerance: Good negative cardio ROS   Rhythm:regular Rate:Normal  HLD   Neuro/Psych negative neurological ROS  negative psych ROS   GI/Hepatic negative GI ROS, (+) Hepatitis - (autoimmune, well controlled)  Endo/Other  negative endocrine ROS  Renal/GU negative Renal ROS  negative genitourinary   Musculoskeletal   Abdominal   Peds  Hematology negative hematology ROS (+)   Anesthesia Other Findings   Reproductive/Obstetrics negative OB ROS                             Anesthesia Physical Anesthesia Plan  ASA: 2  Anesthesia Plan: General   Post-op Pain Management:    Induction:   PONV Risk Score and Plan: 3 and TIVA  Airway Management Planned:   Additional Equipment:   Intra-op Plan:   Post-operative Plan:   Informed Consent: I have reviewed the patients History and Physical, chart, labs and discussed the procedure including the risks, benefits and alternatives for the proposed anesthesia with the patient or authorized representative who has indicated his/her understanding and acceptance.     Dental Advisory Given  Plan Discussed with: CRNA  Anesthesia Plan Comments:         Anesthesia Quick Evaluation

## 2021-01-07 NOTE — Anesthesia Postprocedure Evaluation (Signed)
Anesthesia Post Note  Patient: Jessica Zuniga  Procedure(s) Performed: COLONOSCOPY WITH PROPOFOL (Rectum) POLYPECTOMY (Rectum)     Patient location during evaluation: PACU Anesthesia Type: General Level of consciousness: awake and alert Pain management: pain level controlled Vital Signs Assessment: post-procedure vital signs reviewed and stable Respiratory status: spontaneous breathing, nonlabored ventilation and respiratory function stable Cardiovascular status: blood pressure returned to baseline and stable Postop Assessment: no apparent nausea or vomiting Anesthetic complications: no   No notable events documented.  April Manson

## 2021-01-08 ENCOUNTER — Encounter: Payer: Self-pay | Admitting: Gastroenterology

## 2021-01-08 LAB — SURGICAL PATHOLOGY

## 2021-01-09 ENCOUNTER — Encounter: Payer: Self-pay | Admitting: Gastroenterology

## 2021-01-09 ENCOUNTER — Encounter: Admit: 2021-01-09 | Discharge: 2021-01-09 | Payer: PRIVATE HEALTH INSURANCE

## 2021-01-09 ENCOUNTER — Encounter
Admit: 2021-01-09 | Discharge: 2021-01-09 | Payer: PRIVATE HEALTH INSURANCE | Attending: Gastroenterology | Primary: Gastroenterology

## 2021-01-09 DIAGNOSIS — K754 Autoimmune hepatitis: Secondary | ICD-10-CM | POA: Diagnosis not present

## 2021-01-09 DIAGNOSIS — K7460 Unspecified cirrhosis of liver: Secondary | ICD-10-CM | POA: Diagnosis not present

## 2021-01-09 DIAGNOSIS — Z6823 Body mass index (BMI) 23.0-23.9, adult: Secondary | ICD-10-CM | POA: Diagnosis not present

## 2021-01-09 LAB — CBC W/ AUTO DIFF
BASOPHILS ABSOLUTE COUNT: 0 10*9/L (ref 0.0–0.1)
BASOPHILS RELATIVE PERCENT: 0.4 %
EOSINOPHILS ABSOLUTE COUNT: 0.1 10*9/L (ref 0.0–0.5)
EOSINOPHILS RELATIVE PERCENT: 1.4 %
HEMATOCRIT: 38.7 % (ref 34.0–44.0)
HEMOGLOBIN: 13.3 g/dL (ref 11.3–14.9)
LYMPHOCYTES ABSOLUTE COUNT: 1.4 10*9/L (ref 1.1–3.6)
LYMPHOCYTES RELATIVE PERCENT: 28.4 %
MEAN CORPUSCULAR HEMOGLOBIN CONC: 34.3 g/dL (ref 32.0–36.0)
MEAN CORPUSCULAR HEMOGLOBIN: 31.6 pg (ref 25.9–32.4)
MEAN CORPUSCULAR VOLUME: 92.1 fL (ref 77.6–95.7)
MEAN PLATELET VOLUME: 7.7 fL (ref 6.8–10.7)
MONOCYTES ABSOLUTE COUNT: 0.4 10*9/L (ref 0.3–0.8)
MONOCYTES RELATIVE PERCENT: 7.6 %
NEUTROPHILS ABSOLUTE COUNT: 3 10*9/L (ref 1.8–7.8)
NEUTROPHILS RELATIVE PERCENT: 62.2 %
NUCLEATED RED BLOOD CELLS: 0 /100{WBCs} (ref <=4)
PLATELET COUNT: 177 10*9/L (ref 150–450)
RED BLOOD CELL COUNT: 4.2 10*12/L (ref 3.95–5.13)
RED CELL DISTRIBUTION WIDTH: 13.5 % (ref 12.2–15.2)
WBC ADJUSTED: 4.9 10*9/L (ref 3.6–11.2)

## 2021-01-09 LAB — LIPID PANEL
CHOLESTEROL/HDL RATIO SCREEN: 2.8 (ref 1.0–4.5)
CHOLESTEROL: 210 mg/dL — ABNORMAL HIGH (ref <=200)
HDL CHOLESTEROL: 74 mg/dL — ABNORMAL HIGH (ref 40–60)
LDL CHOLESTEROL CALCULATED: 121 mg/dL — ABNORMAL HIGH (ref 40–99)
NON-HDL CHOLESTEROL: 136 mg/dL — ABNORMAL HIGH (ref 70–130)
TRIGLYCERIDES: 77 mg/dL (ref 0–150)
VLDL CHOLESTEROL CAL: 15.4 mg/dL (ref 11–41)

## 2021-01-09 LAB — COMPREHENSIVE METABOLIC PANEL
ALBUMIN: 4.2 g/dL (ref 3.4–5.0)
ALKALINE PHOSPHATASE: 48 U/L (ref 46–116)
ALT (SGPT): 19 U/L (ref 10–49)
ANION GAP: 6 mmol/L (ref 5–14)
AST (SGOT): 18 U/L (ref <=34)
BILIRUBIN TOTAL: 0.6 mg/dL (ref 0.3–1.2)
BLOOD UREA NITROGEN: 12 mg/dL (ref 9–23)
BUN / CREAT RATIO: 18
CALCIUM: 9.9 mg/dL (ref 8.7–10.4)
CHLORIDE: 105 mmol/L (ref 98–107)
CO2: 27.4 mmol/L (ref 20.0–31.0)
CREATININE: 0.65 mg/dL
EGFR CKD-EPI (2021) FEMALE: >90 mL/min/{1.73_m2} (ref >=60)
GLUCOSE RANDOM: 105 mg/dL — ABNORMAL HIGH (ref 70–99)
POTASSIUM: 4.1 mmol/L (ref 3.4–4.8)
PROTEIN TOTAL: 6.6 g/dL (ref 5.7–8.2)
SODIUM: 138 mmol/L (ref 135–145)

## 2021-01-09 LAB — AFP TUMOR MARKER: AFP-TUMOR MARKER: 3 ng/mL (ref <=8)

## 2021-01-09 LAB — BILIRUBIN, DIRECT: BILIRUBIN DIRECT: 0.2 mg/dL (ref 0.00–0.30)

## 2021-01-09 MED ADMIN — gadobenate dimeglumine (MULTIHANCE) 529 mg/mL (0.1mmol/0.2mL) solution 7 mL: 7 mL | INTRAVENOUS | @ 13:00:00 | Stop: 2021-01-09

## 2021-01-09 NOTE — Unmapped (Addendum)
Autoimmune hepatitis, doing well on CellCept which we will continue indefinitely. Labs today. MRI from today is pending. Return to clinic in 3 months.

## 2021-01-16 NOTE — Unmapped (Signed)
[facility]    [name], M.D.  Professor of Medicine  Director, [facility]  [place] [facility] [place] [facility] [place]    [phone]    [name]    Referring MD: Midge Minium, MD      Chief complaint: Office follow-up for elevation of liver enzymes. Hospitalization (08/2016) for acute hepatitis of uncertain etiology, presumed autoimmune hepatitis treated with prednisone and mycophenylate. DRUG FEVER ASSOCIATED WITH AZATHIOPRINE. STEROIDS STOPPED 07/2017. CONTINUES ON CELLCEPT 1 GRAM BID FOR IMMUNOSUPPRESSION    Interval history: Patient continues to feel well. She denies fever, cough, sob, N/V, abdominal pain, chest pain, or skin rash.  She has been maintained on mycophenylate 1gram bid. No new health related concerns. She has received COVID vaccine in [place] x 2 and also the booster dose.  She returned from [place] about 2 weeks ago. She took Malaria prophylaxis that we had recommended- tolerated OK except GI distress which resolved after stopping meds.  Has had a 10-15 lb voluntary weight loss with diet and exercise.          Present illness at presentation:  Patient is a 60 y.o. Caucasian female who was hospitalized at [facility] between approximately February 12 -  August 29, 2016 with acute hepatitis. The patient had about 10 weeks of abnormal liver tests documented by her primary physician and her primary gastroenterologist. This was preceded by a short flu-like illness characterized by fatigue, muscle aches, and low-grade fever. All of the symptoms have completely remained resolved since her  Visit 09/10/2016. However she was left with elevated liver tests and had an extensive workup as an outpatient and subsequently as an inpatient at [facility] to further define the etiology of her liver disease.  Additional details of her history and prior outpatient evaluation as well as her inpatient evaluation are available from our initial consultation note and follow-up notes while she was hospitalized. After review of the liver biopsy and all serologic data thus far which has been unrevealing, it was felt that a therapeutic trial of prednisone for presumed seronegative autoimmune hepatitis was warranted. Prednisone  40 mg of daily started on 08/29/16. She had a good biochemical response with marked improvement in ALT, INR, including a slow but steady decrease in Total/direct bilirubin.     Patient had an extensive serological evaluation which was not revealing:  1. Antinuclear antibody and anti-smooth muscle antibody were negative. Immunoglobulin G was not elevated (1061)  2. Viral serologies including acute hepatitis A, hepatitis B, hepatitis C, HCV RNA, EBV PCR, CMV PCR, HHV-6 were all negative.  3. Liver ultrasound demonstrated patent vasculature with a nodular-appearing heterogeneous liver.  4. Percutaneous liver biopsy 08/2016 at [facility]:    A: Liver, core biopsy   - Severe active hepatitis with confluent areas of periportal hepatocellular necrosis (approximately 10-20% of sampled parenchyma) (see comment)   - Trichrome stain demonstrates at least periportal fibrosis and is suspicious for bridging fibrosis     Potential etiologies to consider include, but are not limited to, infection, drug/toxin-induced liver injury, seronegative autoimmune hepatitis, and metabolic disorders. The trichrome stain findings suggest that this process is at least subacute in duration.      Liver biopsy slides were personally reviewed by Dr. Sharon Mt  with the pathologist.  No viral inclusions were noted and only few plasma cells were visualized in the liver biopsy which was an adequate specimen. No cytopathic effect. EBV immunostains were negative. Fe in hepatocytes only grade 1/4  5.  Evaluation for Zoster: Dr. Foy Guadalajara received phone call from patient's primary GI MD that patient's Zoster IgM from 09/05/2016 was positive with titer 1.17 approximately one week after starting prednisone. Review of her record though revealed this test to have been negative on 08/29/2016.  Dr. Sharon Mt discussed these findings with Dr. Lytle Butte and the decision was made to proceed with patient starting Valtrex 500 mg bid.  She was seen by Dr. 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   Recommendations from ID: Continue valacyclovir 500 mg bid.  Advised readdressing stopping Valtrex if her repeat IgM is negative or once she is on <15 mg prednisone daily.  Valtrex was discontinued last week.  Bactrim prophylaxis while prednisone >15 mg q 24. 1 DS tablet M/W/F. The following vaccines were recommended for her to obtain under the care of her PCP: Twinrix, PCV13, and PPSV23. She should have these performed by her PCP when prednisone dose is  <20 mg daily.     Immunosuppressive history:  Patient was started on azathioprine for steroid sparing benefits in early April.  After 2 weeks patient developed fevers. ID workup negative during hospitalization.  Azathioprine was held during that hospitalization and fevers resolved. When she restarted azathioprine as outpatient, immediately had high fevers and shaking chills.  This rechallenge was highly suggestive of drug fever from azathioprine.    On 5/12 patient was started on CellCept 500mg  bid and continued on prednisone 15 mg daily.  On 12/03/16, liver enzymes remained elevated. Cellcept was increased to 1000mg  BID on 12/04/16. Prednisone tapered to off as of 07/2017.     10 sys ROS otherwise negative.     Past medical history:  Patient has generally been healthy until the current hepatitis began in December 2017.  1. No history of diabetes, coronary artery disease, hypertension, or lung disease.  2. No history of other autoimmune systemic diseases  3. FIBROSCAN 11/05/16: 17.7 kps c/w stage F4 (Patient ate breakfast 3 hours prior).  4. MRI 10/2017: HEPATOBILIARY: Nodular liver contour compatible with history of cirrhosis and similar to prior. Again seen is a geographic region of T1 hypointensity and corresponding slight T2 hyperintensity in the posterior right hepatic lobe which is overall similar in extent when compared to prior imaging. This region demonstrates progressive postcontrast enhancement as before. Overall similar degree of capsular retraction most pronounced along the right anterior margin of the liver (13:29). No discrete early arterially enhancing or washout liver lesion identified. No biliary ductal dilatation. Similar appearance and position of the borderline hydropic gallbladder.  PANCREAS: Pancreas divisum. No ductal dilatation.  5. MRI 06/2020 and 12/2020: Morphologic features consistent with history of cirrhosis. No sequela of portal hypertension.No MR evidence of HCC.  Similar appearing probable confluent fibrosis in the right hepatic lobe.     Allergies   Allergen Reactions   ??? Azathioprine Other (See Comments)     Fever  Other reaction(s): Other (See Comments)  Fever       Current Outpatient Medications   Medication Sig Dispense Refill   ??? mycophenolate (CELLCEPT) 500 mg tablet Take 2 tablets (1,000 mg total) by mouth 2 (two) times a day with meals. 360 tablet 3   ??? ibuprofen (MOTRIN) 600 MG tablet Take 600 mg by mouth every six (6) hours as needed.       No current facility-administered medications for this visit.     Social history: The patient is married.  She works as a Materials engineer. They have traveled extensively.   She does not drink any alcohol. She does not smoke cigarettes.    Family history: Negative for liver disease or liver cancer. Negative for autoimmune systemic diseases.    BP 150/77  - Pulse 64  - Temp 36.3 ??C (97.3 ??F) (Temporal)  - Wt 66.4 kg (146 lb 6.4 oz)  - SpO2 100%  - BMI 23.63 kg/m??     Pleasant individual in NAD    HEENT: Sclera are anicteric, no temporal muscle loss  NECK: No thyromegaly or lymphadenopathy, No carotid bruits  Chest: Clear to auscultation and percussion  Heart: S1, S2, RR, No murmurs  Abdomen: Soft, non-tender, non-distended, no hepatosplenomegaly, no masses appreciated, no ascites  Skin: No spider angiomata, No rashes  Extremities: Without pedal edema, no palmar erythema  Neuro: Grossly intact, No focal deficits      Results for orders placed or performed in visit on 01/09/21   Comprehensive Metabolic Panel   Result Value Ref Range    Sodium 138 135 - 145 mmol/L    Potassium 4.1 3.4 - 4.8 mmol/L    Chloride 105 98 - 107 mmol/L    CO2 27.4 20.0 - 31.0 mmol/L    Anion Gap 6 5 - 14 mmol/L    BUN 12 9 - 23 mg/dL    Creatinine 5.78 4.69 - 0.80 mg/dL    BUN/Creatinine Ratio 18     eGFR CKD-EPI (2021) Female >90 >=60 mL/min/1.64m2    Glucose 105 (H) 70 - 99 mg/dL    Calcium 9.9 8.7 - 62.9 mg/dL    Albumin 4.2 3.4 - 5.0 g/dL    Total Protein 6.6 5.7 - 8.2 g/dL    Total Bilirubin 0.6 0.3 - 1.2 mg/dL    AST 18 <=52 U/L    ALT 19 10 - 49 U/L    Alkaline Phosphatase 48 46 - 116 U/L   AFP tumor marker   Result Value Ref Range    AFP-Tumor Marker 3 <=8 ng/mL   Bilirubin, Direct   Result Value Ref Range    Bilirubin, Direct 0.20 0.00 - 0.30 mg/dL   Lipid panel   Result Value Ref Range    Triglycerides 77 0 - 150 mg/dL    Cholesterol 841 (H) <=200 mg/dL    HDL 74 (H) 40 - 60 mg/dL    LDL Calculated 324 (H) 40 - 99 mg/dL    VLDL Cholesterol Cal 15.4 11 - 41 mg/dL    Chol/HDL Ratio 2.8 1.0 - 4.5    Non-HDL Cholesterol 136 (H) 70 - 130 mg/dL    FASTING Yes    CBC w/ Differential   Result Value Ref Range    WBC 4.9 3.6 - 11.2 10*9/L    RBC 4.20 3.95 - 5.13 10*12/L    HGB 13.3 11.3 - 14.9 g/dL    HCT 40.1 02.7 - 25.3 %    MCV 92.1 77.6 - 95.7 fL    MCH 31.6 25.9 - 32.4 pg    MCHC 34.3 32.0 - 36.0 g/dL    RDW 66.4 40.3 - 47.4 %    MPV 7.7 6.8 - 10.7 fL    Platelet 177 150 - 450 10*9/L    nRBC 0 <=4 /100 WBCs    Neutrophils % 62.2 %    Lymphocytes % 28.4 %    Monocytes % 7.6 %    Eosinophils % 1.4 %    Basophils % 0.4 %  Absolute Neutrophils 3.0 1.8 - 7.8 10*9/L    Absolute Lymphocytes 1.4 1.1 - 3.6 10*9/L    Absolute Monocytes 0.4 0.3 - 0.8 10*9/L    Absolute Eosinophils 0.1 0.0 - 0.5 10*9/L    Absolute Basophils 0.0 0.0 - 0.1 10*9/L     MELD-Na score: 6 at 06/28/2020  2:35 PM  MELD score: 6 at 06/28/2020  2:35 PM  Calculated from:  Serum Creatinine: 0.74 mg/dL (Using min of 1 mg/dL) at 16/04/9603  5:40 PM  Serum Sodium: 139 mmol/L (Using max of 137 mmol/L) at 06/28/2020  2:35 PM  Total Bilirubin: 0.3 mg/dL (Using min of 1 mg/dL) at 98/05/9146  8:29 PM  INR(ratio): 1.0 at 06/28/2020  2:35 PM  Age: 59 years      Impression:  1. Seronegative autoimmune hepatitis based on response to immunosuppressive medication. The patient presented with severe hepatitis as evidenced by hyperbilirubinemia, elevation of her INR, decreased albumin, and results from her liver biopsy. Patient was started on a therapeutic trial of prednisone for the reasons discussed above. After only 4 days of immunosuppression, her liver enzymes (ALT)  that had been regularly elevated above 1000 decreased substantially.  This was highly suggestive of a positive effect of the prednisone and was c/w  presumed ANA-negative/SMA-negative autoimmune hepatitis.Labs showed continued normalization of bilirubin and INR, although she remained with mildly elevated ALT in the 40-45 range. CellCept was added on 5/12 and increased to 1000mg  BID on 12/04/16.Steroids tapered to off as of 07/2017.    We will maintain current level of CellCept.  ALT normal.  Patient feels completely well and labs stable.     2. Cirrhosis, well compensated:  FIBROSCAN suggestive of cirrhosis and MRI suggestive of nodular contour with focal fibrosis.  Liver biopsy during acute episode did demonstrate possible bridging fibrosis. Inflammation now resolved.Defer repeat liver biopsy but may reconsider after prolonged remission to reevaluate, as will not change management at this point.     3. HCC surveillance: Findings of cirrhosis as described above. 12/2020-MRI with evidence of cirrhosis. Unchanged area of focal fibrosis.  No evidence of enhancing mass. Unchanged from prior. Repeat in 6 months at next visit      4. Drug fevers from Azathioprine: resolved, no recurrent fever.    5. Mild thrombocytopenia: Resolved. Normal size spleen.     4. Twinrix x 3 completed.     5. . Vaccinations: Completed Shingrix. She will get pneumovax at PCP.     6. Lymphopenia: Resolved.    7. Incidental adnexal cyst seen on MRI: Benign after excision.     8. Vaccinated for COVID x 2 and boosted x 2.      RTC in 3 months.      [name], M.D.  Professor of Medicine  Director, [facility]  [place] [facility] [place] [facility] [place]    [phone]

## 2021-01-23 ENCOUNTER — Other Ambulatory Visit: Payer: Self-pay | Admitting: Internal Medicine

## 2021-01-23 DIAGNOSIS — Z1231 Encounter for screening mammogram for malignant neoplasm of breast: Secondary | ICD-10-CM

## 2021-01-25 NOTE — Unmapped (Signed)
[facility] Refill Coordination Note    Specialty Medication(s) to be Shipped:   Infectious Disease: Mycophenolate    Other medication(s) to be shipped: No additional medications requested for fill at this time     Margaret Berger, DOB: [birth year]  Phone: [phone] (home)       All above HIPAA information was verified with patient.     Was a Nurse, learning disability used for this call? No    Completed refill call assessment today to schedule patient's medication shipment from the [facility] [phone]).  All relevant notes have been reviewed.     Specialty medication(s) and dose(s) confirmed: Regimen is correct and unchanged.   Changes to medications: Margaret Berger reports no changes at this time.  Changes to insurance: No  New side effects reported not previously addressed with a pharmacist or physician: None reported  Questions for the pharmacist: No    Confirmed patient received a Conservation officer, historic buildings and a Surveyor, mining with first shipment. The patient will receive a drug information handout for each medication shipped and additional FDA Medication Guides as required.       DISEASE/MEDICATION-SPECIFIC INFORMATION        N/A    SPECIALTY MEDICATION ADHERENCE     Medication Adherence    Patient reported X missed doses in the last month: 0  Specialty Medication: mycophenolate 500mg               Were doses missed due to medication being on hold? No    Mycophenolate 500mg   : 7 days of medicine on hand       REFERRAL TO PHARMACIST     Referral to the pharmacist: Not needed      [facility]     Shipping address confirmed in Epic.     Delivery Scheduled: Yes, Expected medication delivery date: 7/20.     Medication will be delivered via UPS to the temporary address in Epic WAM.    [name]   [facility] Specialty Technician

## 2021-01-29 MED FILL — MYCOPHENOLATE MOFETIL 500 MG TABLET: ORAL | 30 days supply | Qty: 120 | Fill #4

## 2021-01-31 ENCOUNTER — Ambulatory Visit
Admission: RE | Admit: 2021-01-31 | Discharge: 2021-01-31 | Disposition: A | Discharge status: Home / Self Care | Payer: BC Managed Care – PPO | Source: Ambulatory Visit | Attending: Internal Medicine | Admitting: Internal Medicine

## 2021-01-31 ENCOUNTER — Other Ambulatory Visit: Payer: Self-pay

## 2021-01-31 DIAGNOSIS — Z1231 Encounter for screening mammogram for malignant neoplasm of breast: Secondary | ICD-10-CM

## 2021-02-22 NOTE — Unmapped (Signed)
[facility] Refill Coordination Note    Specialty Medication(s) to be Shipped:   Infectious Disease: Mycophenolate    Other medication(s) to be shipped: No additional medications requested for fill at this time     Margaret Berger, DOB: [birth year]  Phone: [phone] (home)       All above HIPAA information was verified with patient.     Was a Nurse, learning disability used for this call? No    Completed refill call assessment today to schedule patient's medication shipment from the [facility] [phone]).  All relevant notes have been reviewed.     Specialty medication(s) and dose(s) confirmed: Regimen is correct and unchanged.   Changes to medications: Margaret Berger reports no changes at this time.  Changes to insurance: No  New side effects reported not previously addressed with a pharmacist or physician: None reported  Questions for the pharmacist: No    Confirmed patient received a Conservation officer, historic buildings and a Surveyor, mining with first shipment. The patient will receive a drug information handout for each medication shipped and additional FDA Medication Guides as required.       DISEASE/MEDICATION-SPECIFIC INFORMATION        N/A    SPECIALTY MEDICATION ADHERENCE     Medication Adherence    Patient reported X missed doses in the last month: 0  Specialty Medication: mycophenolate 500mg               Were doses missed due to medication being on hold? No    Mycophenolate 500mg   : 10 days of medicine on hand       REFERRAL TO PHARMACIST     Referral to the pharmacist: Not needed      [facility]     Shipping address confirmed in Epic.     Delivery Scheduled: Yes, Expected medication delivery date: 8/17.     Medication will be delivered via UPS to the temporary address in Epic WAM.    Margaret Berger   [facility] Specialty Technician

## 2021-02-26 MED FILL — MYCOPHENOLATE MOFETIL 500 MG TABLET: ORAL | 30 days supply | Qty: 120 | Fill #5

## 2021-03-26 DIAGNOSIS — K754 Autoimmune hepatitis: Principal | ICD-10-CM

## 2021-03-26 NOTE — Unmapped (Signed)
F/u on email below. Labs will be ordered for [facility].  Lanora Manis, RN

## 2021-03-27 NOTE — Unmapped (Signed)
[facility] Refill Coordination Note    Specialty Medication(s) to be Shipped:   Infectious Disease: Mycophenolate    Other medication(s) to be shipped: No additional medications requested for fill at this time     Margaret Berger, DOB: 19-Feb-1961  Phone: [phone] (home)       All above HIPAA information was verified with patient.     Was a Nurse, learning disability used for this call? No    Completed refill call assessment today to schedule patient's medication shipment from the [facility] [phone]).  All relevant notes have been reviewed.     Specialty medication(s) and dose(s) confirmed: Regimen is correct and unchanged.   Changes to medications: Margaret Berger reports no changes at this time.  Changes to insurance: No  New side effects reported not previously addressed with a pharmacist or physician: None reported  Questions for the pharmacist: No    Confirmed patient received a Conservation officer, historic buildings and a Surveyor, mining with first shipment. The patient will receive a drug information handout for each medication shipped and additional FDA Medication Guides as required.       DISEASE/MEDICATION-SPECIFIC INFORMATION        N/A    SPECIALTY MEDICATION ADHERENCE     Medication Adherence    Patient reported X missed doses in the last month: 0  Specialty Medication: mycophenolate 500mg               Were doses missed due to medication being on hold? No    Mycophenolate 500mg   : 7 days of medicine on hand       REFERRAL TO PHARMACIST     Referral to the pharmacist: Not needed      [facility]     Shipping address confirmed in Epic.     Delivery Scheduled: Yes, Expected medication delivery date: 9/20.     Medication will be delivered via UPS to the temporary address in Epic WAM.    [name]   [facility] Specialty Technician

## 2021-04-01 MED FILL — MYCOPHENOLATE MOFETIL 500 MG TABLET: ORAL | 30 days supply | Qty: 120 | Fill #6

## 2021-04-02 DIAGNOSIS — K754 Autoimmune hepatitis: Secondary | ICD-10-CM | POA: Diagnosis not present

## 2021-04-03 ENCOUNTER — Ambulatory Visit
Admit: 2021-04-03 | Discharge: 2021-04-04 | Payer: PRIVATE HEALTH INSURANCE | Attending: Gastroenterology | Primary: Gastroenterology

## 2021-04-03 DIAGNOSIS — K754 Autoimmune hepatitis: Secondary | ICD-10-CM | POA: Diagnosis not present

## 2021-04-03 LAB — CBC W/ DIFFERENTIAL
BANDED NEUTROPHILS ABSOLUTE COUNT: 0 10*3/uL (ref 0.0–0.1)
BASOPHILS ABSOLUTE COUNT: 0 10*3/uL (ref 0.0–0.2)
BASOPHILS RELATIVE PERCENT: 1 %
EOSINOPHILS ABSOLUTE COUNT: 0.1 10*3/uL (ref 0.0–0.4)
EOSINOPHILS RELATIVE PERCENT: 2 %
HEMATOCRIT: 40.2 % (ref 34.0–46.6)
HEMOGLOBIN: 13.5 g/dL (ref 11.1–15.9)
IMMATURE GRANULOCYTES: 0 %
LYMPHOCYTES ABSOLUTE COUNT: 1.7 10*3/uL (ref 0.7–3.1)
LYMPHOCYTES RELATIVE PERCENT: 28 %
MEAN CORPUSCULAR HEMOGLOBIN CONC: 33.6 g/dL (ref 31.5–35.7)
MEAN CORPUSCULAR HEMOGLOBIN: 31.4 pg (ref 26.6–33.0)
MEAN CORPUSCULAR VOLUME: 94 fL (ref 79–97)
MONOCYTES ABSOLUTE COUNT: 0.4 10*3/uL (ref 0.1–0.9)
MONOCYTES RELATIVE PERCENT: 6 %
NEUTROPHILS ABSOLUTE COUNT: 3.9 10*3/uL (ref 1.4–7.0)
NEUTROPHILS RELATIVE PERCENT: 63 %
PLATELET COUNT: 187 10*3/uL (ref 150–450)
RED BLOOD CELL COUNT: 4.3 x10E6/uL (ref 3.77–5.28)
RED CELL DISTRIBUTION WIDTH: 12.4 % (ref 11.7–15.4)
WHITE BLOOD CELL COUNT: 6.1 10*3/uL (ref 3.4–10.8)

## 2021-04-03 LAB — AFP TUMOR MARKER: AFP-TUMOR MARKER: 4.4 ng/mL (ref 0.0–9.2)

## 2021-04-03 LAB — COMPREHENSIVE METABOLIC PANEL
A/G RATIO: 2.3 — ABNORMAL HIGH (ref 1.2–2.2)
ALBUMIN: 4.9 g/dL — ABNORMAL HIGH (ref 3.8–4.8)
ALKALINE PHOSPHATASE: 60 IU/L (ref 44–121)
ALT (SGPT): 17 IU/L (ref 0–32)
AST (SGOT): 19 IU/L (ref 0–40)
BILIRUBIN TOTAL: 0.3 mg/dL (ref 0.0–1.2)
BLOOD UREA NITROGEN: 16 mg/dL (ref 8–27)
BUN / CREAT RATIO: 20 (ref 12–28)
CALCIUM: 9.9 mg/dL (ref 8.7–10.3)
CHLORIDE: 102 mmol/L (ref 96–106)
CO2: 26 mmol/L (ref 20–29)
CREATININE: 0.79 mg/dL (ref 0.57–1.00)
EGFR: 85 mL/min/{1.73_m2}
GLOBULIN, TOTAL: 2.1 g/dL (ref 1.5–4.5)
GLUCOSE: 124 mg/dL — ABNORMAL HIGH (ref 65–99)
POTASSIUM: 5 mmol/L (ref 3.5–5.2)
SODIUM: 143 mmol/L (ref 134–144)
TOTAL PROTEIN: 7 g/dL (ref 6.0–8.5)

## 2021-04-03 LAB — PROTIME-INR
INR: 1 (ref 0.9–1.2)
PROTHROMBIN TIME: 10.3 s (ref 9.1–12.0)

## 2021-04-03 LAB — BILIRUBIN, DIRECT: BILIRUBIN DIRECT: 0.1 mg/dL (ref 0.00–0.40)

## 2021-04-03 NOTE — Unmapped (Addendum)
Autoimmune hepatitis with early cirrhosis. In remission on Cellcept which we will continue indefinitely. Labs looked fine. Check hemoglobin A1C next visit. MRI ordered at time of next visit. Upper endoscopy ordered to evaluate for esophageal varices.

## 2021-04-05 NOTE — Unmapped (Signed)
EGD  Procedure #1      0  Procedure #2      [id number]  MRN      Generic  Endoscopist      FALSE  Urgent procedure      FALSE  Do you take: Plavix, Coumadin, Lovenox, Pradaxa, Effient, Xarelto, Eliquis, Pletal, or Brilinta?      FALSE  Do you have hemophilia, von Willebrand disease, thrombocytopenia?      FALSE  Do you have a pacemaker or implanted cardiac defibrillator?      FALSE  Are you pregnant?      FALSE  Has a [facility] provider specified the location(s)?        Which location(s) did the [facility] provider specify?      FALSE     [facility]      FALSE     [place]      FALSE     [facility]      FALSE     [facility]Mod Sedation      FALSE  Is procedure indication for variceal banding?      FALSE  Do you have sleep apnea or wear a CPAP machine at night?      5  Height (feet)      5  Height (inches)      145  Weight (pounds)      24.1  BMI              FALSE  Do you have chronic kidney disease?      FALSE  Do you have chronic constipation or have you had poor quality bowel preps for past colonoscopies?      FALSE  Do you have Crohn's disease or ulcerative colitis?      FALSE  Have you had weight loss surgery?              FALSE  When you walk around your house or grocery store, do you have to stop and rest due to shortness of breath, chest pain, or light-headedness?      FALSE  Are you in the process of scheduling a heart ultrasound, stress test, or catheterization for a new problem?      FALSE  Have you had a heart attack, stroke or heart stent placement within the past 6 months?      FALSE  Do you ever use supplemental oxygen?      FALSE  Have you been hospitalized for cirrhosis of the liver or heart failure in the last 12 months?      FALSE  Have you been treated for mouth or throat cancer with radiation or surgery?      FALSE  Have you been told that it is difficult for doctors to insert a breathing tube in you during anesthesia?      FALSE  Have you had a heart or lung transplant?              FALSE  Are you on dialysis?      TRUE  Do you have cirrhosis of the liver?      TRUE  Do you have myasthenia gravis?      FALSE  Is the patient a prisoner?              FALSE  Are you younger than 30?      FALSE  Have you previously received propofol sedation administered by an anesthesiologist for a GI procedure?  FALSE  Do you drink an average of more than 3 drinks of alcohol per day?      FALSE  Do you regularly take prescription medications for chronic pain?      FALSE  Do you regularly take Ativan, Klonopin, Xanax, Valium, lorazepam, clonazepam, alprazolam, or diazepam?      FALSE  Have you previously had difficulty with sedation during a GI procedure?      FALSE  Have you been diagnosed with PTSD?      FALSE  Are you allergic to fentanyl or midazolam (Versed)?      FALSE  Do you take medications for HIV?      ************************ ************************ ************************ ************************   MRN:          [id number]      Anticoag Review:  No      Nurse Triage:  No      GI Clinic Consult:  No      Procedure(s):  EGD 0     [facility]  [facility]      Endoscopist:  Generic      Urgent:            No       Prep:                     ************************ ************************ ************************ ************************

## 2021-04-07 NOTE — Unmapped (Signed)
[facility]    [name], M.D.  Professor of Medicine  Director, [facility]  [place] [facility] [place] [facility] [place]    [phone]    [name]    Referring MD: Midge Minium, MD      Chief complaint: Office follow-up for elevation of liver enzymes. Hospitalization (08/2016) for acute hepatitis of uncertain etiology, presumed autoimmune hepatitis treated with prednisone and mycophenylate. DRUG FEVER ASSOCIATED WITH AZATHIOPRINE. STEROIDS STOPPED 07/2017. CONTINUES ON CELLCEPT 1 GRAM BID FOR IMMUNOSUPPRESSION    Interval history: Patient continues to feel well. She denies fever, cough, sob, N/V, abdominal pain, chest pain, or skin rash.  She has been maintained on mycophenylate 1gram bid. No new health related concerns. She has received COVID vaccine in [place] x 2 and also the booster dose x 2.      Present illness at presentation:  Patient is a 60 y.o. Caucasian female who was hospitalized at [facility] between approximately February 12 -  August 29, 2016 with acute hepatitis. The patient had about 10 weeks of abnormal liver tests documented by her primary physician and her primary gastroenterologist. This was preceded by a short flu-like illness characterized by fatigue, muscle aches, and low-grade fever. All of the symptoms have completely remained resolved since her  Visit 09/10/2016. However she was left with elevated liver tests and had an extensive workup as an outpatient and subsequently as an inpatient at [facility] to further define the etiology of her liver disease.  Additional details of her history and prior outpatient evaluation as well as her inpatient evaluation are available from our initial consultation note and follow-up notes while she was hospitalized.       After review of the liver biopsy and all serologic data thus far which has been unrevealing, it was felt that a therapeutic trial of prednisone for presumed seronegative autoimmune hepatitis was warranted. Prednisone  40 mg of daily started on 08/29/16. She had a good biochemical response with marked improvement in ALT, INR, including a slow but steady decrease in Total/direct bilirubin.     Patient had an extensive serological evaluation which was not revealing:  1. Antinuclear antibody and anti-smooth muscle antibody were negative. Immunoglobulin G was not elevated (1061)  2. Viral serologies including acute hepatitis A, hepatitis B, hepatitis C, HCV RNA, EBV PCR, CMV PCR, HHV-6 were all negative.  3. Liver ultrasound demonstrated patent vasculature with a nodular-appearing heterogeneous liver.  4. Percutaneous liver biopsy 08/2016 at [facility]:    A: Liver, core biopsy   - Severe active hepatitis with confluent areas of periportal hepatocellular necrosis (approximately 10-20% of sampled parenchyma) (see comment)   - Trichrome stain demonstrates at least periportal fibrosis and is suspicious for bridging fibrosis     Potential etiologies to consider include, but are not limited to, infection, drug/toxin-induced liver injury, seronegative autoimmune hepatitis, and metabolic disorders. The trichrome stain findings suggest that this process is at least subacute in duration.      Liver biopsy slides were personally reviewed by Dr. Sharon Mt  with the pathologist.  No viral inclusions were noted and only few plasma cells were visualized in the liver biopsy which was an adequate specimen. No cytopathic effect. EBV immunostains were negative. Fe in hepatocytes only grade 1/4    5.  Evaluation for Zoster: Dr. Foy Guadalajara received phone call from patient's primary GI MD that patient's Zoster IgM from 09/05/2016 was positive with titer 1.17 approximately one week after starting prednisone. Review of  her record though revealed this test to have been negative on 08/29/2016.  Dr. Sharon Mt discussed these findings with Dr. Lytle Butte and the decision was made to proceed with patient starting Valtrex 500 mg bid.  She was seen by Dr. 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   Recommendations from ID: Continue valacyclovir 500 mg bid.  Advised readdressing stopping Valtrex if her repeat IgM is negative or once she is on <15 mg prednisone daily.  Valtrex was discontinued last week.  Bactrim prophylaxis while prednisone >15 mg q 24. 1 DS tablet M/W/F. The following vaccines were recommended for her to obtain under the care of her PCP: Twinrix, PCV13, and PPSV23. She should have these performed by her PCP when prednisone dose is  <20 mg daily.     Immunosuppressive history:  Patient was started on azathioprine for steroid sparing benefits in early April.  After 2 weeks patient developed fevers. ID workup negative during hospitalization.  Azathioprine was held during that hospitalization and fevers resolved. When she restarted azathioprine as outpatient, immediately had high fevers and shaking chills.  This rechallenge was highly suggestive of drug fever from azathioprine.    On 5/12 patient was started on CellCept 500mg  bid and continued on prednisone 15 mg daily.  On 12/03/16, liver enzymes remained elevated. Cellcept was increased to 1000mg  BID on 12/04/16. Prednisone tapered to off as of 07/2017.     10 sys ROS otherwise negative.     Past medical history:  Patient has generally been healthy until the current hepatitis began in December 2017.  1. No history of diabetes, coronary artery disease, hypertension, or lung disease.  2. No history of other autoimmune systemic diseases  3. Excision of incidental benign adnexal cyst  3. FIBROSCAN 11/05/16: 17.7 kps c/w stage F4 (Patient ate breakfast 3 hours prior).  4. MRI 10/2017: HEPATOBILIARY: Nodular liver contour compatible with history of cirrhosis and similar to prior. Again seen is a geographic region of T1 hypointensity and corresponding slight T2 hyperintensity in the posterior right hepatic lobe which is overall similar in extent when compared to prior imaging. This region demonstrates progressive postcontrast enhancement as before. Overall similar degree of capsular retraction most pronounced along the right anterior margin of the liver (13:29). No discrete early arterially enhancing or washout liver lesion identified. No biliary ductal dilatation. Similar appearance and position of the borderline hydropic gallbladder.  PANCREAS: Pancreas divisum. No ductal dilatation.  5. MRI 06/2020 and 12/2020: Morphologic features consistent with history of cirrhosis. No sequela of portal hypertension.No MR evidence of HCC.  Similar appearing probable confluent fibrosis in the right hepatic lobe.     Allergies   Allergen Reactions   ??? Azathioprine Other (See Comments)     Fever  Other reaction(s): Other (See Comments)  Fever       Current Outpatient Medications   Medication Sig Dispense Refill   ??? mycophenolate (CELLCEPT) 500 mg tablet Take 2 tablets (1,000 mg total) by mouth 2 (two) times a day with meals. 360 tablet 3   ??? ibuprofen (MOTRIN) 600 MG tablet Take 600 mg by mouth every six (6) hours as needed.       No current facility-administered medications for this visit.     Social history: The patient is married. She works as a Materials engineer. They have traveled extensively.   She does not drink any alcohol. She does not smoke cigarettes.  Family history: Negative for liver disease or liver cancer. Negative for autoimmune systemic diseases.    BP 122/73  - Pulse 75  - Temp 35.8 ??C (96.4 ??F) (Temporal)  - Wt 67.4 kg (148 lb 9.6 oz)  - SpO2 99%  - BMI 23.98 kg/m??     Pleasant individual in NAD    HEENT: Sclera are anicteric, no temporal muscle loss  NECK: No thyromegaly or lymphadenopathy, No carotid bruits  Chest: Clear to auscultation and percussion  Heart: S1, S2, RR, No murmurs  Abdomen: Soft, non-tender, non-distended, no hepatosplenomegaly, no masses appreciated, no ascites  Skin: No spider angiomata, No rashes  Extremities: Without pedal edema, no palmar erythema  Neuro: Grossly intact, No focal deficits      Results for orders placed or performed in visit on 03/26/21   CBC w/ Differential   Result Value Ref Range    WBC 6.1 3.4 - 10.8 x10E3/uL    RBC 4.30 3.77 - 5.28 x10E6/uL    HGB 13.5 11.1 - 15.9 g/dL    HCT 16.1 09.6 - 04.5 %    MCV 94 79 - 97 fL    MCH 31.4 26.6 - 33.0 pg    MCHC 33.6 31.5 - 35.7 g/dL    RDW 40.9 81.1 - 91.4 %    Platelet 187 150 - 450 x10E3/uL    Neutrophils % 63 Not Estab. %    Lymphocytes % 28 Not Estab. %    Monocytes % 6 Not Estab. %    Eosinophils % 2 Not Estab. %    Basophils % 1 Not Estab. %    Absolute Neutrophils 3.9 1.4 - 7.0 x10E3/uL    Absolute Lymphocytes 1.7 0.7 - 3.1 x10E3/uL    Absolute Monocytes  0.4 0.1 - 0.9 x10E3/uL    Absolute Eosinophils 0.1 0.0 - 0.4 x10E3/uL    Absolute Basophils  0.0 0.0 - 0.2 x10E3/uL    Immature Granulocytes 0 Not Estab. %    Bands Absolute 0.0 0.0 - 0.1 x10E3/uL   Comprehensive metabolic panel   Result Value Ref Range    Glucose 124 (H) 65 - 99 mg/dL    BUN 16 8 - 27 mg/dL    Creatinine 7.82 9.56 - 1.00 mg/dL    eGFR 85 >21 HY/QMV/7.84    BUN/Creatinine Ratio 20 12 - 28    Sodium 143 134 - 144 mmol/L    Potassium 5.0 3.5 - 5.2 mmol/L    Chloride 102 96 - 106 mmol/L    CO2 26 20 - 29 mmol/L    Calcium 9.9 8.7 - 10.3 mg/dL    Total Protein 7.0 6.0 - 8.5 g/dL    Albumin 4.9 (H) 3.8 - 4.8 g/dL    Globulin, Total 2.1 1.5 - 4.5 g/dL    A/G Ratio 2.3 (H) 1.2 - 2.2    Total Bilirubin 0.3 0.0 - 1.2 mg/dL    Alkaline Phosphatase 60 44 - 121 IU/L    AST 19 0 - 40 IU/L    ALT 17 0 - 32 IU/L   Bilirubin, Direct   Result Value Ref Range    Bilirubin, Direct 0.10 0.00 - 0.40 mg/dL   PT-INR   Result Value Ref Range    INR 1.0 0.9 - 1.2    Prothrombin Time 10.3 9.1 - 12.0 sec   AFP tumor marker   Result Value Ref Range    AFP-Tumor Marker 4.4 0.0 - 9.2 ng/mL     MELD-Na score:  6 at 04/02/2021  1:46 PM  MELD score: 6 at 04/02/2021  1:46 PM  Calculated from:  Serum Creatinine: 0.79 mg/dL (Using min of 1 mg/dL) at 1/61/0960  4:54 PM  Serum Sodium: 143 mmol/L (Using max of 137 mmol/L) at 04/02/2021  1:46 PM  Total Bilirubin: 0.3 mg/dL (Using min of 1 mg/dL) at 0/98/1191  4:78 PM  INR(ratio): 1.0 at 04/02/2021  1:46 PM  Age: 8 years      Impression:  1. Seronegative autoimmune hepatitis based on response to immunosuppressive medication. The patient presented with severe hepatitis as evidenced by hyperbilirubinemia, elevation of her INR, decreased albumin, and results from her liver biopsy. Patient was started on a therapeutic trial of prednisone for the reasons discussed above. After only 4 days of immunosuppression, her liver enzymes (ALT)  that had been regularly elevated above 1000 decreased substantially.  This was highly suggestive of a positive effect of the prednisone and was c/w  presumed ANA-negative/SMA-negative autoimmune hepatitis.Labs showed continued normalization of bilirubin and INR, although she remained with mildly elevated ALT in the 40-45 range. CellCept was added on 5/12 and increased to 1000mg  BID on 12/04/16.Steroids tapered to off as of 07/2017.    We will maintain current level of CellCept.  ALT normal.  Patient feels completely well and labs stable.     2. Cirrhosis, well compensated:  FIBROSCAN suggestive of cirrhosis and MRI suggestive of nodular contour with focal fibrosis.  Liver biopsy during acute episode did demonstrate possible bridging fibrosis. Inflammation now resolved.    3. HCC surveillance: Findings of cirrhosis as described above. 12/2020-MRI with evidence of cirrhosis. Unchanged area of focal fibrosis.  No evidence of enhancing mass. Unchanged from prior. Repeat in 6 months at next visit.      4. Evaluate portal HTN: MRI from 6/22 showed some changes c/w increasing portal HTN despite remission of autoimmune hepatitis. Cirrhotic stigmata with evidence of increased portal pressures including recannulized umbilical vein and small caliber upper abdominal varices. Will proceed with EGD to exclude esophageal varices.      5. Elevated non-fasting glucose- check HgbA1C next visit if not done by PCP.     RTC in 3 months with MRI for [facility] surveillance (ordered).      [name], M.D.  Professor of Medicine  Director, [facility]  [place] [facility] [place] [facility] [place]    [phone]

## 2021-04-26 NOTE — Unmapped (Signed)
[facility] Refill Coordination Note    Specialty Medication(s) to be Shipped:   Infectious Disease: Mycophenolate    Other medication(s) to be shipped: No additional medications requested for fill at this time     [name], DOB: 1961/04/17  Phone: [phone] (home)       All above HIPAA information was verified with patient.     Was a Nurse, learning disability used for this call? No    Completed refill call assessment today to schedule patient's medication shipment from the [facility] [phone]).  All relevant notes have been reviewed.     Specialty medication(s) and dose(s) confirmed: Regimen is correct and unchanged.   Changes to medications: Janera reports no changes at this time.  Changes to insurance: No  New side effects reported not previously addressed with a pharmacist or physician: None reported  Questions for the pharmacist: No    Confirmed patient received a Conservation officer, historic buildings and a Surveyor, mining with first shipment. The patient will receive a drug information handout for each medication shipped and additional FDA Medication Guides as required.       DISEASE/MEDICATION-SPECIFIC INFORMATION        N/A    SPECIALTY MEDICATION ADHERENCE     Medication Adherence    Patient reported X missed doses in the last month: 0  Specialty Medication: mycophenolate 500mg               Were doses missed due to medication being on hold? No    Mycophenolate 500mg   : 10 days of medicine on hand       REFERRAL TO PHARMACIST     Referral to the pharmacist: Not needed      [facility]     Shipping address confirmed in Epic.     Delivery Scheduled: Yes, Expected medication delivery date: 10/18.     Medication will be delivered via UPS to the temporary address in Epic WAM.    [name]   [facility] Specialty Technician

## 2021-04-29 MED FILL — MYCOPHENOLATE MOFETIL 500 MG TABLET: ORAL | 30 days supply | Qty: 120 | Fill #7

## 2021-06-03 MED FILL — MYCOPHENOLATE MOFETIL 500 MG TABLET: ORAL | 30 days supply | Qty: 120 | Fill #8

## 2021-06-03 NOTE — Unmapped (Signed)
[facility] Pharmacy Clinical Assessment & Refill Coordination Note    Margaret Berger, DOB: [birth year]  Phone: [phone] (home)     All above HIPAA information was verified with patient.     Was a Nurse, learning disability used for this call? No    Specialty Medication(s):   Inflammatory Disorders: mycophenolate     Current Outpatient Medications   Medication Sig Dispense Refill   ??? mycophenolate (CELLCEPT) 500 mg tablet Take 2 tablets (1,000 mg total) by mouth 2 (two) times a day with meals. 360 tablet 3     No current facility-administered medications for this visit.        Changes to medications: Conna reports no changes at this time.    Allergies   Allergen Reactions   ??? Azathioprine Other (See Comments)     Fever  Other reaction(s): Other (See Comments)  Fever       Changes to allergies: No    SPECIALTY MEDICATION ADHERENCE     Mycophenolate mofetil 500 mg: 6 days of medicine on hand       Medication Adherence    Patient reported X missed doses in the last month: 0  Specialty Medication: mycophenolate mofetil 500mg   Patient is on additional specialty medications: No  Any gaps in refill history greater than 2 weeks in the last 3 months: no  Demonstrates understanding of importance of adherence: yes  Informant: patient  Provider-estimated medication adherence level: good  Patient is at risk for Non-Adherence: No          Specialty medication(s) dose(s) confirmed: Regimen is correct and unchanged.     Are there any concerns with adherence? No    Adherence counseling provided? Not needed    CLINICAL MANAGEMENT AND INTERVENTION      Clinical Benefit Assessment:    Do you feel the medicine is effective or helping your condition? Yes     Labs:      Latest Reference Range & Units Most Recent   Sodium 134 - 144 mmol/L 143  04/02/21 13:46   Potassium 3.5 - 5.2 mmol/L 5.0  04/02/21 13:46   Chloride 96 - 106 mmol/L 102  04/02/21 13:46   CO2 20 - 29 mmol/L 26  04/02/21 13:46   Bun 8 - 27 mg/dL 16  0/98/11 91:47 Creatinine 0.57 - 1.00 mg/dL 8.29  5/62/13 08:65   BUN/Creatinine Ratio 12 - 28  20  04/02/21 13:46   eGFR CKD-EPI (2021) Female >=60 mL/min/1.22m2 >90  01/09/21 09:46   EGFR MDRD Non Af Amer >=60 mL/min/1.62m2 >=60  11/02/17 09:54   EGFR MDRD Af Amer >=60 mL/min/1.74m2 >=60  11/02/17 09:54   EGFR CKD-EPI African American, Female >=60 mL/min/1.54m2 >90  07/08/20 12:55   EGFR CKD-EPI Non-African American, Female >=60 mL/min/1.21m2 >90  07/08/20 12:55   Anion Gap 5 - 14 mmol/L 6  01/09/21 09:46   Glucose 65 - 99 mg/dL 784 (H)  6/96/29 52:84   Calcium 8.7 - 10.3 mg/dL 9.9  1/32/44 01:02   Albumin 3.8 - 4.8 g/dL 4.9 (H)  02/05/35 64:40   Total Protein 5.7 - 8.2 g/dL 6.6  3/47/42 59:56   Total Protein 6.0 - 8.5 g/dL 7.0  3/87/56 43:32   Globulin, Total 1.5 - 4.5 g/dL 2.1  9/51/88 41:66   A/G Ratio 1.2 - 2.2  2.3 (H)  04/02/21 13:46   Total Bilirubin 0.0 - 1.2 mg/dL 0.3  0/63/01 60:10   Bilirubin, Direct 0.00 - 0.40 mg/dL 9.32  04/02/21 13:46   AST 0 - 40 IU/L 19  04/02/21 13:46   ALT 0 - 32 IU/L 17  04/02/21 13:46   Alkaline Phosphatase 44 - 121 IU/L 60  04/02/21 13:46   GGT 11 - 48 U/L 60 (H)  02/10/18 09:56   Lipase 44 - 232 U/L 206  10/29/16 12:56   Creatinine Whole Blood, POC 0.7 - 1.1 mg/dL 0.7  1/61/09 60:45   EGFR CKD-EPI African American Female >=60 mL/min/1.47m2 >90  12/05/19 10:27   EGFR CKD-EPI Non-African American Female >=60 mL/min/1.60m2 >90  12/05/19 10:27   (H): Data is abnormally high  Clinical Benefit counseling provided? Labs from 04/02/21 show evidence of clinical benefit    Adverse Effects Assessment:    Are you experiencing any side effects? No    Are you experiencing difficulty administering your medicine? No    Quality of Life Assessment:    How many days over the past month did your autoimmune hepatitis  keep you from your normal activities? For example, brushing your teeth or getting up in the morning. 0    Have you discussed this with your provider? Not needed    Acute Infection Status:    Acute infections noted within Epic:  No active infections  Patient reported infection: None    Therapy Appropriateness:    Is therapy appropriate and patient progressing towards therapeutic goals? Yes, therapy is appropriate and should be continued    DISEASE/MEDICATION-SPECIFIC INFORMATION      N/A    PATIENT SPECIFIC NEEDS     - Does the patient have any physical, cognitive, or cultural barriers? No    - Is the patient high risk? Yes, patient is taking a REMS drug. Medication is dispensed in compliance with REMS program    - Does the patient require a Care Management Plan? No     - Does the patient require physician intervention or other additional services (i.e. nutrition, smoking cessation, social work)? No      SHIPPING     Specialty Medication(s) to be Shipped:   Inflammatory Disorders: mycophenolate    Other medication(s) to be shipped: No additional medications requested for fill at this time     Changes to insurance: No    Delivery Scheduled: Yes, Expected medication delivery date: 06/03/21.     Medication will be delivered via Same Day Courier to the confirmed prescription address in [facility].    The patient will receive a drug information handout for each medication shipped and additional FDA Medication Guides as required.  Verified that patient has previously received a Conservation officer, historic buildings and a Surveyor, mining.    The patient or caregiver noted above participated in the development of this care plan and knows that they can request review of or adjustments to the care plan at any time.      All of the patient's questions and concerns have been addressed.    Roderic Palau   [facility] Pharm[facility] Specialty Pharmacist

## 2021-06-14 ENCOUNTER — Ambulatory Visit: Admit: 2021-06-14 | Discharge: 2021-06-14 | Payer: PRIVATE HEALTH INSURANCE

## 2021-06-14 ENCOUNTER — Encounter
Admit: 2021-06-14 | Discharge: 2021-06-14 | Payer: PRIVATE HEALTH INSURANCE | Attending: Student in an Organized Health Care Education/Training Program | Primary: Student in an Organized Health Care Education/Training Program

## 2021-06-14 DIAGNOSIS — K7460 Unspecified cirrhosis of liver: Secondary | ICD-10-CM | POA: Diagnosis not present

## 2021-06-14 DIAGNOSIS — K2100 Gastro-esophageal reflux disease with esophagitis, without bleeding: Secondary | ICD-10-CM | POA: Diagnosis not present

## 2021-06-14 MED ADMIN — propofoL (DIPRIVAN) injection: INTRAVENOUS | @ 19:00:00 | Stop: 2021-06-14

## 2021-06-14 MED ADMIN — lidocaine (XYLOCAINE) 20 mg/mL (2 %) injection: INTRAVENOUS | @ 19:00:00 | Stop: 2021-06-14

## 2021-06-14 MED ADMIN — sodium chloride (NS) 0.9 % infusion: 10 mL/h | INTRAVENOUS | @ 19:00:00 | Stop: 2021-06-14

## 2021-06-27 NOTE — Unmapped (Signed)
[facility] Pharmacy Refill Coordination Note    Specialty Medication(s) to be Shipped:   Infectious Disease: Mycophenolate    Other medication(s) to be shipped: No additional medications requested for fill at this time     Margaret Berger, DOB: [birth year]  Phone: [phone] (home)       All above HIPAA information was verified with patient.     Was a Nurse, learning disability used for this call? No    Completed refill call assessment today to schedule patient's medication shipment from the [facility] [phone]).  All relevant notes have been reviewed.     Specialty medication(s) and dose(s) confirmed: Regimen is correct and unchanged.   Changes to medications: Denina reports no changes at this time.  Changes to insurance: No  New side effects reported not previously addressed with a pharmacist or physician: None reported  Questions for the pharmacist: No    Confirmed patient received a Conservation officer, historic buildings and a Surveyor, mining with first shipment. The patient will receive a drug information handout for each medication shipped and additional FDA Medication Guides as required.       DISEASE/MEDICATION-SPECIFIC INFORMATION        N/A    SPECIALTY MEDICATION ADHERENCE     Medication Adherence    Patient reported X missed doses in the last month: 0  Specialty Medication: mycophenolate 500mg               Were doses missed due to medication being on hold? No     mycophenolate 500mg  :  10 days of medicine on hand       REFERRAL TO PHARMACIST     Referral to the pharmacist: Not needed      [facility]     Shipping address confirmed in Epic.     Delivery Scheduled: Yes, Expected medication delivery date: 12/20.     Medication will be delivered via Next Day Courier to the prescription address in Epic WAM.    [name]   [facility] Pharm[facility] Specialty Technician

## 2021-07-01 MED FILL — MYCOPHENOLATE MOFETIL 500 MG TABLET: ORAL | 30 days supply | Qty: 120 | Fill #9

## 2021-07-04 DIAGNOSIS — H5213 Myopia, bilateral: Secondary | ICD-10-CM | POA: Diagnosis not present

## 2021-07-26 MED FILL — MYCOPHENOLATE MOFETIL 500 MG TABLET: ORAL | 30 days supply | Qty: 120 | Fill #10

## 2021-07-26 NOTE — Unmapped (Signed)
[facility] Pharmacy Refill Coordination Note    Specialty Medication(s) to be Shipped:   General Specialty: mycophenolate 500mg     Other medication(s) to be shipped: No additional medications requested for fill at this time     [name], DOB: 11-09-1960  Phone: [phone] (home)       All above HIPAA information was verified with patient.     Was a Nurse, learning disability used for this call? No    Completed refill call assessment today to schedule patient's medication shipment from the [facility] [phone]).  All relevant notes have been reviewed.     Specialty medication(s) and dose(s) confirmed: Regimen is correct and unchanged.   Changes to medications: Melodye reports no changes at this time.  Changes to insurance: No  New side effects reported not previously addressed with a pharmacist or physician: None reported  Questions for the pharmacist: No    Confirmed patient received a Conservation officer, historic buildings and a Surveyor, mining with first shipment. The patient will receive a drug information handout for each medication shipped and additional FDA Medication Guides as required.       DISEASE/MEDICATION-SPECIFIC INFORMATION        N/A    SPECIALTY MEDICATION ADHERENCE     Medication Adherence    Patient reported X missed doses in the last month: 0  Specialty Medication: mycophenolate 500 mg  Patient is on additional specialty medications: No              Were doses missed due to medication being on hold? No    Mycophenolate 500 mg: 10 days of medicine on hand        REFERRAL TO PHARMACIST     Referral to the pharmacist: Not needed      [facility]     Shipping address confirmed in Epic.     Delivery Scheduled: Yes, Expected medication delivery date: 07/29/21.     Medication will be delivered via UPS to the temporary address in Epic WAM.    Unk Lightning   [name] Specialty Technician

## 2021-08-21 ENCOUNTER — Ambulatory Visit
Admit: 2021-08-21 | Discharge: 2021-08-21 | Payer: PRIVATE HEALTH INSURANCE | Attending: Gastroenterology | Primary: Gastroenterology

## 2021-08-21 ENCOUNTER — Ambulatory Visit: Admit: 2021-08-21 | Discharge: 2021-08-21 | Payer: PRIVATE HEALTH INSURANCE

## 2021-08-21 DIAGNOSIS — K7460 Unspecified cirrhosis of liver: Secondary | ICD-10-CM | POA: Diagnosis not present

## 2021-08-21 DIAGNOSIS — K754 Autoimmune hepatitis: Secondary | ICD-10-CM | POA: Diagnosis not present

## 2021-08-21 DIAGNOSIS — R945 Abnormal results of liver function studies: Secondary | ICD-10-CM | POA: Diagnosis not present

## 2021-08-21 DIAGNOSIS — Z6823 Body mass index (BMI) 23.0-23.9, adult: Secondary | ICD-10-CM | POA: Diagnosis not present

## 2021-08-21 DIAGNOSIS — B179 Acute viral hepatitis, unspecified: Secondary | ICD-10-CM | POA: Diagnosis not present

## 2021-08-21 DIAGNOSIS — K766 Portal hypertension: Secondary | ICD-10-CM | POA: Diagnosis not present

## 2021-08-21 LAB — CBC W/ AUTO DIFF
BASOPHILS ABSOLUTE COUNT: 0 10*9/L (ref 0.0–0.1)
BASOPHILS RELATIVE PERCENT: 0.5 %
EOSINOPHILS ABSOLUTE COUNT: 0.1 10*9/L (ref 0.0–0.5)
EOSINOPHILS RELATIVE PERCENT: 1.7 %
HEMATOCRIT: 40.5 % (ref 34.0–44.0)
HEMOGLOBIN: 13.5 g/dL (ref 11.3–14.9)
LYMPHOCYTES ABSOLUTE COUNT: 1.3 10*9/L (ref 1.1–3.6)
LYMPHOCYTES RELATIVE PERCENT: 29.4 %
MEAN CORPUSCULAR HEMOGLOBIN CONC: 33.3 g/dL (ref 32.0–36.0)
MEAN CORPUSCULAR HEMOGLOBIN: 31.7 pg (ref 25.9–32.4)
MEAN CORPUSCULAR VOLUME: 95.1 fL (ref 77.6–95.7)
MEAN PLATELET VOLUME: 8.1 fL (ref 6.8–10.7)
MONOCYTES ABSOLUTE COUNT: 0.3 10*9/L (ref 0.3–0.8)
MONOCYTES RELATIVE PERCENT: 7.3 %
NEUTROPHILS ABSOLUTE COUNT: 2.7 10*9/L (ref 1.8–7.8)
NEUTROPHILS RELATIVE PERCENT: 61.1 %
PLATELET COUNT: 178 10*9/L (ref 150–450)
RED BLOOD CELL COUNT: 4.26 10*12/L (ref 3.95–5.13)
RED CELL DISTRIBUTION WIDTH: 13.2 % (ref 12.2–15.2)
WBC ADJUSTED: 4.4 10*9/L (ref 3.6–11.2)

## 2021-08-21 LAB — COMPREHENSIVE METABOLIC PANEL
ALBUMIN: 4.4 g/dL (ref 3.4–5.0)
ALKALINE PHOSPHATASE: 50 U/L (ref 46–116)
ALT (SGPT): 15 U/L (ref 10–49)
ANION GAP: 7 mmol/L (ref 5–14)
AST (SGOT): 18 U/L (ref <=34)
BILIRUBIN TOTAL: 0.8 mg/dL (ref 0.3–1.2)
BLOOD UREA NITROGEN: 15 mg/dL (ref 9–23)
BUN / CREAT RATIO: 24
CALCIUM: 10.3 mg/dL (ref 8.7–10.4)
CHLORIDE: 105 mmol/L (ref 98–107)
CO2: 26.9 mmol/L (ref 20.0–31.0)
CREATININE: 0.63 mg/dL
EGFR CKD-EPI (2021) FEMALE: >90 mL/min/{1.73_m2} (ref >=60)
GLUCOSE RANDOM: 109 mg/dL — ABNORMAL HIGH (ref 70–99)
POTASSIUM: 4.9 mmol/L — ABNORMAL HIGH (ref 3.4–4.8)
PROTEIN TOTAL: 7.2 g/dL (ref 5.7–8.2)
SODIUM: 139 mmol/L (ref 135–145)

## 2021-08-21 LAB — AFP TUMOR MARKER: AFP-TUMOR MARKER: 2 ng/mL (ref <=8)

## 2021-08-21 LAB — PROTIME-INR
INR: 0.98
PROTIME: 11.1 s (ref 9.8–12.8)

## 2021-08-21 LAB — HEMOGLOBIN A1C
ESTIMATED AVERAGE GLUCOSE: 108 mg/dL
HEMOGLOBIN A1C: 5.4 % (ref 4.8–5.6)

## 2021-08-21 MED ORDER — MYCOPHENOLATE MOFETIL 500 MG TABLET
ORAL_TABLET | Freq: Two times a day (BID) | ORAL | 3 refills | 90 days | Status: CP
Start: 2021-08-21 — End: 2022-08-21
  Filled 2021-08-26: qty 120, 30d supply, fill #0

## 2021-08-21 MED ADMIN — gadobenate dimeglumine (MULTIHANCE) 529 mg/mL (0.1mmol/0.2mL) solution 8 mL: 8 mL | INTRAVENOUS | @ 15:00:00 | Stop: 2021-08-21

## 2021-08-21 NOTE — Unmapped (Signed)
Autoimmune hepatitis, In remission on Cellcept, continue this medication indefinitely. Will check results of MRI from today. Labs today. Return in 6 months with MRI same day.  Please call radiology to schedule the imaging of your liver [phone].

## 2021-08-21 NOTE — Unmapped (Signed)
[facility]    [name], M.D.  Professor of Medicine  Director, [facility]  [place] [facility] [place] [facility] [place]    [phone]    [name]    Referring MD: Midge Minium, MD      Chief complaint: Office follow-up for elevation of liver enzymes. Hospitalization (08/2016) for acute hepatitis of uncertain etiology, presumed autoimmune hepatitis treated with prednisone and mycophenylate. DRUG FEVER ASSOCIATED WITH AZATHIOPRINE. STEROIDS STOPPED 07/2017. CONTINUES ON CELLCEPT 1 GRAM BID FOR IMMUNOSUPPRESSION    Interval history: Patient continues to feel great. Riding horses 2-3 x per week. She denies fever, cough, sob, N/V, abdominal pain, chest pain, or skin rash.  She has been maintained on mycophenylate 1gram bid. No new health related concerns. She has received COVID vaccine in [place] x 2 and also the booster dose x 3.      Present illness at presentation:  Patient is a 61 y.o. Caucasian female who was hospitalized at [facility] between approximately February 12 -  August 29, 2016 with acute hepatitis. The patient had about 10 weeks of abnormal liver tests documented by her primary physician and her primary gastroenterologist. This was preceded by a short flu-like illness characterized by fatigue, muscle aches, and low-grade fever. All of the symptoms have completely remained resolved since her  Visit 09/10/2016. However she was left with elevated liver tests and had an extensive workup as an outpatient and subsequently as an inpatient at [facility] to further define the etiology of her liver disease.  Additional details of her history and prior outpatient evaluation as well as her inpatient evaluation are available from our initial consultation note and follow-up notes while she was hospitalized.       After review of the liver biopsy and all serologic data thus far which has been unrevealing, it was felt that a therapeutic trial of prednisone for presumed seronegative autoimmune hepatitis was warranted. Prednisone  40 mg of daily started on 08/29/16. She had a good biochemical response with marked improvement in ALT, INR, including a slow but steady decrease in Total/direct bilirubin.     Patient had an extensive serological evaluation which was not revealing:  1. Antinuclear antibody and anti-smooth muscle antibody were negative. Immunoglobulin G was not elevated (1061)  2. Viral serologies including acute hepatitis A, hepatitis B, hepatitis C, HCV RNA, EBV PCR, CMV PCR, HHV-6 were all negative.  3. Liver ultrasound demonstrated patent vasculature with a nodular-appearing heterogeneous liver.  4. Percutaneous liver biopsy 08/2016 at [facility]:    A: Liver, core biopsy   - Severe active hepatitis with confluent areas of periportal hepatocellular necrosis (approximately 10-20% of sampled parenchyma) (see comment)   - Trichrome stain demonstrates at least periportal fibrosis and is suspicious for bridging fibrosis     Potential etiologies to consider include, but are not limited to, infection, drug/toxin-induced liver injury, seronegative autoimmune hepatitis, and metabolic disorders. The trichrome stain findings suggest that this process is at least subacute in duration.      Liver biopsy slides were personally reviewed by Dr. Sharon Mt  with the pathologist.  No viral inclusions were noted and only few plasma cells were visualized in the liver biopsy which was an adequate specimen. No cytopathic effect. EBV immunostains were negative. Fe in hepatocytes only grade 1/4    5.  Evaluation for Zoster: Dr. Foy Guadalajara received phone call from patient's primary GI MD that patient's Zoster IgM from 09/05/2016 was positive with titer 1.17 approximately one  week after starting prednisone. Review of her record though revealed this test to have been negative on 08/29/2016.  Dr. Sharon Mt discussed these findings with Dr. Lytle Butte and the decision was made to proceed with patient starting Valtrex 500 mg bid.  She was seen by Dr. 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   Recommendations from ID: Continue valacyclovir 500 mg bid.  Advised readdressing stopping Valtrex if her repeat IgM is negative or once she is on <15 mg prednisone daily.  Valtrex was discontinued last week.  Bactrim prophylaxis while prednisone >15 mg q 24. 1 DS tablet M/W/F. The following vaccines were recommended for her to obtain under the care of her PCP: Twinrix, PCV13, and PPSV23. She should have these performed by her PCP when prednisone dose is  <20 mg daily.     Immunosuppressive history:  Patient was started on azathioprine for steroid sparing benefits in early April.  After 2 weeks patient developed fevers. ID workup negative during hospitalization.  Azathioprine was held during that hospitalization and fevers resolved. When she restarted azathioprine as outpatient, immediately had high fevers and shaking chills.  This rechallenge was highly suggestive of drug fever from azathioprine.    On 5/12 patient was started on CellCept 500mg  bid and continued on prednisone 15 mg daily.  On 12/03/16, liver enzymes remained elevated. Cellcept was increased to 1000mg  BID on 12/04/16. Prednisone tapered to off as of 07/2017.     10 sys ROS otherwise negative.     Past medical history:  Patient has generally been healthy until the current hepatitis began in December 2017.  1. No history of diabetes, coronary artery disease, hypertension, or lung disease.  2. No history of other autoimmune systemic diseases  3. Excision of incidental benign adnexal cyst  3. FIBROSCAN 11/05/16: 17.7 kps c/w stage F4 (Patient ate breakfast 3 hours prior).  4. MRI 10/2017: HEPATOBILIARY: Nodular liver contour compatible with history of cirrhosis and similar to prior. Again seen is a geographic region of T1 hypointensity and corresponding slight T2 hyperintensity in the posterior right hepatic lobe which is overall similar in extent when compared to prior imaging. This region demonstrates progressive postcontrast enhancement as before. Overall similar degree of capsular retraction most pronounced along the right anterior margin of the liver (13:29). No discrete early arterially enhancing or washout liver lesion identified. No biliary ductal dilatation. Similar appearance and position of the borderline hydropic gallbladder.  PANCREAS: Pancreas divisum. No ductal dilatation.  5. MRI 06/2020 and 12/2020: Morphologic features consistent with history of cirrhosis. No sequela of portal hypertension.No MR evidence of HCC.  Similar appearing probable confluent fibrosis in the right hepatic lobe.   6. MRI 08/2021: PENDING    Allergies   Allergen Reactions   ??? Azathioprine Other (See Comments)     Fever  Other reaction(s): Other (See Comments)  Fever       Current Outpatient Medications   Medication Sig Dispense Refill   ??? mycophenolate (CELLCEPT) 500 mg tablet Take 2 tablets (1,000 mg total) by mouth 2 (two) times a day with meals. 360 tablet 3     No current facility-administered medications for this visit.     Social history: The patient is married. She works as a Materials engineer. They have traveled extensively.   She does not drink any alcohol. She does not smoke cigarettes.    Family history: Negative for liver disease or liver cancer. Negative  for autoimmune systemic diseases.    BP 140/77 (BP Site: R Arm, BP Position: Sitting, BP Cuff Size: Medium)  - Pulse 64  - Temp 36.3 ??C (97.4 ??F) (Temporal)  - Wt 66.7 kg (147 lb)  - SpO2 100%  - BMI 23.73 kg/m??     Pleasant individual in NAD    HEENT: Sclera are anicteric, no temporal muscle loss  NECK: No thyromegaly or lymphadenopathy, No carotid bruits  Chest: Clear to auscultation and percussion  Heart: S1, S2, RR, No murmurs  Abdomen: Soft, non-tender, non-distended, no hepatosplenomegaly, no masses appreciated, no ascites  Skin: No spider angiomata, No rashes  Extremities: Without pedal edema, no palmar erythema  Neuro: Grossly intact, No focal deficits      Results for orders placed or performed in visit on 08/21/21   AFP tumor marker   Result Value Ref Range    AFP-Tumor Marker 2 <=8 ng/mL   PT-INR   Result Value Ref Range    PT 11.1 9.8 - 12.8 sec    INR 0.98    Comprehensive Metabolic Panel   Result Value Ref Range    Sodium 139 135 - 145 mmol/L    Potassium 4.9 (H) 3.4 - 4.8 mmol/L    Chloride 105 98 - 107 mmol/L    CO2 26.9 20.0 - 31.0 mmol/L    Anion Gap 7 5 - 14 mmol/L    BUN 15 9 - 23 mg/dL    Creatinine 6.57 8.46 - 0.80 mg/dL    BUN/Creatinine Ratio 24     eGFR CKD-EPI (2021) Female >90 >=60 mL/min/1.5m2    Glucose 109 (H) 70 - 99 mg/dL    Calcium 96.2 8.7 - 95.2 mg/dL    Albumin 4.4 3.4 - 5.0 g/dL    Total Protein 7.2 5.7 - 8.2 g/dL    Total Bilirubin 0.8 0.3 - 1.2 mg/dL    AST 18 <=84 U/L    ALT 15 10 - 49 U/L    Alkaline Phosphatase 50 46 - 116 U/L   Hemoglobin A1c   Result Value Ref Range    Hemoglobin A1C 5.4 4.8 - 5.6 %    Estimated Average Glucose 108 mg/dL   CBC w/ Differential   Result Value Ref Range    WBC 4.4 3.6 - 11.2 10*9/L    RBC 4.26 3.95 - 5.13 10*12/L    HGB 13.5 11.3 - 14.9 g/dL    HCT 13.2 44.0 - 10.2 %    MCV 95.1 77.6 - 95.7 fL    MCH 31.7 25.9 - 32.4 pg    MCHC 33.3 32.0 - 36.0 g/dL    RDW 72.5 36.6 - 44.0 %    MPV 8.1 6.8 - 10.7 fL    Platelet 178 150 - 450 10*9/L    Neutrophils % 61.1 %    Lymphocytes % 29.4 %    Monocytes % 7.3 %    Eosinophils % 1.7 %    Basophils % 0.5 %    Absolute Neutrophils 2.7 1.8 - 7.8 10*9/L    Absolute Lymphocytes 1.3 1.1 - 3.6 10*9/L    Absolute Monocytes 0.3 0.3 - 0.8 10*9/L    Absolute Eosinophils 0.1 0.0 - 0.5 10*9/L    Absolute Basophils 0.0 0.0 - 0.1 10*9/L     MELD-Na score: 6 at 04/02/2021  1:46 PM  MELD score: 6 at 04/02/2021  1:46 PM  Calculated from:  Serum Creatinine: 0.79 mg/dL (Using min of 1 mg/dL) at 3/47/4259  1:46 PM  Serum Sodium: 143 mmol/L (Using max of 137 mmol/L) at 04/02/2021  1:46 PM  Total Bilirubin: 0.3 mg/dL (Using min of 1 mg/dL) at 1/61/0960  4:54 PM  INR(ratio): 1.0 at 04/02/2021  1:46 PM  Age: 61 years      Impression:  1. Seronegative autoimmune hepatitis based on response to immunosuppressive medication. The patient presented with severe hepatitis as evidenced by hyperbilirubinemia, elevation of her INR, decreased albumin, and results from her liver biopsy. Patient was started on a therapeutic trial of prednisone for the reasons discussed above. After only 4 days of immunosuppression, her liver enzymes (ALT)  that had been regularly elevated above 1000 decreased substantially.  This was highly suggestive of a positive effect of the prednisone and was c/w  presumed ANA-negative/SMA-negative autoimmune hepatitis.Labs showed continued normalization of bilirubin and INR, although she remained with mildly elevated ALT in the 40-45 range. CellCept was added on 5/12 and increased to 1000mg  BID on 12/04/16.Steroids tapered to off as of 07/2017.    We will maintain current level of CellCept.  ALT normal.  Patient feels completely well and labs stable. In remission on CellCept.     2. Cirrhosis, well compensated:  FIBROSCAN suggestive of cirrhosis and MRI suggestive of nodular contour with focal fibrosis.  Liver biopsy during acute episode did demonstrate possible bridging fibrosis. Inflammation now resolved.    3. HCC surveillance: Findings of cirrhosis as described above. 08/2021- Changes of cirrhosis. No liver masses.   Repeat in 6 months at next visit.      4. Evaluate portal HTN: MRI from 6/22 showed some changes c/w increasing portal HTN despite remission of autoimmune hepatitis. Cirrhotic stigmata with evidence of increased portal pressures including recannulized umbilical vein and small caliber upper abdominal varices. EGD 06/2021: No varices. Mild esophagitis.      5. Elevated non-fasting glucose- check HgbA1C pending.    RTC in 6 months with MRI same day. Labs at 3 months interval while on immunosuppression.      [name], M.D.  Professor of Medicine  Director, [facility]  [place] [facility] [place] [facility] [place]    [phone]

## 2021-08-22 NOTE — Unmapped (Signed)
[facility] Refill Coordination Note    Specialty Medication(s) to be Shipped:   Transplant: mycophenolate mofetil 500mg     Other medication(s) to be shipped: No additional medications requested for fill at this time     Margaret Berger, DOB: 1960-11-28  Phone: [phone] (home)       All above HIPAA information was verified with patient.     Was a Nurse, learning disability used for this call? No    Completed refill call assessment today to schedule patient's medication shipment from the [facility] [phone]).  All relevant notes have been reviewed.     Specialty medication(s) and dose(s) confirmed: Regimen is correct and unchanged.   Changes to medications: Margaret Berger reports no changes at this time.  Changes to insurance: No  New side effects reported not previously addressed with a pharmacist or physician: None reported  Questions for the pharmacist: No    Confirmed patient received a Conservation officer, historic buildings and a Surveyor, mining with first shipment. The patient will receive a drug information handout for each medication shipped and additional FDA Medication Guides as required.       DISEASE/MEDICATION-SPECIFIC INFORMATION        N/A    SPECIALTY MEDICATION ADHERENCE     Medication Adherence    Patient reported X missed doses in the last month: 0  Specialty Medication: mycophenolate 500 mg  Patient is on additional specialty medications: No              Were doses missed due to medication being on hold? No     mycophenolate 500 mg  8 days worth of medication on hand.      REFERRAL TO PHARMACIST     Referral to the pharmacist: Not needed      [facility]     Shipping address confirmed in Epic.     Delivery Scheduled: Yes, Expected medication delivery date: 08/26/21.     Medication will be delivered via UPS to the temporary address in Epic WAM.    Margaret Berger   [facility] Specialty Technician

## 2021-08-23 NOTE — Unmapped (Signed)
Margaret Berger called back about the delivery for Mycophenolate and would like the delivery to ship out 08/26/21 via  [facility] to be delivered 08/27/21 . We have confirmed the delivery via [facility] sed .

## 2021-08-23 NOTE — Unmapped (Signed)
Margaret Berger 's Mycophenolate shipment will be delayed as a result of the medication is too soon to refill until 08/25/2021.     I have reached out to the patient  at [phone] and left a voicemail message.  We will wait for a call back from the patient to reschedule the delivery.  We have not confirmed the new delivery date.

## 2021-09-19 NOTE — Unmapped (Signed)
[facility] Refill Coordination Note    Specialty Medication(s) to be Shipped:   Infectious Disease: Mycophenolate    Other medication(s) to be shipped: No additional medications requested for fill at this time     Margaret Berger, DOB: 1961/05/13  Phone: [phone] (home)       All above HIPAA information was verified with patient.     Was a Nurse, learning disability used for this call? No    Completed refill call assessment today to schedule patient's medication shipment from the [facility] [phone]).  All relevant notes have been reviewed.     Specialty medication(s) and dose(s) confirmed: Regimen is correct and unchanged.   Changes to medications: Margaret Berger reports no changes at this time.  Changes to insurance: No  New side effects reported not previously addressed with a pharmacist or physician: None reported  Questions for the pharmacist: No    Confirmed patient received a Conservation officer, historic buildings and a Surveyor, mining with first shipment. The patient will receive a drug information handout for each medication shipped and additional FDA Medication Guides as required.       DISEASE/MEDICATION-SPECIFIC INFORMATION        N/A    SPECIALTY MEDICATION ADHERENCE     Medication Adherence    Patient reported X missed doses in the last month: 0  Specialty Medication: mycophenolate 500mg               Were doses missed due to medication being on hold? No    Mycophenolate 500mg   : 10 days of medicine on hand       REFERRAL TO PHARMACIST     Referral to the pharmacist: Not needed      [facility]     Shipping address confirmed in Epic.     Delivery Scheduled: Yes, Expected medication delivery date: 3/16.     Medication will be delivered via UPS to the temporary address in Epic WAM.    Margaret Berger   [facility] Specialty Technician

## 2021-09-25 MED FILL — MYCOPHENOLATE MOFETIL 500 MG TABLET: ORAL | 30 days supply | Qty: 120 | Fill #1

## 2021-10-22 NOTE — Unmapped (Signed)
Margaret Berger 's Mycophenolate 500mg  tablet shipment will be delayed as a result of the medication is too soon to refill until 10/24/2021.     I have reached out to the patient  at [phone] and communicated the delivery change. We will reschedule the medication for the delivery date that the patient agreed upon.  We have confirmed the delivery date as 10/25/2021, via ups.

## 2021-10-22 NOTE — Unmapped (Signed)
[facility] Clinical Assessment & Refill Coordination Note    Margaret Berger, DOB: 11/26/1960  Phone: [phone] (home)     All above HIPAA information was verified with patient.     Was a Nurse, learning disability used for this call? No    Specialty Medication(s):   Inflammatory Disorders: mycophenolate     Current Outpatient Medications   Medication Sig Dispense Refill    mycophenolate (CELLCEPT) 500 mg tablet Take 2 tablets (1,000 mg total) by mouth 2 (two) times a day with meals. 360 tablet 3     No current facility-administered medications for this visit.        Changes to medications: Mahima reports no changes at this time.    Allergies   Allergen Reactions    Azathioprine Other (See Comments)     Fever  Other reaction(s): Other (See Comments)  Fever       Changes to allergies: No    SPECIALTY MEDICATION ADHERENCE     Mycophenolate mofetil 500 mg: 7 to 10 days of medicine on hand       Medication Adherence    Patient reported X missed doses in the last month: 0  Specialty Medication: mycophenolate mofetil 500mg   Patient is on additional specialty medications: No  Any gaps in refill history greater than 2 weeks in the last 3 months: no  Demonstrates understanding of importance of adherence: yes  Informant: patient  Provider-estimated medication adherence level: good  Patient is at risk for Non-Adherence: No          Specialty medication(s) dose(s) confirmed: Regimen is correct and unchanged.     Are there any concerns with adherence? No    Adherence counseling provided? Not needed    CLINICAL MANAGEMENT AND INTERVENTION      Clinical Benefit Assessment:    Do you feel the medicine is effective or helping your condition? Yes    Clinical Benefit counseling provided? Not needed    Adverse Effects Assessment:    Are you experiencing any side effects? No    Are you experiencing difficulty administering your medicine? No    Quality of Life Assessment:    How many days over the past month did your Autoimmune Hepatitis  keep you from your normal activities? For example, brushing your teeth or getting up in the morning. 0    Have you discussed this with your provider? Not needed    Acute Infection Status:    Acute infections noted within Epic:  No active infections  Patient reported infection: None    Therapy Appropriateness:    Is therapy appropriate and patient progressing towards therapeutic goals? Yes, therapy is appropriate and should be continued    DISEASE/MEDICATION-SPECIFIC INFORMATION      N/A    PATIENT SPECIFIC NEEDS     Does the patient have any physical, cognitive, or cultural barriers? No    Is the patient high risk? Yes, patient is taking a REMS drug. Medication is dispensed in compliance with REMS program    Does the patient require a Care Management Plan? No     SHIPPING     Specialty Medication(s) to be Shipped:   Inflammatory Disorders: mycophenolate    Other medication(s) to be shipped: No additional medications requested for fill at this time     Changes to insurance: No    Delivery Scheduled: Yes, Expected medication delivery date: 10/23/21.     Medication will be delivered via UPS to the confirmed temporary  address in [facility].    The patient will receive a drug information handout for each medication shipped and additional FDA Medication Guides as required.  Verified that patient has previously received a Conservation officer, historic buildings and a Surveyor, mining.    The patient or caregiver noted above participated in the development of this care plan and knows that they can request review of or adjustments to the care plan at any time.      All of the patient's questions and concerns have been addressed.    Roderic Palau   [facility] Specialty Pharmacist

## 2021-10-24 MED FILL — MYCOPHENOLATE MOFETIL 500 MG TABLET: ORAL | 30 days supply | Qty: 120 | Fill #2

## 2021-11-21 NOTE — Unmapped (Signed)
[facility] Refill Coordination Note    Specialty Medication(s) to be Shipped:   Infectious Disease: Mycophenolate    Other medication(s) to be shipped: No additional medications requested for fill at this time     Margaret Berger, DOB: 05-Sep-1960  Phone: [phone] (home)       All above HIPAA information was verified with patient.     Was a Nurse, learning disability used for this call? No    Completed refill call assessment today to schedule patient's medication shipment from the [facility] [phone]).  All relevant notes have been reviewed.     Specialty medication(s) and dose(s) confirmed: Regimen is correct and unchanged.   Changes to medications: Solange reports no changes at this time.  Changes to insurance: No  New side effects reported not previously addressed with a pharmacist or physician: None reported  Questions for the pharmacist: No    Confirmed patient received a Conservation officer, historic buildings and a Surveyor, mining with first shipment. The patient will receive a drug information handout for each medication shipped and additional FDA Medication Guides as required.       DISEASE/MEDICATION-SPECIFIC INFORMATION        N/A    SPECIALTY MEDICATION ADHERENCE     Medication Adherence    Patient reported X missed doses in the last month: 0  Specialty Medication: mycophenolate 500 mg              Were doses missed due to medication being on hold? No    Mycophenolate 500mg   : 10 days of medicine on hand       REFERRAL TO PHARMACIST     Referral to the pharmacist: Not needed      [facility]     Shipping address confirmed in Epic.     Delivery Scheduled: Yes, Expected medication delivery date: 5/16.     Medication will be delivered via UPS to the temporary address in Epic WAM.    [name]   [facility] Specialty Technician

## 2021-11-25 ENCOUNTER — Ambulatory Visit: Payer: BC Managed Care – PPO | Admitting: Dermatology

## 2021-11-25 DIAGNOSIS — L570 Actinic keratosis: Secondary | ICD-10-CM | POA: Diagnosis not present

## 2021-11-25 DIAGNOSIS — K754 Autoimmune hepatitis: Secondary | ICD-10-CM | POA: Diagnosis not present

## 2021-11-25 DIAGNOSIS — L578 Other skin changes due to chronic exposure to nonionizing radiation: Secondary | ICD-10-CM

## 2021-11-25 DIAGNOSIS — L814 Other melanin hyperpigmentation: Secondary | ICD-10-CM

## 2021-11-25 MED FILL — MYCOPHENOLATE MOFETIL 500 MG TABLET: ORAL | 30 days supply | Qty: 120 | Fill #3

## 2021-11-25 NOTE — Progress Notes (Signed)
Follow-Up Visit   Subjective  Jessica Zuniga is a 61 y.o. female who presents for the following: Actinic Keratosis (Here for treatment of possible precancer spot at left lower lip. ).   The patient is on immunosuppressive treatment due to idiopathic hepatitis. The patient has spots, moles and lesions to be evaluated, some may be new or changing and the patient has concerns that these could be cancer.  The following portions of the chart were reviewed this encounter and updated as appropriate:  Tobacco  Allergies  Meds  Problems  Med Hx  Surg Hx  Fam Hx     Review of Systems: No other skin or systemic complaints except as noted in HPI or Assessment and Plan.  Objective  Well appearing patient in no apparent distress; mood and affect are within normal limits.  A focused examination was performed including left lower lateral lip. Relevant physical exam findings are noted in the Assessment and Plan.  left lower lateral lip x 1 Hyperkeratotic Erythematous thin papules/macules with gritty scale.    Assessment & Plan  Actinic keratosis left lower lateral lip x 1  Actinic keratoses are precancerous spots that appear secondary to cumulative UV radiation exposure/sun exposure over time. They are chronic with expected duration over 1 year. A portion of actinic keratoses will progress to squamous cell carcinoma of the skin. It is not possible to reliably predict which spots will progress to skin cancer and so treatment is recommended to prevent development of skin cancer.  Recommend daily broad spectrum sunscreen SPF 30+ to sun-exposed areas, reapply every 2 hours as needed.  Recommend staying in the shade or wearing long sleeves, sun glasses (UVA+UVB protection) and wide brim hats (4-inch brim around the entire circumference of the hat). Call for new or changing lesions.  Destruction of lesion - left lower lateral lip x 1 Complexity: simple   Destruction method: cryotherapy   Informed  consent: discussed and consent obtained   Timeout:  patient name, date of birth, surgical site, and procedure verified Lesion destroyed using liquid nitrogen: Yes   Region frozen until ice ball extended beyond lesion: Yes   Outcome: patient tolerated procedure well with no complications   Post-procedure details: wound care instructions given   Additional details:  Prior to procedure, discussed risks of blister formation, small wound, skin dyspigmentation, or rare scar following cryotherapy. Recommend Vaseline ointment to treated areas while healing.  If area grows or persists or thickens and does not resolve, should be reevaluated no later than 5 weeks from now due to patient being on immunosuppressive treatment for idiopathic hepatitis which may make her at higher risk for actinic keratoses to transform to squamous cell carcinoma. If this lesion does persist, I would recommend biopsy.  Actinic Damage - Severe, confluent actinic changes with pre-cancerous actinic keratoses  - Severe, chronic, not at goal, secondary to cumulative UV radiation exposure over time - diffuse scaly erythematous macules and papules with underlying dyspigmentation - Discussed Prescription "Field Treatment" for Severe, Chronic Confluent Actinic Changes with Pre-Cancerous Actinic Keratoses Field treatment involves treatment of an entire area of skin that has confluent Actinic Changes (Sun/ Ultraviolet light damage) and PreCancerous Actinic Keratoses by method of PhotoDynamic Therapy (PDT) and/or prescription Topical Chemotherapy agents such as 5-fluorouracil, 5-fluorouracil/calcipotriene, and/or imiquimod.  The purpose is to decrease the number of clinically evident and subclinical PreCancerous lesions to prevent progression to development of skin cancer by chemically destroying early precancer changes that may or may not be visible.  It has been shown to reduce the risk of developing skin cancer in the treated area. As a  result of treatment, redness, scaling, crusting, and open sores may occur during treatment course. One or more than one of these methods may be used and may have to be used several times to control, suppress and eliminate the PreCancerous changes. Discussed treatment course, expected reaction, and possible side effects. - Recommend daily broad spectrum sunscreen SPF 30+ to sun-exposed areas, reapply every 2 hours as needed.  - Staying in the shade or wearing long sleeves, sun glasses (UVA+UVB protection) and wide brim hats (4-inch brim around the entire circumference of the hat) are also recommended. - Call for new or changing lesions. Start cream in 1 month  After 1 month from today, start 5-fluorouracil/calcipotriene cream twice a day for 7 days to affected area of left lower lateral lip . Prescription sent to [facility]. Patient provided with contact information for pharmacy and advised the pharmacy will mail the prescription to their home. Patient provided with handout reviewing treatment course and side effects and advised to call or message [place] on MyChart with any concerns.  Lentigines - Scattered tan macules - Due to sun exposure - Benign-appering, observe - Recommend daily broad spectrum sunscreen SPF 30+ to sun-exposed areas, reapply every 2 hours as needed. - Call for any changes  Return if symptoms worsen or fail to improve. IRuthell Rummage, CMA, am acting as scribe for Sarina Ser, MD. Documentation: I have reviewed the above documentation for accuracy and completeness, and I agree with the above.  Sarina Ser, MD

## 2021-11-25 NOTE — Patient Instructions (Addendum)
Start Cream in 1 month  ? ? ?Start Start 5-fluorouracil/calcipotriene cream twice a day for 7 days to affected areas including left lower lip. Prescription sent to [facility]. Patient provided with contact information for pharmacy and advised the pharmacy will mail the prescription to their home. Patient provided with handout reviewing treatment course and side effects and advised to call or message [place] on MyChart with any concerns. ? ? ? ? ? ?5-Fluorouracil/Calcipotriene Patient Education  ? ?Actinic keratoses are the dry, red scaly spots on the skin caused by sun damage. A portion of these spots can turn into skin cancer with time, and treating them can help prevent development of skin cancer.  ? ?Treatment of these spots requires removal of the defective skin cells. There are various ways to remove actinic keratoses, including freezing with liquid nitrogen, treatment with creams, or treatment with a blue light procedure in the office.  ? ?5-fluorouracil cream is a topical cream used to treat actinic keratoses. It works by interfering with the growth of abnormal fast-growing skin cells, such as actinic keratoses. These cells peel off and are replaced by healthy ones.  ? ?5-fluorouracil/calcipotriene is a combination of the 5-fluorouracil cream with a vitamin D analog cream called calcipotriene. The calcipotriene alone does not treat actinic keratoses. However, when it is combined with 5-fluorouracil, it helps the 5-fluorouracil treat the actinic keratoses much faster so that the same results can be achieved with a much shorter treatment time. ? ?INSTRUCTIONS FOR 5-FLUOROURACIL/CALCIPOTRIENE CREAM:  ? ?5-fluorouracil/calcipotriene cream typically only needs to be used for 4-7 days. A thin layer should be applied twice a day to the treatment areas recommended by your physician.  ? ?If your physician prescribed you separate tubes of 5-fluourouracil and calcipotriene, apply a thin layer of 5-fluorouracil  followed by a thin layer of calcipotriene.  ? ?Avoid contact with your eyes, nostrils, and mouth. Do not use 5-fluorouracil/calcipotriene cream on infected or open wounds.  ? ?You will develop redness, irritation and some crusting at areas where you have pre-cancer damage/actinic keratoses. IF YOU DEVELOP PAIN, BLEEDING, OR SIGNIFICANT CRUSTING, STOP THE TREATMENT EARLY - you have already gotten a good response and the actinic keratoses should clear up well. ? ?Wash your hands after applying 5-fluorouracil 5% cream on your skin.  ? ?A moisturizer or sunscreen with a minimum SPF 30 should be applied each morning.  ? ?Once you have finished the treatment, you can apply a thin layer of Vaseline twice a day to irritated areas to soothe and calm the areas more quickly. If you experience significant discomfort, contact your physician. ? ?For some patients it is necessary to repeat the treatment for best results. ? ?SIDE EFFECTS: When using 5-fluorouracil/calcipotriene cream, you may have mild irritation, such as redness, dryness, swelling, or a mild burning sensation. This usually resolves within 2 weeks. The more actinic keratoses you have, the more redness and inflammation you can expect during treatment. Eye irritation has been reported rarely. If this occurs, please let us know.  ?If you have any trouble using this cream, please call the office. If you have any other questions about this information, please do not hesitate to ask me before you leave the office. ? ? ? ? ? ?If You Need Anything After Your Visit ? ?If you have any questions or concerns for your doctor, please call our main line at [phone] and press option 4 to reach your doctor's medical assistant. If no one answers, please leave a voicemail as  directed and we will return your call as soon as possible. Messages left after 4 pm will be answered the following business day.  ? ?You may also send [place] a message via MyChart. We typically respond to MyChart  messages within 1-2 business days. ? ?For prescription refills, please ask your pharmacy to contact our office. Our fax number is [phone]. ? ?If you have an urgent issue when the clinic is closed that cannot wait until the next business day, you can page your doctor at the number below.   ? ?Please note that while we do our best to be available for urgent issues outside of office hours, we are not available 24/7.  ? ?If you have an urgent issue and are unable to reach [place], you may choose to seek medical care at your doctor's office, retail clinic, urgent care center, or emergency room. ? ?If you have a medical emergency, please immediately call 911 or go to the emergency department. ? ?Pager Numbers ? ?- Dr. Nehemiah Massed: [phone] ? ?- Dr. Laurence Ferrari: [phone] ? ?- Dr. Nicole Kindred: [phone] ? ?In the event of inclement weather, please call our main line at [phone] for an update on the status of any delays or closures. ? ?Dermatology Medication Tips: ?Please keep the boxes that topical medications come in in order to help keep track of the instructions about where and how to use these. Pharmacies typically print the medication instructions only on the boxes and not directly on the medication tubes.  ? ?If your medication is too expensive, please contact our office at [phone] option 4 or send [place] a message through [place].  ? ?We are unable to tell what your co-pay for medications will be in advance as this is different depending on your insurance coverage. However, we may be able to find a substitute medication at lower cost or fill out paperwork to get insurance to cover a needed medication.  ? ?If a prior authorization is required to get your medication covered by your insurance company, please allow [place] 1-2 business days to complete this process. ? ?Drug prices often vary depending on where the prescription is filled and some pharmacies may offer cheaper prices. ? ?The website www.goodrx.com contains  coupons for medications through different pharmacies. The prices here do not account for what the cost may be with help from insurance (it may be cheaper with your insurance), but the website can give you the price if you did not use any insurance.  ?- You can print the associated coupon and take it with your prescription to the pharmacy.  ?- You may also stop by our office during regular business hours and pick up a GoodRx coupon card.  ?- If you need your prescription sent electronically to a different pharmacy, notify our office through [facility] or by phone at [phone] option 4. ? ? ? ? ?Si Usted Necesita Algo Despu?s de Su Visita ? ?Tambi?n puede enviarnos un mensaje a trav?s de MyChart. Por lo general respondemos a los mensajes de MyChart en el transcurso de 1 a 2 d?as h?biles. ? ?Para renovar recetas, por favor pida a su farmacia que se ponga en contacto con nuestra oficina. Nuestro n?mero de fax es el [phone]. ? ?Si tiene un asunto urgente cuando la cl?nica est? cerrada y que no puede esperar hasta el siguiente d?a h?bil, puede llamar/localizar a su doctor(a) al n?mero que aparece a continuaci?n.  ? ?Por favor, tenga en cuenta que aunque hacemos todo lo posible para  estar disponibles para asuntos urgentes fuera del horario de oficina, no estamos disponibles las 24 horas del d?a, los 7 d?as de la semana.  ? ?Si tiene un problema urgente y no puede comunicarse con nosotros, puede optar por buscar atenci?n m?dica  en el consultorio de su doctor(a), en una cl?nica privada, en un centro de atenci?n urgente o en una sala de emergencias. ? ?Si tiene Engineer, maintenance (IT) m?dica, por favor llame inmediatamente al 911 o vaya a la sala de emergencias. ? ?N?meros de b?per ? ?- Dr. Nehemiah Massed: [phone] ? ?- Dra. Moye: [phone] ? ?- Dra. Nicole Kindred: [phone] ? ?En caso de inclemencias del tiempo, por favor llame a nuestra l?nea principal al [phone] para una actualizaci?n sobre el estado de  cualquier retraso o cierre. ? ?Consejos para la medicaci?n en dermatolog?a: ?Por favor, guarde las cajas en las que vienen los medicamentos de uso t?pico para ayudarle a seguir las instrucciones sobre d?nde y c?mo [place]

## 2021-11-25 NOTE — Unmapped (Signed)
Received message from patient regarding need for updated lab work.

## 2021-11-26 LAB — COMPREHENSIVE METABOLIC PANEL
A/G RATIO: 2.1 (ref 1.2–2.2)
ALBUMIN: 5 g/dL — ABNORMAL HIGH (ref 3.8–4.8)
ALKALINE PHOSPHATASE: 61 IU/L (ref 44–121)
ALT (SGPT): 19 IU/L (ref 0–32)
AST (SGOT): 19 IU/L (ref 0–40)
BILIRUBIN TOTAL (MG/DL) IN SER/PLAS: 0.5 mg/dL (ref 0.0–1.2)
BLOOD UREA NITROGEN: 14 mg/dL (ref 8–27)
BUN / CREAT RATIO: 20 (ref 12–28)
CALCIUM: 10.1 mg/dL (ref 8.7–10.3)
CHLORIDE: 103 mmol/L (ref 96–106)
CO2: 25 mmol/L (ref 20–29)
CREATININE: 0.7 mg/dL (ref 0.57–1.00)
EGFR: 98 mL/min/{1.73_m2}
GLOBULIN, TOTAL: 2.4 g/dL (ref 1.5–4.5)
GLUCOSE: 103 mg/dL — ABNORMAL HIGH (ref 70–99)
POTASSIUM: 4.2 mmol/L (ref 3.5–5.2)
SODIUM: 140 mmol/L (ref 134–144)
TOTAL PROTEIN: 7.4 g/dL (ref 6.0–8.5)

## 2021-11-26 LAB — CBC W/ DIFFERENTIAL
BANDED NEUTROPHILS ABSOLUTE COUNT: 0 10*3/uL (ref 0.0–0.1)
BASOPHILS ABSOLUTE COUNT: 0 10*3/uL (ref 0.0–0.2)
BASOPHILS RELATIVE PERCENT: 0 %
EOSINOPHILS ABSOLUTE COUNT: 0.1 10*3/uL (ref 0.0–0.4)
EOSINOPHILS RELATIVE PERCENT: 3 %
HEMATOCRIT: 40.7 % (ref 34.0–46.6)
HEMOGLOBIN: 13.6 g/dL (ref 11.1–15.9)
IMMATURE GRANULOCYTES: 0 %
LYMPHOCYTES ABSOLUTE COUNT: 1.7 10*3/uL (ref 0.7–3.1)
LYMPHOCYTES RELATIVE PERCENT: 31 %
MEAN CORPUSCULAR HEMOGLOBIN CONC: 33.4 g/dL (ref 31.5–35.7)
MEAN CORPUSCULAR HEMOGLOBIN: 31.4 pg (ref 26.6–33.0)
MEAN CORPUSCULAR VOLUME: 94 fL (ref 79–97)
MONOCYTES ABSOLUTE COUNT: 0.4 10*3/uL (ref 0.1–0.9)
MONOCYTES RELATIVE PERCENT: 7 %
NEUTROPHILS ABSOLUTE COUNT: 3.3 10*3/uL (ref 1.4–7.0)
NEUTROPHILS RELATIVE PERCENT: 59 %
PLATELET COUNT: 202 10*3/uL (ref 150–450)
RED BLOOD CELL COUNT: 4.33 x10E6/uL (ref 3.77–5.28)
RED CELL DISTRIBUTION WIDTH: 12.6 % (ref 11.7–15.4)
WHITE BLOOD CELL COUNT: 5.6 10*3/uL (ref 3.4–10.8)

## 2021-11-26 LAB — PROTIME-INR
INR: 1 (ref 0.9–1.2)
PROTHROMBIN TIME: 10.5 s (ref 9.1–12.0)

## 2021-11-26 LAB — AFP TUMOR MARKER: AFP-TUMOR MARKER: 4.6 ng/mL (ref 0.0–9.2)

## 2021-12-06 ENCOUNTER — Encounter: Payer: Self-pay | Admitting: Dermatology

## 2021-12-18 ENCOUNTER — Encounter: Payer: BC Managed Care – PPO | Admitting: Internal Medicine

## 2021-12-23 NOTE — Unmapped (Signed)
[facility] has made a second and final attempt to reach this patient to refill the following medication: Mycophenolate.      We have left voicemails on the following phone numbers: [phone] and have sent a MyChart message.    Dates contacted: 6/9, 6/12  Last scheduled delivery: shipped 5/15    The patient may be at risk of non-compliance with this medication. The patient should call the [facility] at [phone]  Option 4, then Option 2 (all other specialty patients) to refill medication.    [name]   [facility] Specialty Technician

## 2021-12-27 NOTE — Unmapped (Signed)
[facility] Refill Coordination Note    Specialty Medication(s) to be Shipped:   Infectious Disease: Mycophenolate    Other medication(s) to be shipped: No additional medications requested for fill at this time     [name], DOB: 12/03/1960  Phone: [phone] (home)       All above HIPAA information was verified with patient.     Was a Nurse, learning disability used for this call? No    Completed refill call assessment today to schedule patient's medication shipment from the [facility] [phone]).  All relevant notes have been reviewed.     Specialty medication(s) and dose(s) confirmed: Regimen is correct and unchanged.   Changes to medications: Christiann reports no changes at this time.  Changes to insurance: No  New side effects reported not previously addressed with a pharmacist or physician: None reported  Questions for the pharmacist: No    Confirmed patient received a Conservation officer, historic buildings and a Surveyor, mining with first shipment. The patient will receive a drug information handout for each medication shipped and additional FDA Medication Guides as required.       DISEASE/MEDICATION-SPECIFIC INFORMATION        N/A    SPECIALTY MEDICATION ADHERENCE     Medication Adherence    Patient reported X missed doses in the last month: 0  Specialty Medication: mycophenolate 500 mg  Patient is on additional specialty medications: No  Informant: patient              Were doses missed due to medication being on hold? No    Mycophenolate 500mg  : 10 days of medicine on hand       REFERRAL TO PHARMACIST     Referral to the pharmacist: Not needed      [facility]     Shipping address confirmed in Epic.     Delivery Scheduled: Yes, Expected medication delivery date: 01/01/22.     Medication will be delivered via UPS to the temporary address in Epic WAM.    Margaret Berger Margaret Berger   [facility] Specialty Technician

## 2022-01-01 MED FILL — MYCOPHENOLATE MOFETIL 500 MG TABLET: ORAL | 30 days supply | Qty: 120 | Fill #4

## 2022-01-28 NOTE — Unmapped (Signed)
[facility] Refill Coordination Note    Specialty Medication(s) to be Shipped:   Infectious Disease: Mycophenolate    Other medication(s) to be shipped: No additional medications requested for fill at this time     Margaret Berger, DOB: March 13, 1961  Phone: [phone] (home)       All above HIPAA information was verified with patient.     Was a Nurse, learning disability used for this call? No    Completed refill call assessment today to schedule patient's medication shipment from the [facility] [phone]).  All relevant notes have been reviewed.     Specialty medication(s) and dose(s) confirmed: Regimen is correct and unchanged.   Changes to medications: Symphani reports no changes at this time.  Changes to insurance: No  New side effects reported not previously addressed with a pharmacist or physician: None reported  Questions for the pharmacist: No    Confirmed patient received a Conservation officer, historic buildings and a Surveyor, mining with first shipment. The patient will receive a drug information handout for each medication shipped and additional FDA Medication Guides as required.       DISEASE/MEDICATION-SPECIFIC INFORMATION        N/A    SPECIALTY MEDICATION ADHERENCE     Medication Adherence    Patient reported X missed doses in the last month: 0  Specialty Medication: mycophenolate 500mg               Were doses missed due to medication being on hold? No    Mycophenolate 500mg   : 14 days of medicine on hand       REFERRAL TO PHARMACIST     Referral to the pharmacist: Not needed      [facility]     Shipping address confirmed in Epic.     Delivery Scheduled: Yes, Expected medication delivery date: 7/20.     Medication will be delivered via Next Day Courier to the prescription address in Epic WAM.    Margaret Berger   [facility] Specialty Technician

## 2022-01-29 MED FILL — MYCOPHENOLATE MOFETIL 500 MG TABLET: ORAL | 30 days supply | Qty: 120 | Fill #5

## 2022-01-29 NOTE — Unmapped (Signed)
Patient with cirrhosis and autoimmune hepatitis on Cellcept. Just diagnosed today with +COVID. Flu symptoms. Has Paxlovid script from prior which she will start taking. Follow-up with PCP/me as needed if worsening symptoms.     [name], M.D., FAASLD  Professor of Medicine  Division of GI and Hepatology  [facility] [place] [facility] [place]    [phone]

## 2022-02-26 ENCOUNTER — Ambulatory Visit: Admit: 2022-02-26 | Discharge: 2022-02-26 | Payer: PRIVATE HEALTH INSURANCE

## 2022-02-26 ENCOUNTER — Ambulatory Visit
Admit: 2022-02-26 | Discharge: 2022-02-26 | Payer: PRIVATE HEALTH INSURANCE | Attending: Gastroenterology | Primary: Gastroenterology

## 2022-02-26 DIAGNOSIS — I864 Gastric varices: Secondary | ICD-10-CM | POA: Diagnosis not present

## 2022-02-26 DIAGNOSIS — K7460 Unspecified cirrhosis of liver: Secondary | ICD-10-CM | POA: Diagnosis not present

## 2022-02-26 DIAGNOSIS — M5481 Occipital neuralgia: Secondary | ICD-10-CM | POA: Diagnosis not present

## 2022-02-26 DIAGNOSIS — K754 Autoimmune hepatitis: Secondary | ICD-10-CM | POA: Diagnosis not present

## 2022-02-26 DIAGNOSIS — Z6824 Body mass index (BMI) 24.0-24.9, adult: Secondary | ICD-10-CM | POA: Diagnosis not present

## 2022-02-26 LAB — PROTIME-INR
INR: 0.91
PROTIME: 10.3 s (ref 9.8–12.8)

## 2022-02-26 LAB — CBC W/ AUTO DIFF
BASOPHILS ABSOLUTE COUNT: 0 10*9/L (ref 0.0–0.1)
BASOPHILS RELATIVE PERCENT: 0.4 %
EOSINOPHILS ABSOLUTE COUNT: 0.1 10*9/L (ref 0.0–0.5)
EOSINOPHILS RELATIVE PERCENT: 2.8 %
HEMATOCRIT: 42 % (ref 34.0–44.0)
HEMOGLOBIN: 14.1 g/dL (ref 11.3–14.9)
LYMPHOCYTES ABSOLUTE COUNT: 1.4 10*9/L (ref 1.1–3.6)
LYMPHOCYTES RELATIVE PERCENT: 29.6 %
MEAN CORPUSCULAR HEMOGLOBIN CONC: 33.5 g/dL (ref 32.0–36.0)
MEAN CORPUSCULAR HEMOGLOBIN: 31.6 pg (ref 25.9–32.4)
MEAN CORPUSCULAR VOLUME: 94.2 fL (ref 77.6–95.7)
MEAN PLATELET VOLUME: 7.8 fL (ref 6.8–10.7)
MONOCYTES ABSOLUTE COUNT: 0.3 10*9/L (ref 0.3–0.8)
MONOCYTES RELATIVE PERCENT: 6.6 %
NEUTROPHILS ABSOLUTE COUNT: 2.8 10*9/L (ref 1.8–7.8)
NEUTROPHILS RELATIVE PERCENT: 60.6 %
PLATELET COUNT: 160 10*9/L (ref 150–450)
RED BLOOD CELL COUNT: 4.46 10*12/L (ref 3.95–5.13)
RED CELL DISTRIBUTION WIDTH: 13.5 % (ref 12.2–15.2)
WBC ADJUSTED: 4.6 10*9/L (ref 3.6–11.2)

## 2022-02-26 LAB — COMPREHENSIVE METABOLIC PANEL
ALBUMIN: 4.3 g/dL (ref 3.4–5.0)
ALKALINE PHOSPHATASE: 53 U/L (ref 46–116)
ALT (SGPT): 15 U/L (ref 10–49)
ANION GAP: 8 mmol/L (ref 5–14)
AST (SGOT): 21 U/L (ref <=34)
BILIRUBIN TOTAL: 0.7 mg/dL (ref 0.3–1.2)
BLOOD UREA NITROGEN: 14 mg/dL (ref 9–23)
BUN / CREAT RATIO: 23
CALCIUM: 9.5 mg/dL (ref 8.7–10.4)
CHLORIDE: 107 mmol/L (ref 98–107)
CO2: 25.1 mmol/L (ref 20.0–31.0)
CREATININE: 0.6 mg/dL
EGFR CKD-EPI (2021) FEMALE: >90 mL/min/{1.73_m2} (ref >=60)
GLUCOSE RANDOM: 108 mg/dL — ABNORMAL HIGH (ref 70–99)
POTASSIUM: 4 mmol/L (ref 3.4–4.8)
PROTEIN TOTAL: 7 g/dL (ref 5.7–8.2)
SODIUM: 140 mmol/L (ref 135–145)

## 2022-02-26 LAB — HEMOGLOBIN A1C
ESTIMATED AVERAGE GLUCOSE: 108 mg/dL
HEMOGLOBIN A1C: 5.4 % (ref 4.8–5.6)

## 2022-02-26 LAB — AFP TUMOR MARKER: AFP-TUMOR MARKER: 3 ng/mL (ref <=8)

## 2022-02-26 MED ADMIN — gadobenate dimeglumine (MULTIHANCE) 529 mg/mL (0.1mmol/0.2mL) solution 8 mL: 8 mL | INTRAVENOUS | @ 14:00:00 | Stop: 2022-02-26

## 2022-02-26 NOTE — Unmapped (Signed)
Autoimmune hepatitis in remission. Continue current medication. Labs today. Will check results of MRI from today. Labs in 3 month. Return in 6 months with MRI same day.

## 2022-02-26 NOTE — Unmapped (Signed)
[facility] LIVER CENTER    Margaret Berger, M.D.  Professor of Medicine  Director, [facility]  [place] [facility] [place]    [phone]    Margaret Berger    Referring MD: Margaret Minium, MD      Chief complaint: Office follow-up for elevation of liver enzymes. Hospitalization (08/2016) for acute hepatitis of uncertain etiology, presumed autoimmune hepatitis treated with prednisone and mycophenylate. DRUG FEVER ASSOCIATED WITH AZATHIOPRINE. STEROIDS STOPPED 07/2017. CONTINUES ON CELLCEPT 1 GRAM BID FOR IMMUNOSUPPRESSION    Interval history: Patient continues to feel great. She had Covid about 2 weeks ago and was treated with Paxlovid. No residual symptoms. Riding horses regularly. She denies fever, cough, sob, N/V, abdominal pain, chest pain, or skin rash.  She has been maintained on mycophenylate 1gram bid. No new health related concerns. Has neck pain and is scheduled to see orthopedist.     Present illness at presentation:  Patient is a 61 y.o. Caucasian female who was hospitalized at [facility] between approximately February 12 -  August 29, 2016 with acute hepatitis. The patient had about 10 weeks of abnormal liver tests documented by her primary physician and her primary gastroenterologist. This was preceded by a short flu-like illness characterized by fatigue, muscle aches, and low-grade fever. All of the symptoms have completely remained resolved since her  Visit 09/10/2016. However she was left with elevated liver tests and had an extensive workup as an outpatient and subsequently as an inpatient at [facility] to further define the etiology of her liver disease.  Additional details of her history and prior outpatient evaluation as well as her inpatient evaluation are available from our initial consultation note and follow-up notes while she was hospitalized.       After review of the liver biopsy and all serologic data thus far which has been unrevealing, it was felt that a therapeutic trial of prednisone for presumed seronegative autoimmune hepatitis was warranted. Prednisone  40 mg of daily started on 08/29/16. She had a good biochemical response with marked improvement in ALT, INR, including a slow but steady decrease in Total/direct bilirubin.     Patient had an extensive serological evaluation which was not revealing:  1. Antinuclear antibody and anti-smooth muscle antibody were negative. Immunoglobulin G was not elevated (1061)  2. Viral serologies including acute hepatitis A, hepatitis B, hepatitis C, HCV RNA, EBV PCR, CMV PCR, HHV-6 were all negative.  3. Liver ultrasound demonstrated patent vasculature with a nodular-appearing heterogeneous liver.  4. Percutaneous liver biopsy 08/2016 at [facility]:    A: Liver, core biopsy   - Severe active hepatitis with confluent areas of periportal hepatocellular necrosis (approximately 10-20% of sampled parenchyma) (see comment)   - Trichrome stain demonstrates at least periportal fibrosis and is suspicious for bridging fibrosis     Potential etiologies to consider include, but are not limited to, infection, drug/toxin-induced liver injury, seronegative autoimmune hepatitis, and metabolic disorders. The trichrome stain findings suggest that this process is at least subacute in duration.      Liver biopsy slides were personally reviewed by Margaret Berger  with the pathologist.  No viral inclusions were noted and only few plasma cells were visualized in the liver biopsy which was an adequate specimen. No cytopathic effect. EBV immunostains were negative. Fe in hepatocytes only grade 1/4    5.  Evaluation for Zoster: Margaret Berger received phone call from patient's primary GI MD that patient's Zoster IgM from 09/05/2016 was positive with  titer 1.17 approximately one week after starting prednisone. Review of her record though revealed this test to have been negative on 08/29/2016.  Margaret Berger discussed these findings with Margaret Berger and the decision was made to proceed with patient starting Valtrex 500 mg bid.  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   Recommendations from ID: Continue valacyclovir 500 mg bid.  Advised readdressing stopping Valtrex if her repeat IgM is negative or once she is on <15 mg prednisone daily.  Valtrex was discontinued last week.  Bactrim prophylaxis while prednisone >15 mg q 24. 1 DS tablet M/W/F. The following vaccines were recommended for her to obtain under the care of her PCP: Twinrix, PCV13, and PPSV23. She should have these performed by her PCP when prednisone dose is  <20 mg daily.     Immunosuppressive history:  Patient was started on azathioprine for steroid sparing benefits in early April.  After 2 weeks patient developed fevers. ID workup negative during hospitalization.  Azathioprine was held during that hospitalization and fevers resolved. When she restarted azathioprine as outpatient, immediately had high fevers and shaking chills.  This rechallenge was highly suggestive of drug fever from azathioprine.    On 5/12 patient was started on CellCept 500mg  bid and continued on prednisone 15 mg daily.  On 12/03/16, liver enzymes remained elevated. Cellcept was increased to 1000mg  BID on 12/04/16. Prednisone tapered to off as of 07/2017.     10 sys ROS otherwise negative.     Past medical history:  Patient has generally been healthy until the current hepatitis began in December 2017.  1. No history of diabetes, coronary artery disease, hypertension, or lung disease.  2. No history of other autoimmune systemic diseases  3. Excision of incidental benign adnexal cyst  3. FIBROSCAN 11/05/16: 17.7 kps c/w stage F4 (Patient ate breakfast 3 hours prior).  4. MRI 10/2017: HEPATOBILIARY: Nodular liver contour compatible with history of cirrhosis and similar to prior. Again seen is a geographic region of T1 hypointensity and corresponding slight T2 hyperintensity in the posterior right hepatic lobe which is overall similar in extent when compared to prior imaging. This region demonstrates progressive postcontrast enhancement as before. Overall similar degree of capsular retraction most pronounced along the right anterior margin of the liver (13:29). No discrete early arterially enhancing or washout liver lesion identified. No biliary ductal dilatation. Similar appearance and position of the borderline hydropic gallbladder.  PANCREAS: Pancreas divisum. No ductal dilatation.  5. MRI 06/2020 and 12/2020: Morphologic features consistent with history of cirrhosis. No sequela of portal hypertension.No MR evidence of HCC.  Similar appearing probable confluent fibrosis in the right hepatic lobe.   6. MRI 02/2022: Cirrhosis, No liver masses.  Evidence of portal HTN, Confluent fibrosis unchanged.   7. EGD 06/2021: No varices      Allergies   Allergen Reactions   ??? Azathioprine Other (See Comments)     Fever  Other reaction(s): Other (See Comments)  Fever       Current Outpatient Medications   Medication Sig Dispense Refill   ??? mycophenolate (CELLCEPT) 500 mg tablet Take 2 tablets (1,000 mg total) by mouth 2 (two) times a day with meals. 360 tablet 3     No current facility-administered medications for this visit.     Social history: The patient is married. She works as a Materials engineer. They have traveled extensively.  She does not drink any alcohol. She does not smoke cigarettes.    Family history: Negative for liver disease or liver cancer. Negative for autoimmune systemic diseases.    BP 152/85  - Pulse 65  - Temp 36 ??C (96.8 ??F) (Temporal)  - Ht 167.6 cm (5' 6)  - Wt 69.2 kg (152 lb 9.6 oz)  - SpO2 100%  - BMI 24.63 kg/m??     Pleasant individual in NAD    HEENT: Sclera are anicteric, no temporal muscle loss  NECK: No thyromegaly or lymphadenopathy, No carotid bruits  Chest: Clear to auscultation and percussion  Heart: S1, S2, RR, No murmurs  Abdomen: Soft, non-tender, non-distended, no hepatosplenomegaly, no masses appreciated, no ascites  Skin: No spider angiomata, No rashes  Extremities: Without pedal edema, no palmar erythema  Neuro: Grossly intact, No focal deficits      Results for orders placed or performed in visit on 02/26/22   AFP tumor marker   Result Value Ref Range    AFP-Tumor Marker 3 <=8 ng/mL   PT-INR   Result Value Ref Range    PT 10.3 9.8 - 12.8 sec    INR 0.91    Comprehensive Metabolic Panel   Result Value Ref Range    Sodium 140 135 - 145 mmol/L    Potassium 4.0 3.4 - 4.8 mmol/L    Chloride 107 98 - 107 mmol/L    CO2 25.1 20.0 - 31.0 mmol/L    Anion Gap 8 5 - 14 mmol/L    BUN 14 9 - 23 mg/dL    Creatinine 1.61 0.96 - 0.80 mg/dL    BUN/Creatinine Ratio 23     eGFR CKD-EPI (2021) Female >90 >=60 mL/min/1.21m2    Glucose 108 (H) 70 - 99 mg/dL    Calcium 9.5 8.7 - 04.5 mg/dL    Albumin 4.3 3.4 - 5.0 g/dL    Total Protein 7.0 5.7 - 8.2 g/dL    Total Bilirubin 0.7 0.3 - 1.2 mg/dL    AST 21 <=40 U/L    ALT 15 10 - 49 U/L    Alkaline Phosphatase 53 46 - 116 U/L   Hemoglobin A1c   Result Value Ref Range    Hemoglobin A1C 5.4 4.8 - 5.6 %    Estimated Average Glucose 108 mg/dL   CBC w/ Differential   Result Value Ref Range    WBC 4.6 3.6 - 11.2 10*9/L    RBC 4.46 3.95 - 5.13 10*12/L    HGB 14.1 11.3 - 14.9 g/dL    HCT 98.1 19.1 - 47.8 %    MCV 94.2 77.6 - 95.7 fL    MCH 31.6 25.9 - 32.4 pg    MCHC 33.5 32.0 - 36.0 g/dL    RDW 29.5 62.1 - 30.8 %    MPV 7.8 6.8 - 10.7 fL    Platelet 160 150 - 450 10*9/L    Neutrophils % 60.6 %    Lymphocytes % 29.6 %    Monocytes % 6.6 %    Eosinophils % 2.8 %    Basophils % 0.4 %    Absolute Neutrophils 2.8 1.8 - 7.8 10*9/L    Absolute Lymphocytes 1.4 1.1 - 3.6 10*9/L    Absolute Monocytes 0.3 0.3 - 0.8 10*9/L    Absolute Eosinophils 0.1 0.0 - 0.5 10*9/L    Absolute Basophils 0.0 0.0 - 0.1 10*9/L     MELD 3.0: 7 at 11/25/2021  1:06 PM  MELD-Na: 6 at  11/25/2021  1:06 PM  Calculated from:  Serum Creatinine: 0.70 mg/dL (Using min of 1 mg/dL) at 1/61/0960  4:54 PM  Serum Sodium: 140 mmol/L (Using max of 137 mmol/L) at 11/25/2021  1:06 PM  Total Bilirubin: 0.5 mg/dL (Using min of 1 mg/dL) at 0/98/1191  4:78 PM  Serum Albumin: 5.0 g/dL (Using max of 3.5 g/dL) at 2/95/6213  0:86 PM  INR(ratio): 1.0 at 11/25/2021  1:06 PM  Age at listing (hypothetical): 60 years  Sex: Female at 11/25/2021  1:06 PM        Impression:  1. Seronegative autoimmune hepatitis based on response to immunosuppressive medication. The patient presented with severe hepatitis as evidenced by hyperbilirubinemia, elevation of her INR, decreased albumin, and results from her liver biopsy. Patient was started on a therapeutic trial of prednisone for the reasons discussed above. After only 4 days of immunosuppression, her liver enzymes (ALT)  that had been regularly elevated above 1000 decreased substantially.  This was highly suggestive of a positive effect of the prednisone and was c/w  presumed ANA-negative/SMA-negative autoimmune hepatitis.Labs showed continued normalization of bilirubin and INR, although she remained with mildly elevated ALT in the 40-45 range. CellCept was added on 5/12 and increased to 1000mg  BID on 12/04/16.Steroids tapered to off as of 07/2017.    We will maintain current level of CellCept.  ALT normal.  Patient feels completely well and labs stable. In remission on CellCept.     2. Cirrhosis, well compensated:  FIBROSCAN suggestive of cirrhosis and MRI suggestive of nodular contour with focal fibrosis.  Liver biopsy during acute episode did demonstrate possible bridging fibrosis. Inflammation now resolved.    3. HCC surveillance: 02/2022: Findings of cirrhosis as described above.  No liver masses.   Repeat in 6 months at next visit.      4. Evaluate portal HTN: MRI from 6/22 showed some changes c/w increasing portal HTN despite remission of autoimmune hepatitis. Cirrhotic stigmata with evidence of increased portal pressures including recannulized umbilical vein and small caliber upper abdominal varices. EGD 06/2021: No varices. Mild esophagitis.      5. Elevated non-fasting glucose- HgbA1C 5.4 in 02/2022.    RTC in 6 months with MRI same day. Labs at 3 months interval while on immunosuppression.      Margaret Berger, M.D.  Professor of Medicine  Director, [facility]  [place] [facility] [place] [facility] [place]    [phone]

## 2022-02-27 ENCOUNTER — Other Ambulatory Visit: Payer: Self-pay | Admitting: Otolaryngology

## 2022-02-27 DIAGNOSIS — M5481 Occipital neuralgia: Secondary | ICD-10-CM

## 2022-02-27 DIAGNOSIS — M542 Cervicalgia: Secondary | ICD-10-CM

## 2022-02-27 NOTE — Unmapped (Signed)
[facility] Refill Coordination Note    Specialty Medication(s) to be Shipped:   Transplant: mycophenolate mofetil 500mg     Other medication(s) to be shipped: No additional medications requested for fill at this time     Margaret Berger, DOB: 1961-01-12  Phone: [phone] (home)       All above HIPAA information was verified with patient.     Was a Nurse, learning disability used for this call? No    Completed refill call assessment today to schedule patient's medication shipment from the [facility] [phone]).  All relevant notes have been reviewed.     Specialty medication(s) and dose(s) confirmed: Regimen is correct and unchanged.   Changes to medications: Margaret Berger reports no changes at this time.  Changes to insurance: No  New side effects reported not previously addressed with a pharmacist or physician: None reported  Questions for the pharmacist: No    Confirmed patient received a Conservation officer, historic buildings and a Surveyor, mining with first shipment. The patient will receive a drug information handout for each medication shipped and additional FDA Medication Guides as required.       DISEASE/MEDICATION-SPECIFIC INFORMATION        N/A    SPECIALTY MEDICATION ADHERENCE     Medication Adherence    Patient reported X missed doses in the last month: 0  Specialty Medication: mycophenolate 500 mg  Patient is on additional specialty medications: No                                Were doses missed due to medication being on hold? No    mycophenolate 500 mg: 10 days of medicine on hand        REFERRAL TO PHARMACIST     Referral to the pharmacist: Not needed      [facility]     Shipping address confirmed in Epic.     Delivery Scheduled: Yes, Expected medication delivery date: 03/06/22.     Medication will be delivered via UPS to the prescription address in Epic WAM.    Unk Lightning   [facility] Specialty Technician

## 2022-03-05 ENCOUNTER — Ambulatory Visit
Admission: RE | Admit: 2022-03-05 | Discharge: 2022-03-05 | Disposition: A | Discharge status: Home / Self Care | Payer: BC Managed Care – PPO | Source: Ambulatory Visit | Attending: Otolaryngology | Admitting: Otolaryngology

## 2022-03-05 ENCOUNTER — Ambulatory Visit: Admission: RE | Admit: 2022-03-05 | Payer: BC Managed Care – PPO | Source: Ambulatory Visit

## 2022-03-05 ENCOUNTER — Other Ambulatory Visit: Payer: Self-pay | Admitting: Otolaryngology

## 2022-03-05 DIAGNOSIS — M5481 Occipital neuralgia: Secondary | ICD-10-CM

## 2022-03-05 DIAGNOSIS — M542 Cervicalgia: Secondary | ICD-10-CM

## 2022-03-05 DIAGNOSIS — R202 Paresthesia of skin: Secondary | ICD-10-CM | POA: Diagnosis not present

## 2022-03-05 DIAGNOSIS — M4802 Spinal stenosis, cervical region: Secondary | ICD-10-CM | POA: Diagnosis not present

## 2022-03-05 DIAGNOSIS — R200 Anesthesia of skin: Secondary | ICD-10-CM | POA: Diagnosis not present

## 2022-03-05 MED FILL — MYCOPHENOLATE MOFETIL 500 MG TABLET: ORAL | 30 days supply | Qty: 120 | Fill #6

## 2022-03-24 ENCOUNTER — Other Ambulatory Visit: Payer: Self-pay | Admitting: Internal Medicine

## 2022-03-24 DIAGNOSIS — Z1231 Encounter for screening mammogram for malignant neoplasm of breast: Secondary | ICD-10-CM

## 2022-03-26 NOTE — Progress Notes (Signed)
Referring Physician:  No referring provider defined for this encounter.  Primary Physician:  Crecencio Mc, MD  History of Present Illness: 03/27/2022 Ms. Jessica Zuniga is here today with a chief complaint of discomfort in her left posterior scalp.  She describes hypersensitivity as well as numbness and tingling.  It has been going on for several months and has been worsening over time.  At the same time, she has had some blurriness in her left eye at times.  This been worse over the last 3 to 4 weeks.  She has no pain or discomfort in her arms.  Left occipital numbness/tingling/aching  Blurry vision in left eye sometimes, worsening over the past 3-4 weeks. Last saw eye Dr in December  Tingling comes and goes Numbness and aching is constant   Bowel/Bladder Dysfunction: none  Conservative measures:  Physical therapy:  has not participated Multimodal medical therapy including regular antiinflammatories: none  Injections:  has not received epidural steroid injections  Past Surgery: denies  Jessica Zuniga has no symptoms of cervical myelopathy.  The symptoms are causing a significant impact on the patient's life.   Review of Systems:  A 10 point review of systems is negative, except for the pertinent positives and negatives detailed in the HPI.  Past Medical History: Past Medical History:  Diagnosis Date   Chest pain    Hepatitis    autoimmune hepatitis, under control with cellcept and steroids   HLD (hyperlipidemia)    Ovarian cyst 08/2019   left   Papilloma of breast    right breast    Past Surgical History: Past Surgical History:  Procedure Laterality Date   BREAST BIOPSY Right 12/2012   stereo/ clip, SCLEROSING ADENOSIS AND APOCRINE METAPLASIA   BREAST EXCISIONAL BIOPSY Right 1997   papilloma   BREAST SURGERY Right 1997   papilloma    COLONOSCOPY WITH PROPOFOL N/A 01/07/2021   Procedure: COLONOSCOPY WITH PROPOFOL;  Surgeon: Lucilla Lame, MD;  Location:  [facility];  Service: Endoscopy;  Laterality: N/A;   KNEE ARTHROSCOPY WITH ANTERIOR CRUCIATE LIGAMENT (ACL) REPAIR Left 2015   screws in knee   LAPAROSCOPIC BILATERAL SALPINGO OOPHERECTOMY N/A 09/08/2019   Procedure: LAPAROSCOPIC LEFT SALPINGO OOPHORECTOMY;  Surgeon: Will Bonnet, MD;  Location: [facility];  Service: Gynecology;  Laterality: N/A;   LAPAROSCOPIC UNILATERAL SALPINGECTOMY Right 09/08/2019   Procedure: LAPAROSCOPIC UNILATERAL SALPINGECTOMY;  Surgeon: Will Bonnet, MD;  Location: [facility];  Service: Gynecology;  Laterality: Right;   POLYPECTOMY N/A 01/07/2021   Procedure: POLYPECTOMY;  Surgeon: Lucilla Lame, MD;  Location: [facility];  Service: Endoscopy;  Laterality: N/A;    Allergies: Allergies as of 03/27/2022 - Review Complete 03/27/2022  Allergen Reaction Noted   Azathioprine  11/05/2016    Medications: Current Meds  Medication Sig   gabapentin (NEURONTIN) 100 MG capsule Take 1 capsule (100 mg total) by mouth 3 (three) times daily.    Social History: Social History   Tobacco Use   Smoking status: Never   Smokeless tobacco: Never   Tobacco comments:    tobacco use - no  Vaping Use   Vaping Use: Never used  Substance Use Topics   Alcohol use: Not Currently   Drug use: No    Family Medical History: Family History  Problem Relation Age of Onset   Heart attack Father        heart transplant   Heart disease Paternal Grandmother    Heart disease Maternal Grandmother  Breast cancer Neg Hx     Physical Examination: Vitals:   03/27/22 1304  BP: (!) 159/83  Pulse: 73    General: Patient is well developed, well nourished, calm, collected, and in no apparent distress. Attention to examination is appropriate.  Neck:   Supple.  Full range of motion.  Respiratory: Patient is breathing without any difficulty.   NEUROLOGICAL:     Awake, alert, oriented to person, place, and time.  Speech is clear and fluent. Fund of knowledge  is appropriate.   Cranial Nerves: Pupils equal round and reactive to light.  Facial tone is symmetric.  Facial sensation is symmetric. Shoulder shrug is symmetric. Tongue protrusion is midline.  There is no pronator drift.  ROM of spine: full.    Strength: Side Biceps Triceps Deltoid Interossei Grip Wrist Ext. Wrist Flex.  R '5 5 5 5 5 5 5  '$ L '5 5 5 5 5 5 5   '$ Side Iliopsoas Quads Hamstring PF DF EHL  R '5 5 5 5 5 5  '$ L '5 5 5 5 5 5   '$ Reflexes are 1+ and symmetric at the biceps, triceps, brachioradialis, patella and achilles.   Hoffman's is absent.  Clonus is not present.  Toes are down-going.  Bilateral upper and lower extremity sensation is intact to light touch.   She has some tenderness on touching her skin of her left posterior scalp. No evidence of dysmetria noted.  Gait is normal.     Medical Decision Making  Imaging: MRI C spine 03/06/22 IMPRESSION: 1. 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 Mild degenerative spondylolisthesis at both C3-C4 and C7-T1. Facet degeneration at C7-T1, with possible small degenerative synovial cyst at the left neural foramen. Query Left C8 radiculitis.   2. Advanced disc and endplate degeneration at C4-C5 and C5-C6 with mild spinal stenosis and moderate to severe neural foraminal stenosis at both levels. Mild if any associated cord mass effect, no cord signal abnormality. Mild degenerative endplate marrow edema at the latter.   3. Moderate or severe neural foraminal stenosis also at the left C4 and left C7 nerve levels.     Electronically Signed   By: Genevie Ann M.D.   On: 03/06/2022 06:13  I have personally reviewed the images and agree with the above interpretation.  Assessment and Plan: Ms. Jessica Zuniga is a pleasant 61 y.o. female with left-sided occipital neuralgia.  She does not have any symptoms of cervical radiculopathy.  I have recommended that we start gabapentin 100 mg 3 times a day.  We will try that for several  weeks to see whether this improves her pain.  If she does not get adequate relief, her dose can be escalated significantly.  If medication does not work, I did discuss occipital nerve block.  She is not currently answered in that.  She will let me know how her symptoms are progressing.   I spent a total of 30 minutes in face-to-face and non-face-to-face activities related to this patient's care today.  Thank you for involving me in the care of this patient.      Florice Hindle K. Izora Ribas MD, [facility] Neurosurgery

## 2022-03-27 ENCOUNTER — Ambulatory Visit: Payer: BLUE CROSS/BLUE SHIELD | Admitting: Neurosurgery

## 2022-03-27 ENCOUNTER — Encounter: Payer: Self-pay | Admitting: Neurosurgery

## 2022-03-27 VITALS — BP 159/83 | HR 73 | Ht 66.0 in | Wt 151.6 lb

## 2022-03-27 DIAGNOSIS — M5481 Occipital neuralgia: Secondary | ICD-10-CM | POA: Diagnosis not present

## 2022-03-27 MED ORDER — GABAPENTIN 100 MG PO CAPS: 100.0000 mg | ORAL_CAPSULE | Freq: Three times a day (TID) | ORAL | 0 refills | Status: DC

## 2022-04-03 NOTE — Unmapped (Signed)
[facility] Refill Coordination Note    Specialty Medication(s) to be Shipped:   General Specialty: mycophenolate 500mg     Other medication(s) to be shipped: No additional medications requested for fill at this time     Margaret Berger, DOB: 1961-07-06  Phone: [phone] (home)       All above HIPAA information was verified with patient.     Was a Nurse, learning disability used for this call? No    Completed refill call assessment today to schedule patient's medication shipment from the [facility] [phone]).  All relevant notes have been reviewed.     Specialty medication(s) and dose(s) confirmed: Regimen is correct and unchanged.   Changes to medications: Margaret Berger reports starting the following medications: gabapentin 100mg  three times a day  Changes to insurance: No  New side effects reported not previously addressed with a pharmacist or physician: None reported  Questions for the pharmacist: No    Confirmed patient received a Conservation officer, historic buildings and a Surveyor, mining with first shipment. The patient will receive a drug information handout for each medication shipped and additional FDA Medication Guides as required.       DISEASE/MEDICATION-SPECIFIC INFORMATION        N/A    SPECIALTY MEDICATION ADHERENCE     Medication Adherence    Specialty Medication: Mycophenolate 500mg   Patient is on additional specialty medications: No  Patient is on more than two specialty medications: No                                Were doses missed due to medication being on hold? No    mycophenolate 500 mg tablet (CELLCEPT)  : 07 days of medicine on hand        REFERRAL TO PHARMACIST     Referral to the pharmacist: Not needed      [facility]     Shipping address confirmed in Epic.     Delivery Scheduled: Yes, Expected medication delivery date: 04/08/22.     Medication will be delivered via UPS to the temporary address in Epic WAM.    Margaret Berger' [facility]hem[facility] Specialty Technician

## 2022-04-07 MED FILL — MYCOPHENOLATE MOFETIL 500 MG TABLET: ORAL | 30 days supply | Qty: 120 | Fill #7

## 2022-04-29 ENCOUNTER — Ambulatory Visit
Admission: RE | Admit: 2022-04-29 | Discharge: 2022-04-29 | Disposition: A | Discharge status: Home / Self Care | Payer: BC Managed Care – PPO | Source: Ambulatory Visit | Attending: Internal Medicine | Admitting: Internal Medicine

## 2022-04-29 ENCOUNTER — Ambulatory Visit: Payer: BC Managed Care – PPO | Admitting: Dermatology

## 2022-04-29 DIAGNOSIS — L719 Rosacea, unspecified: Secondary | ICD-10-CM

## 2022-04-29 DIAGNOSIS — C44619 Basal cell carcinoma of skin of left upper limb, including shoulder: Secondary | ICD-10-CM

## 2022-04-29 DIAGNOSIS — L820 Inflamed seborrheic keratosis: Secondary | ICD-10-CM | POA: Diagnosis not present

## 2022-04-29 DIAGNOSIS — C4491 Basal cell carcinoma of skin, unspecified: Secondary | ICD-10-CM

## 2022-04-29 DIAGNOSIS — Z1283 Encounter for screening for malignant neoplasm of skin: Secondary | ICD-10-CM | POA: Diagnosis not present

## 2022-04-29 DIAGNOSIS — L821 Other seborrheic keratosis: Secondary | ICD-10-CM

## 2022-04-29 DIAGNOSIS — L814 Other melanin hyperpigmentation: Secondary | ICD-10-CM | POA: Diagnosis not present

## 2022-04-29 DIAGNOSIS — L578 Other skin changes due to chronic exposure to nonionizing radiation: Secondary | ICD-10-CM

## 2022-04-29 DIAGNOSIS — Z1231 Encounter for screening mammogram for malignant neoplasm of breast: Secondary | ICD-10-CM | POA: Insufficient documentation

## 2022-04-29 DIAGNOSIS — B079 Viral wart, unspecified: Secondary | ICD-10-CM | POA: Diagnosis not present

## 2022-04-29 DIAGNOSIS — D229 Melanocytic nevi, unspecified: Secondary | ICD-10-CM

## 2022-04-29 DIAGNOSIS — Z872 Personal history of diseases of the skin and subcutaneous tissue: Secondary | ICD-10-CM

## 2022-04-29 DIAGNOSIS — D492 Neoplasm of unspecified behavior of bone, soft tissue, and skin: Secondary | ICD-10-CM

## 2022-04-29 HISTORY — DX: Basal cell carcinoma of skin, unspecified: C44.91

## 2022-04-29 NOTE — Progress Notes (Signed)
Follow-Up Visit   Subjective  Jessica Zuniga is a 61 y.o. female who presents for the following: TBSE (No personal or fhx skin cancer. ).  The patient presents for Total-Body Skin Exam (TBSE) for skin cancer screening and mole check.  The patient has spots, moles and lesions to be evaluated, some may be new or changing and the patient has concerns that these could be cancer.   The following portions of the chart were reviewed this encounter and updated as appropriate:   Tobacco  Allergies  Meds  Problems  Med Hx  Surg Hx  Fam Hx      Review of Systems:  No other skin or systemic complaints except as noted in HPI or Assessment and Plan.  Objective  Well appearing patient in no apparent distress; mood and affect are within normal limits.  A full examination was performed including scalp, head, eyes, ears, nose, lips, neck, chest, axillae, abdomen, back, buttocks, bilateral upper extremities, bilateral lower extremities, hands, feet, fingers, toes, fingernails, and toenails. All findings within normal limits unless otherwise noted below.  right medial thigh 0.6 cm pink papule with cutaneous horn     Left Upper Arm 0.7 cm thin pink papule      face Mid face erythema     Assessment & Plan  Neoplasm of skin (2) right medial thigh  Skin / nail biopsy Type of biopsy: tangential   Informed consent: discussed and consent obtained   Timeout: patient name, date of birth, surgical site, and procedure verified   Procedure prep:  Patient was prepped and draped in usual sterile fashion Prep type:  Isopropyl alcohol Anesthesia: the lesion was anesthetized in a standard fashion   Anesthetic:  1% lidocaine w/ epinephrine 1-100,000 buffered w/ 8.4% NaHCO3 Instrument used: DermaBlade   Hemostasis achieved with: aluminum chloride   Outcome: patient tolerated procedure well   Post-procedure details: wound care instructions given   Additional details:  Petrolatum and a pressure  bandage applied  Specimen 1 - Surgical pathology Differential Diagnosis: r/o SCC  Check Margins: No 0.6 cm pink papule with cutaneous horn  Left Upper Arm  Skin / nail biopsy Type of biopsy: tangential   Informed consent: discussed and consent obtained   Timeout: patient name, date of birth, surgical site, and procedure verified   Procedure prep:  Patient was prepped and draped in usual sterile fashion Prep type:  Isopropyl alcohol Anesthesia: the lesion was anesthetized in a standard fashion   Anesthetic:  1% lidocaine w/ epinephrine 1-100,000 buffered w/ 8.4% NaHCO3 Instrument used: DermaBlade   Hemostasis achieved with: aluminum chloride   Outcome: patient tolerated procedure well   Post-procedure details: wound care instructions given   Additional details:  Petrolatum and a pressure bandage applied  Specimen 2 - Surgical pathology Differential Diagnosis: R/o BCC  Check Margins: No 0.7 cm thin pink papule   Rosacea face  Rosacea is a chronic progressive skin condition usually affecting the face of adults, causing redness and/or acne bumps. It is treatable but not curable. It sometimes affects the eyes (ocular rosacea) as well. It may respond to topical and/or systemic medication and can flare with stress, sun exposure, alcohol, exercise, topical steroids (including hydrocortisone/cortisone 10) and some foods.  Daily application of broad spectrum spf 30+ sunscreen to face is recommended to reduce flares.  Not bothersome for patient.  No grittiness or irritation of the eyes.   Patient deferred treatment at this time.    Lentigines - Scattered tan  macules - Due to sun exposure - Benign-appearing, observe - Recommend daily broad spectrum sunscreen SPF 30+ to sun-exposed areas, reapply every 2 hours as needed. - Call for any changes  Seborrheic Keratoses - Stuck-on, waxy, tan-brown papules and/or plaques  - Benign-appearing - Discussed benign etiology and  prognosis. - Observe - Call for any changes  Melanocytic Nevi - Tan-brown and/or pink-flesh-colored symmetric macules and papules - Benign appearing on exam today - Observation - Call clinic for new or changing moles - Recommend daily use of broad spectrum spf 30+ sunscreen to sun-exposed areas.   Hemangiomas - Red papules - Discussed benign nature - Observe - Call for any changes  Actinic Damage - Chronic condition, secondary to cumulative UV/sun exposure - diffuse scaly erythematous macules with underlying dyspigmentation - Recommend daily broad spectrum sunscreen SPF 30+ to sun-exposed areas, reapply every 2 hours as needed.  - Staying in the shade or wearing long sleeves, sun glasses (UVA+UVB protection) and wide brim hats (4-inch brim around the entire circumference of the hat) are also recommended for sun protection.  - Call for new or changing lesions.  Skin cancer screening performed today.  History of PreCancerous Actinic Keratosis  - site(s) of PreCancerous Actinic Keratosis clear today. - these may recur and new lesions may form requiring treatment to prevent transformation into skin cancer - observe for new or changing spots and contact [facility] for appointment if occur - photoprotection with sun protective clothing; sunglasses and broad spectrum sunscreen with SPF of at least 30 + and frequent self skin exams recommended - yearly exams by a dermatologist recommended for persons with history of PreCancerous Actinic Keratoses  Return in about 6 months (around 10/29/2022) for TBSE.  Graciella Belton, RMA, am acting as scribe for Forest Gleason, MD .  Documentation: I have reviewed the above documentation for accuracy and completeness, and I agree with the above.  Forest Gleason, MD

## 2022-04-29 NOTE — Patient Instructions (Addendum)
Recommend taking Heliocare sun protection supplement daily in sunny weather for additional sun protection. For maximum protection on the sunniest days, you can take up to 2 capsules of regular Heliocare OR take 1 capsule of Heliocare Ultra. For prolonged exposure (such as a full day in the sun), you can repeat your dose of the supplement 4 hours after your first dose. Heliocare can be purchased at [facility], at some [facility] or at www.heliocare.com.    Wound Care Instructions  Cleanse wound gently with soap and water once a day then pat dry with clean gauze. Apply a thin coat of Petrolatum (petroleum jelly, "Vaseline") over the wound (unless you have an allergy to this). We recommend that you use a new, sterile tube of Vaseline. Do not pick or remove scabs. Do not remove the yellow or white "healing tissue" from the base of the wound.  Cover the wound with fresh, clean, nonstick gauze and secure with paper tape. You may use Band-Aids in place of gauze and tape if the wound is small enough, but would recommend trimming much of the tape off as there is often too much. Sometimes Band-Aids can irritate the skin.  You should call the office for your biopsy report after 1 week if you have not already been contacted.  If you experience any problems, such as abnormal amounts of bleeding, swelling, significant bruising, significant pain, or evidence of infection, please call the office immediately.  FOR ADULT SURGERY PATIENTS: If you need something for pain relief you may take 1 extra strength Tylenol (acetaminophen) AND 2 Ibuprofen (200mg each) together every 4 hours as needed for pain. (do not take these if you are allergic to them or if you have a reason you should not take them.) Typically, you may only need pain medication for 1 to 3 days.    Melanoma ABCDEs  Melanoma is the most dangerous type of skin cancer, and is the leading cause of death from skin disease.  You are more likely to  develop melanoma if you: Have light-colored skin, light-colored eyes, or red or blond hair Spend a lot of time in the sun Tan regularly, either outdoors or in a tanning bed Have had blistering sunburns, especially during childhood Have a close family member who has had a melanoma Have atypical moles or large birthmarks  Early detection of melanoma is key since treatment is typically straightforward and cure rates are extremely high if we catch it early.   The first sign of melanoma is often a change in a mole or a new dark spot.  The ABCDE system is a way of remembering the signs of melanoma.  A for asymmetry:  The two halves do not match. B for border:  The edges of the growth are irregular. C for color:  A mixture of colors are present instead of an even brown color. D for diameter:  Melanomas are usually (but not always) greater than 6mm - the size of a pencil eraser. E for evolution:  The spot keeps changing in size, shape, and color.  Please check your skin once per month between visits. You can use a small mirror in front and a large mirror behind you to keep an eye on the back side or your body.   If you see any new or changing lesions before your next follow-up, please call to schedule a visit.  Please continue daily skin protection including broad spectrum sunscreen SPF 30+ to sun-exposed areas, reapplying every 2 hours   as needed when you're outdoors.    Due to recent changes in healthcare laws, you may see results of your pathology and/or laboratory studies on MyChart before the doctors have had a chance to review them. We understand that in some cases there may be results that are confusing or concerning to you. Please understand that not all results are received at the same time and often the doctors may need to interpret multiple results in order to provide you with the best plan of care or course of treatment. Therefore, we ask that you please give us 2 business days to  thoroughly review all your results before contacting the office for clarification. Should we see a critical lab result, you will be contacted sooner.   If You Need Anything After Your Visit  If you have any questions or concerns for your doctor, please call our main line at [phone] and press option 4 to reach your doctor's medical assistant. If no one answers, please leave a voicemail as directed and we will return your call as soon as possible. Messages left after 4 pm will be answered the following business day.   You may also send us a message via MyChart. We typically respond to MyChart messages within 1-2 business days.  For prescription refills, please ask your pharmacy to contact our office. Our fax number is [phone].  If you have an urgent issue when the clinic is closed that cannot wait until the next business day, you can page your doctor at the number below.    Please note that while we do our best to be available for urgent issues outside of office hours, we are not available 24/7.   If you have an urgent issue and are unable to reach us, you may choose to seek medical care at your doctor's office, retail clinic, urgent care center, or emergency room.  If you have a medical emergency, please immediately call 911 or go to the emergency department.  Pager Numbers  - Dr. Kowalski: [phone]  - Dr. Moye: [phone]  - Dr. Stewart: [phone]  In the event of inclement weather, please call our main line at [phone] for an update on the status of any delays or closures.  Dermatology Medication Tips: Please keep the boxes that topical medications come in in order to help keep track of the instructions about where and how to use these. Pharmacies typically print the medication instructions only on the boxes and not directly on the medication tubes.   If your medication is too expensive, please contact our office at [phone] option 4 or send us a message  through MyChart.   We are unable to tell what your co-pay for medications will be in advance as this is different depending on your insurance coverage. However, we may be able to find a substitute medication at lower cost or fill out paperwork to get insurance to cover a needed medication.   If a prior authorization is required to get your medication covered by your insurance company, please allow us 1-2 business days to complete this process.  Drug prices often vary depending on where the prescription is filled and some pharmacies may offer cheaper prices.  The website www.goodrx.com contains coupons for medications through different pharmacies. The prices here do not account for what the cost may be with help from insurance (it may be cheaper with your insurance), but the website can give you the price if you did not use any insurance.  - You   can print the associated coupon and take it with your prescription to the pharmacy.  - You may also stop by our office during regular business hours and pick up a GoodRx coupon card.  - If you need your prescription sent electronically to a different pharmacy, notify our office through [place] MyChart or by phone at [phone] option 4.     Si Usted Necesita Algo Despus de Su Visita  Tambin puede enviarnos un mensaje a travs de MyChart. Por lo general respondemos a los mensajes de MyChart en el transcurso de 1 a 2 das hbiles.  Para renovar recetas, por favor pida a su farmacia que se ponga en contacto con nuestra oficina. Nuestro nmero de fax es el [phone].  Si tiene un asunto urgente cuando la clnica est cerrada y que no puede esperar hasta el siguiente da hbil, puede llamar/localizar a su doctor(a) al nmero que aparece a continuacin.   Por favor, tenga en cuenta que aunque hacemos todo lo posible para estar disponibles para asuntos urgentes fuera del horario de oficina, no estamos disponibles las 24 horas del da, los 7 das de  la semana.   Si tiene un problema urgente y no puede comunicarse con nosotros, puede optar por buscar atencin mdica  en el consultorio de su doctor(a), en una clnica privada, en un centro de atencin urgente o en una sala de emergencias.  Si tiene una emergencia mdica, por favor llame inmediatamente al 911 o vaya a la sala de emergencias.  Nmeros de bper  - Dr. Kowalski: [phone]  - Dra. Moye: [phone]  - Dra. Stewart: [phone]  En caso de inclemencias del tiempo, por favor llame a nuestra lnea principal al [phone] para una actualizacin sobre el estado de cualquier retraso o cierre.  Consejos para la medicacin en dermatologa: Por favor, guarde las cajas en las que vienen los medicamentos de uso tpico para ayudarle a seguir las instrucciones sobre dnde y cmo usarlos. Las farmacias generalmente imprimen las instrucciones del medicamento slo en las cajas y no directamente en los tubos del medicamento.   Si su medicamento es muy caro, por favor, pngase en contacto con nuestra oficina llamando al [phone] y presione la opcin 4 o envenos un mensaje a travs de MyChart.   No podemos decirle cul ser su copago por los medicamentos por adelantado ya que esto es diferente dependiendo de la cobertura de su seguro. Sin embargo, es posible que podamos encontrar un medicamento sustituto a menor costo o llenar un formulario para que el seguro cubra el medicamento que se considera necesario.   Si se requiere una autorizacin previa para que su compaa de seguros cubra su medicamento, por favor permtanos de 1 a 2 das hbiles para completar este proceso.  Los precios de los medicamentos varan con frecuencia dependiendo del lugar de dnde se surte la receta y alguna farmacias pueden ofrecer precios ms baratos.  El sitio web www.goodrx.com tiene cupones para medicamentos de diferentes farmacias. Los precios aqu no tienen en cuenta lo que podra costar con la ayuda  del seguro (puede ser ms barato con su seguro), pero el sitio web puede darle el precio si no utiliz ningn seguro.  - Puede imprimir el cupn correspondiente y llevarlo con su receta a la farmacia.  - Tambin puede pasar por nuestra oficina durante el horario de atencin regular y recoger una tarjeta de cupones de GoodRx.  - Si necesita que su receta se enve electrnicamente a una farmacia diferente, informe a   a travs de MyChart [facility] o por telfono llamando al [phone] y presione la opcin 4.

## 2022-04-29 NOTE — Progress Notes (Signed)
Duplicate note

## 2022-05-07 ENCOUNTER — Encounter: Payer: Self-pay | Admitting: Dermatology

## 2022-05-08 ENCOUNTER — Telehealth: Payer: Self-pay

## 2022-05-08 ENCOUNTER — Other Ambulatory Visit: Payer: Self-pay

## 2022-05-08 DIAGNOSIS — C44619 Basal cell carcinoma of skin of left upper limb, including shoulder: Secondary | ICD-10-CM

## 2022-05-08 MED FILL — MYCOPHENOLATE MOFETIL 500 MG TABLET: ORAL | 30 days supply | Qty: 120 | Fill #8

## 2022-05-08 NOTE — Telephone Encounter (Signed)
Patient referral emailed to Berneta Sages at [facility].

## 2022-05-08 NOTE — Unmapped (Signed)
[facility] Clinical Assessment & Refill Coordination Note    Margaret Berger, DOB: February 20, 1961  Phone: [phone] (home)     All above HIPAA information was verified with patient.     Was a Nurse, learning disability used for this call? No    Specialty Medication(s):   Inflammatory Disorders: mycophenolate     Current Outpatient Medications   Medication Sig Dispense Refill    gabapentin (NEURONTIN) 100 MG capsule Take 1 capsule (100 mg total) by mouth Three (3) times a day.      mycophenolate (CELLCEPT) 500 mg tablet Take 2 tablets (1,000 mg total) by mouth 2 (two) times a day with meals. 360 tablet 3     No current facility-administered medications for this visit.        Changes to medications: Margaret Berger reports starting the following medications: gabapentin    Allergies   Allergen Reactions    Azathioprine Other (See Comments)     Fever  Other reaction(s): Other (See Comments)  Fever       Changes to allergies: No    SPECIALTY MEDICATION ADHERENCE     Mycophenolate 500 mg: approximately 10 days of medicine on hand       Medication Adherence    Patient reported X missed doses in the last month: 0  Specialty Medication: mycophenolate 500mg   Patient is on additional specialty medications: No  Any gaps in refill history greater than 2 weeks in the last 3 months: no  Demonstrates understanding of importance of adherence: yes  Informant: patient      Specialty medication(s) dose(s) confirmed: Regimen is correct and unchanged.     Are there any concerns with adherence? No    Adherence counseling provided? Not needed    CLINICAL MANAGEMENT AND INTERVENTION      Clinical Benefit Assessment:    Do you feel the medicine is effective or helping your condition? Yes    Labs  Comprehensive Metabolic Panel                 Component  Ref Range & Units 2 mo ago  (02/26/22) 5 mo ago  (11/25/21) 8 mo ago  (08/21/21) 1 yr ago  (04/02/21) 1 yr ago  (01/09/21) 1 yr ago  (10/01/20) 1 yr ago  (07/16/20)    Sodium  135 - 145 mmol/L 140 140 R 139 143 R 138 141 R 136 R    Potassium  3.4 - 4.8 mmol/L 4.0 4.2 R 4.9 High  5.0 R 4.1 4.7 R 4.7 R    Chloride  98 - 107 mmol/L 107 103 R 105 102 R 105 100 R 98 R    CO2  20.0 - 31.0 mmol/L 25.1 25 R 26.9 26 R 27.4 22 R 22 R    Anion Gap  5 - 14 mmol/L 8  7  6       BUN  9 - 23 mg/dL 14 14 R 15 16 R 12 20 R 11 R    Creatinine  0.60 - 0.80 mg/dL 0.98 1.19 R 1.47 8.29 R 0.65 0.90 R 0.76 R    BUN/Creatinine Ratio 23 20 R 24 20 R 18 22 R 14 R    eGFR CKD-EPI (2021) Female  >=60 mL/min/1.38m2 >90  >90 CM  >90 CM     Comment: eGFR calculated with CKD-EPI 2021 equation in accordance with [employer] and [employer] of Nephrology Task Force recommendations.    Glucose  70 - 99  mg/dL 295 High  621 High  308 High  124 High  R, CM 105 High  94 R 103 High  R    Calcium  8.7 - 10.4 mg/dL 9.5 65.7 R 84.6 9.9 R 9.9 9.7 R 10.3 R    Albumin  3.4 - 5.0 g/dL 4.3 5.0 High  R 4.4 4.9 High  R 4.2 4.9 High  R 4.9 High  R    Total Protein  5.7 - 8.2 g/dL 7.0  7.2  6.6      Total Bilirubin  0.3 - 1.2 mg/dL 0.7 0.5 R 0.8 0.3 R 0.6 0.4 R 0.4 R    AST  <=34 U/L 21 19 R 18 19 R 18 23 R 20 R    ALT  10 - 49 U/L 15 19 R 15 17 R 19 17 R 18 R    Alkaline Phosphatase  46 - 116 U/L 53 61 R 50 60 R 48 59 R 61 R, CM   Resulting Agency UN[facility] 01 UNCHET 01 UN[facility]T            Clinical Benefit counseling provided? Progress note from 02/26/22  and labs from 02/26/22 show evidence of clinical benefit    Adverse Effects Assessment:    Are you experiencing any side effects? No    Are you experiencing difficulty administering your medicine? No    Quality of Life Assessment:    How many days over the past month did your Autoimmune Hepatitis  keep you from your normal activities? For example, brushing your teeth or getting up in the morning. 0    Have you discussed this with your provider? Not needed    Acute Infection Status:    Acute infections noted within Epic:  No active infections  Patient reported infection: None    Therapy Appropriateness:    Is therapy appropriate and patient progressing towards therapeutic goals? Yes, therapy is appropriate and should be continued    DISEASE/MEDICATION-SPECIFIC INFORMATION      N/A    Chronic Inflammatory Diseases: Have you experienced any flares in the last month? No    PATIENT SPECIFIC NEEDS     Does the patient have any physical, cognitive, or cultural barriers? No    Is the patient high risk? Yes, patient is taking a REMS drug. Medication is dispensed in compliance with REMS program    Did the patient require a clinical intervention? No    Does the patient require physician intervention or other additional services (i.e., nutrition, smoking cessation, social work)? No    SOCIAL DETERMINANTS OF HEALTH     At the [facility], we have learned that life circumstances - like trouble affording food, housing, utilities, or transportation can affect the health of many of our patients.   That is why we wanted to ask: are you currently experiencing any life circumstances that are negatively impacting your health and/or quality of life? Patient declined to answer    Social Determinants of Health     Financial Resource Strain: Not on file   Internet Connectivity: Not on file   Food Insecurity: Not on file   Tobacco Use: Low Risk  (02/26/2022)    Patient History     Smoking Tobacco Use: Never     Smokeless Tobacco Use: Never     Passive Exposure: Not on file   Housing/Utilities: Not on file   Alcohol Use: Not on file   Transportation Needs: Not on file   Substance Use:  Not on file   Health Literacy: Not on file   Physical Activity: Not on file   Interpersonal Safety: Not on file   Stress: Not on file   Intimate Partner Violence: Not on file   Depression: Not on file   Social Connections: Not on file       Would you be willing to receive help with any of the needs that you have identified today? Not applicable       SHIPPING     Specialty Medication(s) to be Shipped:   Inflammatory Disorders: mycophenolate    Other medication(s) to be shipped: No additional medications requested for fill at this time     Changes to insurance: No    Delivery Scheduled: Yes, Expected medication delivery date: 05/09/22.     Medication will be delivered via UPS to the confirmed temporary address in [facility].    The patient will receive a drug information handout for each medication shipped and additional FDA Medication Guides as required.  Verified that patient has previously received a Conservation officer, historic buildings and a Surveyor, mining.    The patient or caregiver noted above participated in the development of this care plan and knows that they can request review of or adjustments to the care plan at any time.      All of the patient's questions and concerns have been addressed.    Roderic Palau, PharmD   [facility] Specialty Pharmacist

## 2022-05-09 DIAGNOSIS — C44619 Basal cell carcinoma of skin of left upper limb, including shoulder: Principal | ICD-10-CM

## 2022-05-13 NOTE — Unmapped (Signed)
VM from patient requesting to change appointment with Dr. Foy Guadalajara from 08/20/22 to 08/13/22 as they will be in [place] for another medical appointment    Attempted to reach patient & got her voicemail    Left message with available appointment times on 08/13/22 & asked her to let me know what works for her    -Lanora Manis, Charity fundraiser

## 2022-05-15 NOTE — Unmapped (Signed)
VM from patient to reschedule 08/20/22 appointment to 08/13/22 @ 1045 am     Appointment rescheduled    -Lanora Manis RN

## 2022-06-04 NOTE — Unmapped (Signed)
[facility] Refill Coordination Note    Margaret Berger, DOB: 1960/07/26  Phone: There are no phone numbers on file.      All above HIPAA information was verified with patient.         06/03/2022     1:54 PM   Specialty Rx Medication Refill Questionnaire   Which Medications would you like refilled and shipped? Mycophenolate. Have 10 days left.   Please list all current allergies: None   Have you missed any doses in the last 30 days? No   Have you had any changes to your medication(s) since your last refill? No   How many days remaining of each medication do you have at home? 10 days   Have you experienced any side effects in the last 30 days? No   Please enter the full address (street address, city, state, zip code) where you would like your medication(s) to be delivered to. [address], [place], [place] [address]   Please specify on which day you would like your medication(s) to arrive. Note: if you need your medication(s) within 3 days, please call the pharmacy to schedule your order at [phone]  06/07/2022   Has your insurance changed since your last refill? No   Would you like a pharmacist to call you to discuss your medication(s)? No   Do you require a signature for your package? (Note: if we are billing Medicare Part B or your order contains a controlled substance, we will require a signature) No     *spoke with patient and new delivery date is 11/27      Completed refill call assessment today to schedule patient's medication shipment from the [facility] [phone]).  All relevant notes have been reviewed.       Confirmed patient received a Conservation officer, historic buildings and a Surveyor, mining with first shipment. The patient will receive a drug information handout for each medication shipped and additional FDA Medication Guides as required.         REFERRAL TO PHARMACIST     Referral to the pharmacist: Not needed      [facility]     Shipping address confirmed in Epic. Delivery Scheduled: Yes, Expected medication delivery date: 11/27.     Medication will be delivered via Same Day Courier to the prescription address in Epic WAM.    Margaret Berger   [facility] Specialty Technician

## 2022-06-09 ENCOUNTER — Ambulatory Visit: Admit: 2022-06-09 | Discharge: 2022-06-10 | Payer: PRIVATE HEALTH INSURANCE

## 2022-06-09 DIAGNOSIS — L988 Other specified disorders of the skin and subcutaneous tissue: Secondary | ICD-10-CM | POA: Diagnosis not present

## 2022-06-09 DIAGNOSIS — L578 Other skin changes due to chronic exposure to nonionizing radiation: Secondary | ICD-10-CM | POA: Diagnosis not present

## 2022-06-09 DIAGNOSIS — C44619 Basal cell carcinoma of skin of left upper limb, including shoulder: Secondary | ICD-10-CM | POA: Diagnosis not present

## 2022-06-09 DIAGNOSIS — L814 Other melanin hyperpigmentation: Secondary | ICD-10-CM | POA: Diagnosis not present

## 2022-06-09 MED FILL — MYCOPHENOLATE MOFETIL 500 MG TABLET: ORAL | 30 days supply | Qty: 120 | Fill #9

## 2022-06-10 DIAGNOSIS — K754 Autoimmune hepatitis: Principal | ICD-10-CM

## 2022-06-10 NOTE — Unmapped (Signed)
ASSESSMENT AND PLAN:     1) BCC of the left upper arm treated via Mohs surgery today with primary closure for wound management   2) Diagnosis, etiology, natural history, and treatment discussed, emphasizing the importance of surveillance and photoprotection.   3) Prognosis and future surveillance discussed.  4) Letter with treatment outcome sent to referring provider.       Photodamage/future skin cancer risk: evidenced by rhytids, telangiectasias, and lentigines, and history of skin cancer  1) Currently stable, no concerning lesions on exam today. Actinic damage-sun protection measures discussed/advised.   2) Advise continued surveillance in addition to photoprotection.        INDICATION FOR MOHS SURGERY: location, size, histology, or recurrence    STAGE I: For the left upper arm timeout performed at 9:47 a.m. Informed consent obtained. Anesthesia achieved with 0.33% lidocaine with 1:200,000 epinephrine. ChloraPrep applied. one section(s) excised using Mohs technique (this includes total peripheral and deep tissue margin excision and evaluation with frozen sections, excised and interpreted by the same physician).     Frozen section analysis revealed a negative margin.      Options for management of the wound were discussed. Given the location and size/depth of the wound, Primary repair planned.  Burow's triangles planned to follow relaxed skin tension lines.  Anesthesia achieved, chloraprep applied and a sterile drape placed.  Burow's triangles were excised.  Undermining carried out minimally in a subcutaneous plane to facilitate tension-free closure.  Layered closure completed using 3.0 PDS for buried vertical mattress sutures followed by 6.0 fast absorbing gut in a running cuticular technique.    Final size of the defect: 1.6 cm   Final length of the repair: 3.7 cm     Bandage applied and wound management discussed.    Prescription for Hydrocodone 5 mg/ Acetaminophen 325 mg provided.    RTC in 1 week.        HISTORY OF PRESENT ILLNESS: [name] is seen in consultation at the request of Dr. Neale Burly  for biopsy-proven BCC.   They note that the area has been present for about a few months  increasing in size with no symptoms .  There is no history of previous treatment.  Reports no other new or changing lesions and has no other complaints today.      REVIEW OF SYSTEMS: No fevers, chills, weight loss, lymphadenopathy or other skin concerns.    PHYSICAL EXAMINATION:  GENERAL:  well-appearing in no acute distress, alert, appropriately oriented and interactive.  SKIN: Examination of the relevant anatomic areas notable for a healing biopsy site on the left upper arm. There are rhytids, telangiectasias, and lentigines, consistent with photodamage.    Biopsy report(s) reviewed, confirming the diagnosis.

## 2022-06-11 DIAGNOSIS — K754 Autoimmune hepatitis: Secondary | ICD-10-CM | POA: Diagnosis not present

## 2022-06-12 LAB — CBC W/ DIFFERENTIAL
BANDED NEUTROPHILS ABSOLUTE COUNT: 0 10*3/uL (ref 0.0–0.1)
BASOPHILS ABSOLUTE COUNT: 0 10*3/uL (ref 0.0–0.2)
BASOPHILS RELATIVE PERCENT: 0 %
EOSINOPHILS ABSOLUTE COUNT: 0.2 10*3/uL (ref 0.0–0.4)
EOSINOPHILS RELATIVE PERCENT: 2 %
HEMATOCRIT: 39.9 % (ref 34.0–46.6)
HEMOGLOBIN: 13.5 g/dL (ref 11.1–15.9)
IMMATURE GRANULOCYTES: 0 %
LYMPHOCYTES ABSOLUTE COUNT: 2 10*3/uL (ref 0.7–3.1)
LYMPHOCYTES RELATIVE PERCENT: 31 %
MEAN CORPUSCULAR HEMOGLOBIN CONC: 33.8 g/dL (ref 31.5–35.7)
MEAN CORPUSCULAR HEMOGLOBIN: 31.5 pg (ref 26.6–33.0)
MEAN CORPUSCULAR VOLUME: 93 fL (ref 79–97)
MONOCYTES ABSOLUTE COUNT: 0.4 10*3/uL (ref 0.1–0.9)
MONOCYTES RELATIVE PERCENT: 6 %
NEUTROPHILS ABSOLUTE COUNT: 3.9 10*3/uL (ref 1.4–7.0)
NEUTROPHILS RELATIVE PERCENT: 61 %
PLATELET COUNT: 242 10*3/uL (ref 150–450)
RED BLOOD CELL COUNT: 4.29 x10E6/uL (ref 3.77–5.28)
RED CELL DISTRIBUTION WIDTH: 11.8 % (ref 11.7–15.4)
WHITE BLOOD CELL COUNT: 6.5 10*3/uL (ref 3.4–10.8)

## 2022-06-12 LAB — COMPREHENSIVE METABOLIC PANEL
A/G RATIO: 2.3 — ABNORMAL HIGH (ref 1.2–2.2)
ALBUMIN: 4.9 g/dL (ref 3.9–4.9)
ALKALINE PHOSPHATASE: 67 IU/L (ref 44–121)
ALT (SGPT): 15 IU/L (ref 0–32)
AST (SGOT): 16 IU/L (ref 0–40)
BILIRUBIN TOTAL (MG/DL) IN SER/PLAS: 0.3 mg/dL (ref 0.0–1.2)
BLOOD UREA NITROGEN: 21 mg/dL (ref 8–27)
BUN / CREAT RATIO: 28 (ref 12–28)
CALCIUM: 9.9 mg/dL (ref 8.7–10.3)
CHLORIDE: 102 mmol/L (ref 96–106)
CO2: 20 mmol/L (ref 20–29)
CREATININE: 0.76 mg/dL (ref 0.57–1.00)
EGFR: 88 mL/min/{1.73_m2}
GLOBULIN, TOTAL: 2.1 g/dL (ref 1.5–4.5)
GLUCOSE: 117 mg/dL — ABNORMAL HIGH (ref 70–99)
POTASSIUM: 4.4 mmol/L (ref 3.5–5.2)
SODIUM: 140 mmol/L (ref 134–144)
TOTAL PROTEIN: 7 g/dL (ref 6.0–8.5)

## 2022-06-12 LAB — PROTIME-INR
INR: 1 (ref 0.9–1.2)
PROTHROMBIN TIME: 10.4 s (ref 9.1–12.0)

## 2022-06-12 LAB — AFP TUMOR MARKER: AFP-TUMOR MARKER: 3.8 ng/mL (ref 0.0–9.2)

## 2022-06-16 ENCOUNTER — Telehealth: Payer: Self-pay

## 2022-06-16 NOTE — Telephone Encounter (Signed)
Encounter notes scanned in from [facility] Dr. Leroy Sea merritt's office. Scanned in under the media tab for your review.

## 2022-06-25 NOTE — Telephone Encounter (Signed)
MyChart messgae sent to patient. 

## 2022-06-25 NOTE — Unmapped (Signed)
Talked with patient about using vinegar soaks to help with the infected area and then reapplying some vaseline

## 2022-07-01 DIAGNOSIS — M79672 Pain in left foot: Secondary | ICD-10-CM | POA: Diagnosis not present

## 2022-07-01 DIAGNOSIS — M76822 Posterior tibial tendinitis, left leg: Secondary | ICD-10-CM | POA: Diagnosis not present

## 2022-07-01 DIAGNOSIS — M25572 Pain in left ankle and joints of left foot: Secondary | ICD-10-CM | POA: Diagnosis not present

## 2022-07-02 NOTE — Unmapped (Signed)
[facility] Refill Coordination Note    Specialty Medication(s) to be Shipped:   Infectious Disease: Mycophenolate    Other medication(s) to be shipped: No additional medications requested for fill at this time     Margaret Berger, DOB: 05-07-1961  Phone: There are no phone numbers on file.      All above HIPAA information was verified with patient.     Was a Nurse, learning disability used for this call? No    Completed refill call assessment today to schedule patient's medication shipment from the [facility] [phone]).  All relevant notes have been reviewed.     Specialty medication(s) and dose(s) confirmed: Regimen is correct and unchanged.   Changes to medications: Margaret Berger reports no changes at this time.  Changes to insurance: No  New side effects reported not previously addressed with a pharmacist or physician: None reported  Questions for the pharmacist: No    Confirmed patient received a Conservation officer, historic buildings and a Surveyor, mining with first shipment. The patient will receive a drug information handout for each medication shipped and additional FDA Medication Guides as required.       DISEASE/MEDICATION-SPECIFIC INFORMATION        N/A    SPECIALTY MEDICATION ADHERENCE     Medication Adherence    Patient reported X missed doses in the last month: 0  Specialty Medication: mycophenolate 500mg                           Were doses missed due to medication being on hold? No    mycophenolate 500mg   : unable to confirm quantity on hand    REFERRAL TO PHARMACIST     Referral to the pharmacist: Not needed      [facility]     Shipping address confirmed in Epic.     Delivery Scheduled: Yes, Expected medication delivery date: 12/21.     Medication will be delivered via Same Day Courier to the prescription address in Epic WAM.    [name]   [facility] Specialty Technician

## 2022-07-03 ENCOUNTER — Encounter: Payer: BC Managed Care – PPO | Admitting: Dermatology

## 2022-07-03 MED FILL — MYCOPHENOLATE MOFETIL 500 MG TABLET: ORAL | 30 days supply | Qty: 120 | Fill #10

## 2022-07-15 DIAGNOSIS — M25572 Pain in left ankle and joints of left foot: Secondary | ICD-10-CM | POA: Diagnosis not present

## 2022-07-15 DIAGNOSIS — M79672 Pain in left foot: Secondary | ICD-10-CM | POA: Diagnosis not present

## 2022-07-15 DIAGNOSIS — M2142 Flat foot [pes planus] (acquired), left foot: Secondary | ICD-10-CM | POA: Diagnosis not present

## 2022-07-15 DIAGNOSIS — M76822 Posterior tibial tendinitis, left leg: Secondary | ICD-10-CM | POA: Diagnosis not present

## 2022-07-23 DIAGNOSIS — M25475 Effusion, left foot: Secondary | ICD-10-CM | POA: Diagnosis not present

## 2022-07-23 DIAGNOSIS — M2142 Flat foot [pes planus] (acquired), left foot: Secondary | ICD-10-CM | POA: Diagnosis not present

## 2022-07-23 DIAGNOSIS — M79672 Pain in left foot: Secondary | ICD-10-CM | POA: Diagnosis not present

## 2022-07-31 NOTE — Unmapped (Signed)
[facility] Refill Coordination Note    Specialty Medication(s) to be Shipped:   Infectious Disease: mycophenolate 500 mg tablet (CELLCEPT)    Other medication(s) to be shipped: No additional medications requested for fill at this time     Margaret Berger, DOB: 11-29-1960  Phone: There are no phone numbers on file.      All above HIPAA information was verified with patient.     Was a Nurse, learning disability used for this call? No    Completed refill call assessment today to schedule patient's medication shipment from the [facility] [phone]).  All relevant notes have been reviewed.     Specialty medication(s) and dose(s) confirmed: Regimen is correct and unchanged.   Changes to medications: [place] reports no changes at this time.  Changes to insurance: No  New side effects reported not previously addressed with a pharmacist or physician: None reported  Questions for the pharmacist: No    Confirmed patient received a Conservation officer, historic buildings and a Surveyor, mining with first shipment. The patient will receive a drug information handout for each medication shipped and additional FDA Medication Guides as required.       DISEASE/MEDICATION-SPECIFIC INFORMATION        N/A    SPECIALTY MEDICATION ADHERENCE     Medication Adherence    Patient reported X missed doses in the last month: 0  Specialty Medication: mycophenolate 500 mg tablet (CELLCEPT)  Patient is on additional specialty medications: No  Patient is on more than two specialty medications: No  Informant: patient  Reliability of informant: reliable                  Confirmed plan for next specialty medication refill: delivery by pharmacy  Refills needed for supportive medications: not needed          Refill Coordination    Has the Patients' Contact Information Changed: No  Is the Shipping Address Different: No         Were doses missed due to medication being on hold? No    mycophenolate 500 mg tablet (CELLCEPT)  : 10 days of medicine on hand       REFERRAL TO PHARMACIST     Referral to the pharmacist: Not needed      [facility]     Shipping address confirmed in Epic.     Delivery Scheduled: Yes, Expected medication delivery date: 08/04/2022.     Medication will be delivered via Same Day Courier to the prescription address in Epic WAM.    Yolonda Kida   [facility] Specialty Technician

## 2022-08-04 MED FILL — MYCOPHENOLATE MOFETIL 500 MG TABLET: ORAL | 30 days supply | Qty: 120 | Fill #11

## 2022-08-13 ENCOUNTER — Ambulatory Visit: Admit: 2022-08-13 | Discharge: 2022-08-13 | Payer: PRIVATE HEALTH INSURANCE

## 2022-08-13 ENCOUNTER — Ambulatory Visit
Admit: 2022-08-13 | Discharge: 2022-08-13 | Payer: PRIVATE HEALTH INSURANCE | Attending: Gastroenterology | Primary: Gastroenterology

## 2022-08-13 DIAGNOSIS — K754 Autoimmune hepatitis: Secondary | ICD-10-CM | POA: Diagnosis not present

## 2022-08-13 DIAGNOSIS — K761 Chronic passive congestion of liver: Secondary | ICD-10-CM | POA: Diagnosis not present

## 2022-08-13 DIAGNOSIS — C220 Liver cell carcinoma: Secondary | ICD-10-CM | POA: Diagnosis not present

## 2022-08-13 DIAGNOSIS — Q453 Other congenital malformations of pancreas and pancreatic duct: Secondary | ICD-10-CM | POA: Diagnosis not present

## 2022-08-13 DIAGNOSIS — K7460 Unspecified cirrhosis of liver: Secondary | ICD-10-CM | POA: Diagnosis not present

## 2022-08-13 DIAGNOSIS — K769 Liver disease, unspecified: Secondary | ICD-10-CM | POA: Diagnosis not present

## 2022-08-13 MED ADMIN — gadobenate dimeglumine (MULTIHANCE) 529 mg/mL (0.1mmol/0.2mL) solution 7 mL: 7 mL | INTRAVENOUS | @ 14:00:00 | Stop: 2022-08-13

## 2022-08-13 NOTE — Unmapped (Unsigned)
[facility]    [name], M.D.  Professor of Medicine  Director, [facility]  [place] [facility] [place] at [place]    [phone]    [name]    Referring MD: Midge Minium, MD      Chief complaint: Office follow-up for elevation of liver enzymes. Hospitalization (08/2016) for acute hepatitis of uncertain etiology, presumed autoimmune hepatitis treated with prednisone and mycophenylate. DRUG FEVER ASSOCIATED WITH AZATHIOPRINE. STEROIDS STOPPED 07/2017. CONTINUES ON CELLCEPT 1 GRAM BID FOR IMMUNOSUPPRESSION    Interval history: Patient continues to feel great. Traveling between [place] and [place]. She denies fever, cough, sob, N/V, abdominal pain, chest pain, or skin rash.  She has been maintained on mycophenylate 1gram bid. No new health related concerns. MRI today with contrast extravasation in left arm.        Present illness at presentation:  Patient is a 62 y.o. Caucasian female who was hospitalized at [facility] between approximately February 12 -  August 29, 2016 with acute hepatitis. The patient had about 10 weeks of abnormal liver tests documented by her primary physician and her primary gastroenterologist. This was preceded by a short flu-like illness characterized by fatigue, muscle aches, and low-grade fever. All of the symptoms have completely remained resolved since her  Visit 09/10/2016. However she was left with elevated liver tests and had an extensive workup as an outpatient and subsequently as an inpatient at [facility] to further define the etiology of her liver disease.  Additional details of her history and prior outpatient evaluation as well as her inpatient evaluation are available from our initial consultation note and follow-up notes while she was hospitalized.       After review of the liver biopsy and all serologic data thus far which has been unrevealing, it was felt that a therapeutic trial of prednisone for presumed seronegative autoimmune hepatitis was warranted. Prednisone  40 mg of daily started on 08/29/16. She had a good biochemical response with marked improvement in ALT, INR, including a slow but steady decrease in Total/direct bilirubin.     Patient had an extensive serological evaluation which was not revealing:  1. Antinuclear antibody and anti-smooth muscle antibody were negative. Immunoglobulin G was not elevated (1061)  2. Viral serologies including acute hepatitis A, hepatitis B, hepatitis C, HCV RNA, EBV PCR, CMV PCR, HHV-6 were all negative.  3. Liver ultrasound demonstrated patent vasculature with a nodular-appearing heterogeneous liver.  4. Percutaneous liver biopsy 08/2016 at [facility]:    A: Liver, core biopsy   - Severe active hepatitis with confluent areas of periportal hepatocellular necrosis (approximately 10-20% of sampled parenchyma) (see comment)   - Trichrome stain demonstrates at least periportal fibrosis and is suspicious for bridging fibrosis     Potential etiologies to consider include, but are not limited to, infection, drug/toxin-induced liver injury, seronegative autoimmune hepatitis, and metabolic disorders. The trichrome stain findings suggest that this process is at least subacute in duration.      Liver biopsy slides were personally reviewed by Dr. Sharon Mt  with the pathologist.  No viral inclusions were noted and only few plasma cells were visualized in the liver biopsy which was an adequate specimen. No cytopathic effect. EBV immunostains were negative. Fe in hepatocytes only grade 1/4    5.  Evaluation for Zoster: Dr. Foy Guadalajara received phone call from patient's primary GI MD that patient's Zoster IgM from 09/05/2016 was positive with titer 1.17 approximately one week after starting prednisone. Review of her  record though revealed this test to have been negative on 08/29/2016.  Dr. Sharon Mt discussed these findings with Dr. Lytle Butte and the decision was made to proceed with patient starting Valtrex 500 mg bid.  She was seen by Dr. 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   Recommendations from ID: Continue valacyclovir 500 mg bid.  Advised readdressing stopping Valtrex if her repeat IgM is negative or once she is on <15 mg prednisone daily.  Valtrex was discontinued last week.  Bactrim prophylaxis while prednisone >15 mg q 24. 1 DS tablet M/W/F. The following vaccines were recommended for her to obtain under the care of her PCP: Twinrix, PCV13, and PPSV23. She should have these performed by her PCP when prednisone dose is  <20 mg daily.     Immunosuppressive history:  Patient was started on azathioprine for steroid sparing benefits in early April.  After 2 weeks patient developed fevers. ID workup negative during hospitalization.  Azathioprine was held during that hospitalization and fevers resolved. When she restarted azathioprine as outpatient, immediately had high fevers and shaking chills.  This rechallenge was highly suggestive of drug fever from azathioprine.    On 5/12 patient was started on CellCept 500mg  bid and continued on prednisone 15 mg daily.  On 12/03/16, liver enzymes remained elevated. Cellcept was increased to 1000mg  BID on 12/04/16. Prednisone tapered to off as of 07/2017.     10 sys ROS otherwise negative.     Past medical history:  Patient has generally been healthy until the current hepatitis began in December 2017.  1. No history of diabetes, coronary artery disease, hypertension, or lung disease.  2. No history of other autoimmune systemic diseases  3. Excision of incidental benign adnexal cyst  3. FIBROSCAN 11/05/16: 17.7 kps c/w stage F4 (Patient ate breakfast 3 hours prior).  4. MRI 10/2017: HEPATOBILIARY: Nodular liver contour compatible with history of cirrhosis and similar to prior. Again seen is a geographic region of T1 hypointensity and corresponding slight T2 hyperintensity in the posterior right hepatic lobe which is overall similar in extent when compared to prior imaging. This region demonstrates progressive postcontrast enhancement as before. Overall similar degree of capsular retraction most pronounced along the right anterior margin of the liver (13:29). No discrete early arterially enhancing or washout liver lesion identified. No biliary ductal dilatation. Similar appearance and position of the borderline hydropic gallbladder.  PANCREAS: Pancreas divisum. No ductal dilatation.  5. MRI 06/2020 and 12/2020: Morphologic features consistent with history of cirrhosis. No sequela of portal hypertension.No MR evidence of HCC.  Similar appearing probable confluent fibrosis in the right hepatic lobe.   6. HCC Surveillance: MRI 07/2022: No liver masses. Unchanged evidence of cirrhosis and confluent fibrosis   7. EGD 06/2021: No varices      Allergies   Allergen Reactions    Azathioprine Other (See Comments)     Fever  Other reaction(s): Other (See Comments)  Fever       Current Outpatient Medications   Medication Sig Dispense Refill    mycophenolate (CELLCEPT) 500 mg tablet Take 2 tablets (1,000 mg total) by mouth 2 (two) times a day with meals. 360 tablet 3    gabapentin (NEURONTIN) 100 MG capsule Take 1 capsule (100 mg total) by mouth Three (3) times a day.       No current facility-administered medications for this visit.     Social history: The patient is married.  She works as a Materials engineer. They have traveled extensively.   She does not drink any alcohol. She does not smoke cigarettes.    Family history: Negative for liver disease or liver cancer. Negative for autoimmune systemic diseases.    BP 136/76  - Pulse 69  - Temp 36.5 ??C (97.7 ??F) (Temporal)  - Ht 167.6 cm (5' 6)  - Wt 69.1 kg (152 lb 6.4 oz) Comment: with shoes - SpO2 100%  - BMI 24.60 kg/m??     Pleasant individual in NAD    HEENT: Sclera are anicteric, no temporal muscle loss  NECK: No thyromegaly or lymphadenopathy, No carotid bruits  Chest: Clear to auscultation and percussion  Heart: S1, S2, RR, No murmurs  Abdomen: Soft, non-tender, non-distended, no hepatosplenomegaly, no masses appreciated, no ascites  Skin: No spider angiomata, No rashes  Extremities: Without pedal edema, no palmar erythema. Left decubital fossa swollen, mildly tender. No erythema.  Warm extremities.   Neuro: Grossly intact, No focal deficits      LABS PENDING AT [facility]      MELD 3.0: 7 at 06/11/2022  2:20 PM  MELD-Na: 6 at 06/11/2022  2:20 PM  Calculated from:  Serum Creatinine: 0.76 mg/dL (Using min of 1 mg/dL) at 42/59/5638  7:56 PM  Serum Sodium: 140 mmol/L (Using max of 137 mmol/L) at 06/11/2022  2:20 PM  Total Bilirubin: 0.3 mg/dL (Using min of 1 mg/dL) at 43/32/9518  8:41 PM  Serum Albumin: 4.9 g/dL (Using max of 3.5 g/dL) at 66/12/3014  0:10 PM  INR(ratio): 1.0 at 06/11/2022  2:20 PM  Age at listing (hypothetical): 61 years  Sex: Female at 06/11/2022  2:20 PM        Impression:  1. Seronegative autoimmune hepatitis based on response to immunosuppressive medication. The patient presented with severe hepatitis as evidenced by hyperbilirubinemia, elevation of her INR, decreased albumin, and results from her liver biopsy. Patient was started on a therapeutic trial of prednisone for the reasons discussed above. After only 4 days of immunosuppression, her liver enzymes (ALT)  that had been regularly elevated above 1000 decreased substantially.  This was highly suggestive of a positive effect of the prednisone and was c/w  presumed ANA-negative/SMA-negative autoimmune hepatitis.Labs showed continued normalization of bilirubin and INR, although she remained with mildly elevated ALT in the 40-45 range. CellCept was added on 5/12 and increased to 1000mg  BID on 12/04/16.Steroids tapered to off as of 07/2017.    We will maintain current level of CellCept.  ALT normal.  Patient feels completely well and labs stable. In remission on CellCept.     2. Cirrhosis, well compensated:  FIBROSCAN suggestive of cirrhosis and MRI suggestive of nodular contour with focal fibrosis.  Liver biopsy during acute episode did demonstrate possible bridging fibrosis. Inflammation now resolved.    3. HCC surveillance: 07/2022: Cirrhosis and confluence fibrosis, unchanged. No liver masses.       4. Evaluate portal HTN: MRI from 6/22 showed some changes c/w increasing portal HTN despite remission of autoimmune hepatitis. Cirrhotic stigmata with evidence of increased portal pressures including recannulized umbilical vein and small caliber upper abdominal varices. EGD 06/2021: No varices. Mild esophagitis.      5. Elevated non-fasting glucose- HgbA1C 5.4 in 02/2022.    6. MRI contrast extravasation left arm: discussed with radiology- continue conservative management, arm elevation, cold compresses. Patient aware to go to ER if increased pain or swelling.     RTC in 6 months with MRI same day. Labs  at 3 months interval while on immunosuppression.  Today's labs to be drawn at [facility] at patient's convenience.    [name], M.D.  Professor of Medicine  Director, [facility]  [place] at [place]    [phone] recannulized umbilical vein and small caliber upper abdominal varices. EGD 06/2021: No varices. Mild esophagitis.      5. Elevated non-fasting glucose- HgbA1C 5.4 in 02/2022.    6. MRI contrast extravasation left arm: discussed with radiology- continue conservative management, arm elevation, cold compresses. Patient aware to go to ER if increased pain or swelling.     RTC in 6 months with MRI same day. Labs at 3 months interval while on immunosuppression.      [name], M.D.  Professor of Medicine  Director, [facility]  [place] [facility] [place] at [place]    [phone]

## 2022-08-13 NOTE — Unmapped (Signed)
I was called to the MR suite at [facility] by MRI techs.     Patient had contrast extravasation in the left antecubital fossa, with mild localized swelling, and firmness without tenderness. The distal pulses and sensations were intact and patient was in no apparent discomfort.     Discussed that this is a case of mild contrast extravasation and is expected to resolve over time.     Patient verbalized understanding and felt okay to go ahead with cannulation on the contralateral side and complete the scan.     Patient has appointment with primary provider right after the scan.

## 2022-08-13 NOTE — Unmapped (Addendum)
Autoimmune hepatitis, doing well on CellCept.  Continue this medication. Labs ordered for [facility]. Monitor arm and go to ER if worsening pain or swelling. Labs in 3 months.  Return to office in 6 months.

## 2022-08-14 NOTE — Unmapped (Signed)
Post-extravasation call. Patient denies tingling/coldness/pain of arm/hand. States it's definitely getting better. States that the swelling has decreased but is moving down her arm. Reminded her to elevate the arm; she said that she is trying as much as she can. Also reminded her to alternate with ice and heat every 20 minutes. Reminded her to go to emergency room if things are getting worse. Verbalized understanding. She appreciated the call.

## 2022-08-25 DIAGNOSIS — K754 Autoimmune hepatitis: Principal | ICD-10-CM

## 2022-08-25 MED ORDER — MYCOPHENOLATE MOFETIL 500 MG TABLET
ORAL_TABLET | Freq: Two times a day (BID) | ORAL | 3 refills | 90 days
Start: 2022-08-25 — End: 2023-08-25

## 2022-08-30 MED ORDER — MYCOPHENOLATE MOFETIL 500 MG TABLET
ORAL_TABLET | Freq: Two times a day (BID) | ORAL | 0 refills | 90 days | Status: CP
Start: 2022-08-30 — End: ?
  Filled 2022-09-07: qty 120, 30d supply, fill #0

## 2022-08-30 NOTE — Unmapped (Signed)
Refill request for Mycophenolate    LCV 08-13-22 with Dr. Foy Guadalajara    Labs ordered 08-13-22 for [facility] to be drawn at pt's convenience    Will authorize refill at this time    -Lanora Manis RN

## 2022-09-01 NOTE — Unmapped (Signed)
[facility] Specialty Pharmacy Refill Coordination Note    Margaret Berger, DOB: 1960/10/13  Phone: There are no phone numbers on file.      All above HIPAA information was verified with patient.         08/26/2022     7:27 PM   Specialty Rx Medication Refill Questionnaire   Which Medications would you like refilled and shipped? Cellcept. 15 days left.   Please list all current allergies: None   Have you missed any doses in the last 30 days? No   Have you had any changes to your medication(s) since your last refill? No   How many days remaining of each medication do you have at home? 15 days   Have you experienced any side effects in the last 30 days? No   Please enter the full address (street address, city, state, zip code) where you would like your medication(s) to be delivered to. [address], [place], [place] [address]   Please specify on which day you would like your medication(s) to arrive. Note: if you need your medication(s) within 3 days, please call the pharmacy to schedule your order at [phone]  09/05/2022   Has your insurance changed since your last refill? No   Would you like a pharmacist to call you to discuss your medication(s)? No   Do you require a signature for your package? (Note: if we are billing Medicare Part B or your order contains a controlled substance, we will require a signature) No         Completed refill call assessment today to schedule patient's medication shipment from the [facility] [phone]).  All relevant notes have been reviewed.       Confirmed patient received a Conservation officer, historic buildings and a Surveyor, mining with first shipment. The patient will receive a drug information handout for each medication shipped and additional FDA Medication Guides as required.         REFERRAL TO PHARMACIST     Referral to the pharmacist: Not needed      [facility]     Shipping address confirmed in Epic.     Delivery Scheduled: Yes, Expected medication delivery date: 09/05/22. Medication will be delivered via Next Day Courier to the prescription address in Epic WAM.    Tobi Bastos, PharmD   [facility] Specialty Pharmacist

## 2022-09-04 NOTE — Unmapped (Signed)
Margaret Berger 's Mycophenolate shipment will be delayed as a result of insurance processor down.     I have reached out to the patient  at [phone] and left a voicemail message.  We will send a text message and reschedule the medication for the delivery date that the patient agreed upon.  We have confirmed the delivery date as 02/26 or 02/27, via same day courier.

## 2022-09-11 DIAGNOSIS — K754 Autoimmune hepatitis: Secondary | ICD-10-CM | POA: Diagnosis not present

## 2022-09-12 LAB — COMPREHENSIVE METABOLIC PANEL
A/G RATIO: 2.6 — ABNORMAL HIGH (ref 1.2–2.2)
ALBUMIN: 5.1 g/dL — ABNORMAL HIGH (ref 3.9–4.9)
ALKALINE PHOSPHATASE: 51 IU/L (ref 44–121)
ALT (SGPT): 16 IU/L (ref 0–32)
AST (SGOT): 20 IU/L (ref 0–40)
BILIRUBIN TOTAL (MG/DL) IN SER/PLAS: 0.4 mg/dL (ref 0.0–1.2)
BLOOD UREA NITROGEN: 16 mg/dL (ref 8–27)
BUN / CREAT RATIO: 24 (ref 12–28)
CALCIUM: 10.2 mg/dL (ref 8.7–10.3)
CHLORIDE: 100 mmol/L (ref 96–106)
CO2: 24 mmol/L (ref 20–29)
CREATININE: 0.68 mg/dL (ref 0.57–1.00)
EGFR: 98 mL/min/{1.73_m2}
GLOBULIN, TOTAL: 2 g/dL (ref 1.5–4.5)
GLUCOSE: 126 mg/dL — ABNORMAL HIGH (ref 70–99)
POTASSIUM: 5.4 mmol/L — ABNORMAL HIGH (ref 3.5–5.2)
SODIUM: 139 mmol/L (ref 134–144)
TOTAL PROTEIN: 7.1 g/dL (ref 6.0–8.5)

## 2022-09-12 LAB — CBC W/ DIFFERENTIAL
BANDED NEUTROPHILS ABSOLUTE COUNT: 0 10*3/uL (ref 0.0–0.1)
BASOPHILS ABSOLUTE COUNT: 0 10*3/uL (ref 0.0–0.2)
BASOPHILS RELATIVE PERCENT: 1 %
EOSINOPHILS ABSOLUTE COUNT: 0.1 10*3/uL (ref 0.0–0.4)
EOSINOPHILS RELATIVE PERCENT: 2 %
HEMATOCRIT: 40.6 % (ref 34.0–46.6)
HEMOGLOBIN: 13.4 g/dL (ref 11.1–15.9)
IMMATURE GRANULOCYTES: 0 %
LYMPHOCYTES ABSOLUTE COUNT: 1.6 10*3/uL (ref 0.7–3.1)
LYMPHOCYTES RELATIVE PERCENT: 31 %
MEAN CORPUSCULAR HEMOGLOBIN CONC: 33 g/dL (ref 31.5–35.7)
MEAN CORPUSCULAR HEMOGLOBIN: 30.6 pg (ref 26.6–33.0)
MEAN CORPUSCULAR VOLUME: 93 fL (ref 79–97)
MONOCYTES ABSOLUTE COUNT: 0.4 10*3/uL (ref 0.1–0.9)
MONOCYTES RELATIVE PERCENT: 8 %
NEUTROPHILS ABSOLUTE COUNT: 2.9 10*3/uL (ref 1.4–7.0)
NEUTROPHILS RELATIVE PERCENT: 58 %
PLATELET COUNT: 174 10*3/uL (ref 150–450)
RED BLOOD CELL COUNT: 4.38 x10E6/uL (ref 3.77–5.28)
RED CELL DISTRIBUTION WIDTH: 11.7 % (ref 11.7–15.4)
WHITE BLOOD CELL COUNT: 5.1 10*3/uL (ref 3.4–10.8)

## 2022-09-12 LAB — PROTIME-INR
INR: 1 (ref 0.9–1.2)
PROTHROMBIN TIME: 10.6 s (ref 9.1–12.0)

## 2022-09-12 LAB — AFP TUMOR MARKER: AFP-TUMOR MARKER: 4.2 ng/mL (ref 0.0–9.2)

## 2022-10-03 NOTE — Unmapped (Signed)
The [facility] has made a second and final attempt to reach this patient to refill the following medication: Mycophenolate.      We have left voicemails on the following phone numbers: [phone], have sent a MyChart message, have sent a text message to the following phone numbers: [phone], and have sent a Mychart questionnaire..    Dates contacted: 3/13, 3/22  Last scheduled delivery: 2/25      The patient may be at risk of non-compliance with this medication. The patient should call the [facility] at [phone]  Option 4, then Option 2 (all other specialty patients) to refill medication.    [name]   [facility] Specialty Technician

## 2022-10-03 NOTE — Unmapped (Signed)
[facility] Specialty Pharmacy Refill Coordination Note    Specialty Medication(s) to be Shipped:   General Specialty: mycophenolate 500mg     Other medication(s) to be shipped: No additional medications requested for fill at this time     Margaret Berger, DOB: 1960/12/06  Phone: There are no phone numbers on file.      All above HIPAA information was verified with patient.     Was a Nurse, learning disability used for this call? No    Completed refill call assessment today to schedule patient's medication shipment from the [facility] [phone]).  All relevant notes have been reviewed.     Specialty medication(s) and dose(s) confirmed: Regimen is correct and unchanged.   Changes to medications: [name] reports no changes at this time.  Changes to insurance: No  New side effects reported not previously addressed with a pharmacist or physician: None reported  Questions for the pharmacist: No    Confirmed patient received a Conservation officer, historic buildings and a Surveyor, mining with first shipment. The patient will receive a drug information handout for each medication shipped and additional FDA Medication Guides as required.       DISEASE/MEDICATION-SPECIFIC INFORMATION        N/A    SPECIALTY MEDICATION ADHERENCE              Were doses missed due to medication being on hold? No    mycophenolate 500  mg: 7 days of medicine on hand       REFERRAL TO PHARMACIST     Referral to the pharmacist: Not needed      [facility]     Shipping address confirmed in Epic.     Patient was notified of new phone menu : No    Delivery Scheduled: Yes, Expected medication delivery date: 10/08/2022.     Medication will be delivered via Same Day Courier to the prescription address in Epic WAM.    [name]   [facility] Specialty Technician

## 2022-10-08 MED FILL — MYCOPHENOLATE MOFETIL 500 MG TABLET: ORAL | 30 days supply | Qty: 120 | Fill #1

## 2022-10-29 ENCOUNTER — Encounter: Payer: BC Managed Care – PPO | Admitting: Dermatology

## 2022-11-19 NOTE — Unmapped (Signed)
[facility] Pharmacy Clinical Assessment & Refill Coordination Note    Margaret Berger, DOB: Nov 07, 1960  Phone: There are no phone numbers on file.    All above HIPAA information was verified with patient.     Was a Nurse, learning disability used for this call? No    Specialty Medication(s):   Inflammatory Disorders: mycophenolate mofetil 500mg  tablets     Current Outpatient Medications   Medication Sig Dispense Refill    mycophenolate (CELLCEPT) 500 mg tablet Take 2 tablets (1,000 mg total) by mouth 2 (two) times a day with meals. 360 tablet 0     No current facility-administered medications for this visit.        Changes to medications: Marshal reports no changes at this time.    Allergies   Allergen Reactions    Azathioprine Other (See Comments)     Fever  Other reaction(s): Other (See Comments)  Fever       Changes to allergies: No    SPECIALTY MEDICATION ADHERENCE     Mycophenolate mofetil 500 mg: approximately 14 days of medicine on hand     Medication Adherence    Patient reported X missed doses in the last month: 0  Specialty Medication: Mycophenolate mofetil 500mg  tablets  Patient is on additional specialty medications: No  Any gaps in refill history greater than 2 weeks in the last 3 months: no  Demonstrates understanding of importance of adherence: yes  Informant: patient  Provider-estimated medication adherence level: good  Patient is at risk for Non-Adherence: No          Specialty medication(s) dose(s) confirmed: Regimen is correct and unchanged.     Are there any concerns with adherence? No    Adherence counseling provided? Not needed    CLINICAL MANAGEMENT AND INTERVENTION      Clinical Benefit Assessment:    Do you feel the medicine is effective or helping your condition? Yes    Clinical Benefit counseling provided? Not needed    Adverse Effects Assessment:    Are you experiencing any side effects? No    Are you experiencing difficulty administering your medicine? No    Quality of Life Assessment:    How many days over the past month did your Autoimmune Hepatitis  keep you from your normal activities? For example, brushing your teeth or getting up in the morning. 0    Have you discussed this with your provider? Not needed    Acute Infection Status:    Acute infections noted within Epic:  No active infections  Patient reported infection: None    Therapy Appropriateness:    Is therapy appropriate and patient progressing towards therapeutic goals? Yes, therapy is appropriate and should be continued    DISEASE/MEDICATION-SPECIFIC INFORMATION      N/A    Chronic Inflammatory Diseases: Have you experienced any flares in the last month? No  Has this been reported to your provider? Not applicable    PATIENT SPECIFIC NEEDS     Does the patient have any physical, cognitive, or cultural barriers? No    Is the patient high risk? Yes, patient is taking a REMS drug. Medication is dispensed in compliance with REMS program    Did the patient require a clinical intervention? No    Does the patient require physician intervention or other additional services (i.e., nutrition, smoking cessation, social work)? No    SOCIAL DETERMINANTS OF HEALTH     At the [facility], we have learned  that life circumstances - like trouble affording food, housing, utilities, or transportation can affect the health of many of our patients.   That is why we wanted to ask: are you currently experiencing any life circumstances that are negatively impacting your health and/or quality of life? Patient declined to answer    Social Determinants of Health     Financial Resource Strain: Not on file   Internet Connectivity: Not on file   Food Insecurity: Not on file   Tobacco Use: Low Risk  (08/13/2022)    Patient History     Smoking Tobacco Use: Never     Smokeless Tobacco Use: Never     Passive Exposure: Not on file   Housing/Utilities: Not on file   Alcohol Use: Not on file   Transportation Needs: Not on file   Substance Use: Not on file Health Literacy: Not on file   Physical Activity: Not on file   Interpersonal Safety: Not on file   Stress: Not on file   Intimate Partner Violence: Not on file   Depression: Not on file   Social Connections: Not on file       Would you be willing to receive help with any of the needs that you have identified today? Not applicable       SHIPPING     Specialty Medication(s) to be Shipped:   Inflammatory Disorders: mycophenolate mofetil 500mg  tablets    Other medication(s) to be shipped: No additional medications requested for fill at this time     Changes to insurance: No    Delivery Scheduled: Yes, Expected medication delivery date: 11/21/22.     Medication will be delivered via Same Day Courier to the confirmed prescription address in [facility].    The patient will receive a drug information handout for each medication shipped and additional FDA Medication Guides as required.  Verified that patient has previously received a Conservation officer, historic buildings and a Surveyor, mining.    The patient or caregiver noted above participated in the development of this care plan and knows that they can request review of or adjustments to the care plan at any time.      All of the patient's questions and concerns have been addressed.    Roderic Palau, PharmD   [facility] Specialty Pharmacist

## 2022-11-21 MED FILL — MYCOPHENOLATE MOFETIL 500 MG TABLET: ORAL | 30 days supply | Qty: 120 | Fill #2

## 2022-12-10 NOTE — Unmapped (Signed)
Labs ordered at [facility]

## 2022-12-12 DIAGNOSIS — K754 Autoimmune hepatitis: Principal | ICD-10-CM

## 2022-12-12 MED ORDER — MYCOPHENOLATE MOFETIL 500 MG TABLET
ORAL_TABLET | Freq: Two times a day (BID) | ORAL | 0 refills | 90 days
Start: 2022-12-12 — End: ?

## 2022-12-12 NOTE — Unmapped (Signed)
[facility] Specialty Pharmacy Refill Coordination Note    Margaret Berger, DOB: 1960/08/02  Phone: There are no phone numbers on file.      All above HIPAA information was verified with patient.         12/12/2022     7:57 AM   Specialty Rx Medication Refill Questionnaire   Which Medications would you like refilled and shipped? CellCept   Please list all current allergies: None   Have you missed any doses in the last 30 days? No   Have you had any changes to your medication(s) since your last refill? No   How many days remaining of each medication do you have at home? 10   Have you experienced any side effects in the last 30 days? No   Please enter the full address (street address, city, state, zip code) where you would like your medication(s) to be delivered to. [address], [place], [place] [address]   Please specify on which day you would like your medication(s) to arrive. Note: if you need your medication(s) within 3 days, please call the pharmacy to schedule your order at [phone]  12/19/2022   Has your insurance changed since your last refill? No   Would you like a pharmacist to call you to discuss your medication(s)? No   Do you require a signature for your package? (Note: if we are billing Medicare Part B or your order contains a controlled substance, we will require a signature) No         Completed refill call assessment today to schedule patient's medication shipment from the [facility] [phone]).  All relevant notes have been reviewed.       Confirmed patient received a Conservation officer, historic buildings and a Surveyor, mining with first shipment. The patient will receive a drug information handout for each medication shipped and additional FDA Medication Guides as required.         REFERRAL TO PHARMACIST     Referral to the pharmacist: Not needed      [facility]     Shipping address confirmed in Epic.     Delivery Scheduled: Yes, Expected medication delivery date: 12/19/2022.  However, Rx request for refills was sent to the provider as there are none remaining.     Medication will be delivered via Next Day Courier to the prescription address in Epic WAM.    [name]   [facility] Specialty Technician

## 2022-12-15 DIAGNOSIS — K754 Autoimmune hepatitis: Secondary | ICD-10-CM | POA: Diagnosis not present

## 2022-12-16 LAB — COMPREHENSIVE METABOLIC PANEL
A/G RATIO: 2 (ref 1.2–2.2)
ALBUMIN: 4.7 g/dL (ref 3.9–4.9)
ALKALINE PHOSPHATASE: 55 IU/L (ref 44–121)
ALT (SGPT): 18 IU/L (ref 0–32)
AST (SGOT): 20 IU/L (ref 0–40)
BILIRUBIN TOTAL (MG/DL) IN SER/PLAS: 0.4 mg/dL (ref 0.0–1.2)
BLOOD UREA NITROGEN: 16 mg/dL (ref 8–27)
BUN / CREAT RATIO: 21 (ref 12–28)
CALCIUM: 9.8 mg/dL (ref 8.7–10.3)
CHLORIDE: 102 mmol/L (ref 96–106)
CO2: 23 mmol/L (ref 20–29)
CREATININE: 0.77 mg/dL (ref 0.57–1.00)
EGFR: 86 mL/min/{1.73_m2}
GLOBULIN, TOTAL: 2.3 g/dL (ref 1.5–4.5)
GLUCOSE: 90 mg/dL (ref 70–99)
POTASSIUM: 4.6 mmol/L (ref 3.5–5.2)
SODIUM: 139 mmol/L (ref 134–144)
TOTAL PROTEIN: 7 g/dL (ref 6.0–8.5)

## 2022-12-16 LAB — AFP TUMOR MARKER: AFP-TUMOR MARKER: 3.9 ng/mL (ref 0.0–9.2)

## 2022-12-16 LAB — CBC W/ DIFFERENTIAL
BANDED NEUTROPHILS ABSOLUTE COUNT: 0 10*3/uL (ref 0.0–0.1)
BASOPHILS ABSOLUTE COUNT: 0 10*3/uL (ref 0.0–0.2)
BASOPHILS RELATIVE PERCENT: 0 %
EOSINOPHILS ABSOLUTE COUNT: 0.1 10*3/uL (ref 0.0–0.4)
EOSINOPHILS RELATIVE PERCENT: 2 %
HEMATOCRIT: 39.4 % (ref 34.0–46.6)
HEMOGLOBIN: 13.2 g/dL (ref 11.1–15.9)
IMMATURE GRANULOCYTES: 0 %
LYMPHOCYTES ABSOLUTE COUNT: 2.2 10*3/uL (ref 0.7–3.1)
LYMPHOCYTES RELATIVE PERCENT: 30 %
MEAN CORPUSCULAR HEMOGLOBIN CONC: 33.5 g/dL (ref 31.5–35.7)
MEAN CORPUSCULAR HEMOGLOBIN: 31.4 pg (ref 26.6–33.0)
MEAN CORPUSCULAR VOLUME: 94 fL (ref 79–97)
MONOCYTES ABSOLUTE COUNT: 0.5 10*3/uL (ref 0.1–0.9)
MONOCYTES RELATIVE PERCENT: 7 %
NEUTROPHILS ABSOLUTE COUNT: 4.3 10*3/uL (ref 1.4–7.0)
NEUTROPHILS RELATIVE PERCENT: 61 %
PLATELET COUNT: 195 10*3/uL (ref 150–450)
RED BLOOD CELL COUNT: 4.21 x10E6/uL (ref 3.77–5.28)
RED CELL DISTRIBUTION WIDTH: 12.5 % (ref 11.7–15.4)
WHITE BLOOD CELL COUNT: 7.2 10*3/uL (ref 3.4–10.8)

## 2022-12-16 LAB — PROTIME-INR
INR: 1 (ref 0.9–1.2)
PROTHROMBIN TIME: 10.8 s (ref 9.1–12.0)

## 2022-12-19 MED ORDER — MYCOPHENOLATE MOFETIL 500 MG TABLET
ORAL_TABLET | Freq: Two times a day (BID) | ORAL | 1 refills | 90 days | Status: CP
Start: 2022-12-19 — End: ?
  Filled 2022-12-19: qty 120, 30d supply, fill #0

## 2022-12-19 NOTE — Telephone Encounter (Signed)
Reviewed. MAs please confirm that patient is scheduled for FBSE and mark off in the biopsy book. Thank you! 

## 2022-12-19 NOTE — Unmapped (Deleted)
Margaret Berger 's Mycophenolate shipment will be delayed as a result of no refills remain on the prescription.      I have reached out to the patient  at [phone] and left a voicemail message.  We will call the patient back to reschedule the delivery upon resolution. We have not confirmed the new delivery date.

## 2022-12-19 NOTE — Unmapped (Signed)
Refill request for Mycophenolate    Last clinic visit 08/13/22    Labs done 12/15/22    Will authorize refill at this time

## 2022-12-19 NOTE — Unmapped (Signed)
VM from patient this morning that she needs her Mycophenolate refilled & that she is going [place] on [place]    Chart reviewed--> last clinic visit was 07/16/22 & labs were done 12/15/22    Refill submitted to [facility]    I called [facility] to make sure that the medication goes out today (Friday) given that she is going out of the country on Monday    Spoke with Moshe Salisbury in the [employer].  She said that I needed to speak with Samantha-pharmacist in Home Delivery.  I was connected to [facility] who stated that there is a tech currently processing the medication & that it will be delivered tomorrow to the pt's home in [place].    I called the patient and left her a message with this information.    I also left my direct phone number in case the patient needs anything else.    Lanora Manis [employer]

## 2023-01-08 NOTE — Unmapped (Signed)
[facility] Specialty Pharmacy Refill Coordination Note    Margaret Berger, DOB: [birth year]  Phone: There are no phone numbers on file.      All above HIPAA information was verified with patient.         01/08/2023     9:08 AM   Specialty Rx Medication Refill Questionnaire   Which Medications would you like refilled and shipped? Mycophenolate - 12 days left   Please list all current allergies: None   Have you missed any doses in the last 30 days? No   Have you had any changes to your medication(s) since your last refill? No   How many days remaining of each medication do you have at home? 12   Have you experienced any side effects in the last 30 days? No   Please enter the full address (street address, city, state, zip code) where you would like your medication(s) to be delivered to. [address], [address], [place], [place] [address]   Please specify on which day you would like your medication(s) to arrive. Note: if you need your medication(s) within 3 days, please call the pharmacy to schedule your order at [phone]  01/19/2023   Has your insurance changed since your last refill? No   Would you like a pharmacist to call you to discuss your medication(s)? No   Do you require a signature for your package? (Note: if we are billing Medicare Part B or your order contains a controlled substance, we will require a signature) No         Completed refill call assessment today to schedule patient's medication shipment from the [facility] [phone]).  All relevant notes have been reviewed.       Confirmed patient received a Conservation officer, historic buildings and a Surveyor, mining with first shipment. The patient will receive a drug information handout for each medication shipped and additional FDA Medication Guides as required.         REFERRAL TO PHARMACIST     Referral to the pharmacist: Not needed      [facility]     Shipping address confirmed in Epic.     Delivery Scheduled: Yes, Expected medication delivery date: 01/19/2023.     Medication will be delivered via UPS to the temporary address in Epic WAM.    [name]   [facility] Specialty Technician

## 2023-01-11 LAB — HEPATIC FUNCTION PANEL
ALT: 18 U/L (ref 7–35)
AST: 20 (ref 13–35)
Alkaline Phosphatase: 55 (ref 25–125)
Bilirubin, Total: 0.4

## 2023-01-11 LAB — CBC AND DIFFERENTIAL
HCT: 39 (ref 36–46)
Hemoglobin: 13.2 (ref 12.0–16.0)
Platelets: 195 10*3/uL (ref 150–400)
WBC: 7.2

## 2023-01-11 LAB — COMPREHENSIVE METABOLIC PANEL
Albumin: 4.7 (ref 3.5–5.0)
Calcium: 9.8 (ref 8.7–10.7)
eGFR: 86

## 2023-01-11 LAB — BASIC METABOLIC PANEL
BUN: 16 (ref 4–21)
CO2: 23 — AB (ref 13–22)
Chloride: 102 (ref 99–108)
Creatinine: 0.8 (ref 0.5–1.1)
Glucose: 90
Potassium: 4.6 meq/L (ref 3.5–5.1)
Sodium: 139 (ref 137–147)

## 2023-01-11 LAB — CBC: RBC: 4.21 (ref 3.87–5.11)

## 2023-01-12 ENCOUNTER — Encounter: Payer: BC Managed Care – PPO | Admitting: Internal Medicine

## 2023-01-16 MED FILL — MYCOPHENOLATE MOFETIL 500 MG TABLET: ORAL | 30 days supply | Qty: 120 | Fill #1

## 2023-01-28 ENCOUNTER — Ambulatory Visit
Admit: 2023-01-28 | Discharge: 2023-01-29 | Payer: PRIVATE HEALTH INSURANCE | Attending: Gastroenterology | Primary: Gastroenterology

## 2023-01-28 DIAGNOSIS — K754 Autoimmune hepatitis: Secondary | ICD-10-CM | POA: Diagnosis not present

## 2023-01-28 NOTE — Unmapped (Signed)
Autoimmune hepatitis well controlled on Cellcept. Continue this medication. Recent labs looked great. Repeat labs in 3 months at [facility], ordered. MRI pending for next week. Repeat MRI around time of next visit in 6 months.

## 2023-02-03 ENCOUNTER — Ambulatory Visit (INDEPENDENT_AMBULATORY_CARE_PROVIDER_SITE_OTHER): Payer: BC Managed Care – PPO | Admitting: Dermatology

## 2023-02-03 VITALS — BP 134/76 | HR 69

## 2023-02-03 DIAGNOSIS — L821 Other seborrheic keratosis: Secondary | ICD-10-CM

## 2023-02-03 DIAGNOSIS — L814 Other melanin hyperpigmentation: Secondary | ICD-10-CM | POA: Diagnosis not present

## 2023-02-03 DIAGNOSIS — Z1283 Encounter for screening for malignant neoplasm of skin: Secondary | ICD-10-CM

## 2023-02-03 DIAGNOSIS — W908XXA Exposure to other nonionizing radiation, initial encounter: Secondary | ICD-10-CM

## 2023-02-03 DIAGNOSIS — D229 Melanocytic nevi, unspecified: Secondary | ICD-10-CM

## 2023-02-03 DIAGNOSIS — D1801 Hemangioma of skin and subcutaneous tissue: Secondary | ICD-10-CM | POA: Diagnosis not present

## 2023-02-03 DIAGNOSIS — Z85828 Personal history of other malignant neoplasm of skin: Secondary | ICD-10-CM

## 2023-02-03 DIAGNOSIS — Z872 Personal history of diseases of the skin and subcutaneous tissue: Secondary | ICD-10-CM

## 2023-02-03 DIAGNOSIS — L578 Other skin changes due to chronic exposure to nonionizing radiation: Secondary | ICD-10-CM

## 2023-02-03 DIAGNOSIS — L918 Other hypertrophic disorders of the skin: Secondary | ICD-10-CM

## 2023-02-03 NOTE — Patient Instructions (Addendum)
Recommend daily broad spectrum sunscreen SPF 30+ to sun-exposed areas, reapply every 2 hours as needed. Call for new or changing lesions.  Staying in the shade or wearing long sleeves, sun glasses (UVA+UVB protection) and wide brim hats (4-inch brim around the entire circumference of the hat) are also recommended for sun protection.    Due to recent changes in healthcare laws, you may see results of your pathology and/or laboratory studies on MyChart before the doctors have had a chance to review them. We understand that in some cases there may be results that are confusing or concerning to you. Please understand that not all results are received at the same time and often the doctors may need to interpret multiple results in order to provide you with the best plan of care or course of treatment. Therefore, we ask that you please give us 2 business days to thoroughly review all your results before contacting the office for clarification. Should we see a critical lab result, you will be contacted sooner.   If You Need Anything After Your Visit  If you have any questions or concerns for your doctor, please call our main line at [phone] and press option 4 to reach your doctor's medical assistant. If no one answers, please leave a voicemail as directed and we will return your call as soon as possible. Messages left after 4 pm will be answered the following business day.   You may also send us a message via MyChart. We typically respond to MyChart messages within 1-2 business days.  For prescription refills, please ask your pharmacy to contact our office. Our fax number is [phone].  If you have an urgent issue when the clinic is closed that cannot wait until the next business day, you can page your doctor at the number below.    Please note that while we do our best to be available for urgent issues outside of office hours, we are not available 24/7.   If you have an urgent issue and are  unable to reach us, you may choose to seek medical care at your doctor's office, retail clinic, urgent care center, or emergency room.  If you have a medical emergency, please immediately call 911 or go to the emergency department.  Pager Numbers  - Dr. Kowalski: [phone]  - Dr. Moye: [phone]  - Dr. Stewart: [phone]  In the event of inclement weather, please call our main line at [phone] for an update on the status of any delays or closures.  Dermatology Medication Tips: Please keep the boxes that topical medications come in in order to help keep track of the instructions about where and how to use these. Pharmacies typically print the medication instructions only on the boxes and not directly on the medication tubes.   If your medication is too expensive, please contact our office at [phone] option 4 or send us a message through MyChart.   We are unable to tell what your co-pay for medications will be in advance as this is different depending on your insurance coverage. However, we may be able to find a substitute medication at lower cost or fill out paperwork to get insurance to cover a needed medication.   If a prior authorization is required to get your medication covered by your insurance company, please allow us 1-2 business days to complete this process.  Drug prices often vary depending on where the prescription is filled and some pharmacies may offer cheaper prices.  The website www.goodrx.com   contains coupons for medications through different pharmacies. The prices here do not account for what the cost may be with help from insurance (it may be cheaper with your insurance), but the website can give you the price if you did not use any insurance.  - You can print the associated coupon and take it with your prescription to the pharmacy.  - You may also stop by our office during regular business hours and pick up a GoodRx coupon card.  - If you need your  prescription sent electronically to a different pharmacy, notify our office through [facility] MyChart or by phone at [phone] option 4.     Si Usted Necesita Algo Despus de Su Visita  Tambin puede enviarnos un mensaje a travs de MyChart. Por lo general respondemos a los mensajes de MyChart en el transcurso de 1 a 2 das hbiles.  Para renovar recetas, por favor pida a su farmacia que se ponga en contacto con nuestra oficina. Nuestro nmero de fax es el [phone].  Si tiene un asunto urgente cuando la clnica est cerrada y que no puede esperar hasta el siguiente da hbil, puede llamar/localizar a su doctor(a) al nmero que aparece a continuacin.   Por favor, tenga en cuenta que aunque hacemos todo lo posible para estar disponibles para asuntos urgentes fuera del horario de oficina, no estamos disponibles las 24 horas del da, los 7 das de la semana.   Si tiene un problema urgente y no puede comunicarse con nosotros, puede optar por buscar atencin mdica  en el consultorio de su doctor(a), en una clnica privada, en un centro de atencin urgente o en una sala de emergencias.  Si tiene una emergencia mdica, por favor llame inmediatamente al 911 o vaya a la sala de emergencias.  Nmeros de bper  - Dr. Kowalski: [phone]  - Dra. Moye: [phone]  - Dra. Stewart: [phone]  En caso de inclemencias del tiempo, por favor llame a nuestra lnea principal al [phone] para una actualizacin sobre el estado de cualquier retraso o cierre.  Consejos para la medicacin en dermatologa: Por favor, guarde las cajas en las que vienen los medicamentos de uso tpico para ayudarle a seguir las instrucciones sobre dnde y cmo usarlos. Las farmacias generalmente imprimen las instrucciones del medicamento slo en las cajas y no directamente en los tubos del medicamento.   Si su medicamento es muy caro, por favor, pngase en contacto con nuestra oficina llamando al [phone]  y presione la opcin 4 o envenos un mensaje a travs de MyChart.   No podemos decirle cul ser su copago por los medicamentos por adelantado ya que esto es diferente dependiendo de la cobertura de su seguro. Sin embargo, es posible que podamos encontrar un medicamento sustituto a menor costo o llenar un formulario para que el seguro cubra el medicamento que se considera necesario.   Si se requiere una autorizacin previa para que su compaa de seguros cubra su medicamento, por favor permtanos de 1 a 2 das hbiles para completar este proceso.  Los precios de los medicamentos varan con frecuencia dependiendo del lugar de dnde se surte la receta y alguna farmacias pueden ofrecer precios ms baratos.  El sitio web www.goodrx.com tiene cupones para medicamentos de diferentes farmacias. Los precios aqu no tienen en cuenta lo que podra costar con la ayuda del seguro (puede ser ms barato con su seguro), pero el sitio web puede darle el precio si no utiliz ningn seguro.  - Puede imprimir el   cupn correspondiente y llevarlo con su receta a la farmacia.  - Tambin puede pasar por nuestra oficina durante el horario de atencin regular y recoger una tarjeta de cupones de GoodRx.  - Si necesita que su receta se enve electrnicamente a una farmacia diferente, informe a nuestra oficina a travs de MyChart de [facility] o por telfono llamando al [phone] y presione la opcin 4.  

## 2023-02-03 NOTE — Progress Notes (Signed)
Follow-Up Visit   Subjective  Jessica Zuniga is a 62 y.o. female who presents for the following: Skin Cancer Screening and Full Body Skin Exam  The patient presents for Total-Body Skin Exam (TBSE) for skin cancer screening and mole check. The patient has spots, moles and lesions to be evaluated, some may be new or changing. She has a history of BCC of the left upper arm, Mohs 06/09/22. She also has a history of AKs.     The following portions of the chart were reviewed this encounter and updated as appropriate: medications, allergies, medical history  Review of Systems:  No other skin or systemic complaints except as noted in HPI or Assessment and Plan.  Objective  Well appearing patient in no apparent distress; mood and affect are within normal limits.  A full examination was performed including scalp, head, eyes, ears, nose, lips, neck, chest, axillae, abdomen, back, buttocks, bilateral upper extremities, bilateral lower extremities, hands, feet, fingers, toes, fingernails, and toenails. All findings within normal limits unless otherwise noted below.   Relevant physical exam findings are noted in the Assessment and Plan.    Assessment & Plan   SKIN CANCER SCREENING PERFORMED TODAY.  ACTINIC DAMAGE - Chronic condition, secondary to cumulative UV/sun exposure - diffuse scaly erythematous macules with underlying dyspigmentation - Recommend daily broad spectrum sunscreen SPF 30+ to sun-exposed areas, reapply every 2 hours as needed.  - Staying in the shade or wearing long sleeves, sun glasses (UVA+UVB protection) and wide brim hats (4-inch brim around the entire circumference of the hat) are also recommended for sun protection.  - Call for new or changing lesions.  LENTIGINES, SEBORRHEIC KERATOSES, HEMANGIOMAS - Benign normal skin lesions - Benign-appearing - Call for any changes  MELANOCYTIC NEVI - Tan-brown and/or pink-flesh-colored symmetric macules and papules - Benign  appearing on exam today - Observation - Call clinic for new or changing moles - Recommend daily use of broad spectrum spf 30+ sunscreen to sun-exposed areas.   HISTORY OF BASAL CELL CARCINOMA OF THE SKIN Left upper arm, Mohs 06/09/2022 - No evidence of recurrence today - Recommend regular full body skin exams - Recommend daily broad spectrum sunscreen SPF 30+ to sun-exposed areas, reapply every 2 hours as needed.  - Call if any new or changing lesions are noted between office visits  Acrochordons (Skin Tags) - Fleshy, skin-colored pedunculated papules - Benign appearing.  - Observe. - If desired, they can be removed with an in office procedure that is not covered by insurance. - Please call the clinic if you notice any new or changing lesions.  LENTIGO Exam: 3.0 mm light tan macule on the right ear helix Due to sun exposure Treatment Plan: Benign-appearing, observe. Recommend daily broad spectrum sunscreen SPF 30+ to sun-exposed areas, reapply every 2 hours as needed.  Call for any changes  HISTORY OF PRECANCEROUS ACTINIC KERATOSIS - site(s) of PreCancerous Actinic Keratosis clear today. - these may recur and new lesions may form requiring treatment to prevent transformation into skin cancer - observe for new or changing spots and contact [facility] for appointment if occur - photoprotection with sun protective clothing; sunglasses and broad spectrum sunscreen with SPF of at least 30 + and frequent self skin exams recommended - yearly exams by a dermatologist recommended for persons with history of PreCancerous Actinic Keratoses  Return in about 1 year (around 02/03/2024) for TBSE, Hx BCC.  ICherlyn Labella, CMA, am acting as scribe for Willeen Niece, MD .  Documentation: I have reviewed the above documentation for accuracy and completeness, and I agree with the above.  Willeen Niece, MD

## 2023-02-04 ENCOUNTER — Ambulatory Visit
Admit: 2023-02-04 | Discharge: 2023-02-04 | Payer: PRIVATE HEALTH INSURANCE | Attending: Gastroenterology | Primary: Gastroenterology

## 2023-02-04 ENCOUNTER — Ambulatory Visit: Admit: 2023-02-04 | Discharge: 2023-02-04 | Payer: PRIVATE HEALTH INSURANCE

## 2023-02-04 DIAGNOSIS — K766 Portal hypertension: Secondary | ICD-10-CM | POA: Diagnosis not present

## 2023-02-04 DIAGNOSIS — C220 Liver cell carcinoma: Secondary | ICD-10-CM | POA: Diagnosis not present

## 2023-02-04 DIAGNOSIS — K754 Autoimmune hepatitis: Secondary | ICD-10-CM | POA: Diagnosis not present

## 2023-02-04 DIAGNOSIS — R918 Other nonspecific abnormal finding of lung field: Secondary | ICD-10-CM | POA: Diagnosis not present

## 2023-02-04 MED ADMIN — gadobenate dimeglumine (MULTIHANCE) 529 mg/mL (0.1mmol/0.2mL) solution 7 mL: 7 mL | INTRAVENOUS | @ 14:00:00 | Stop: 2023-02-04 | NDC 00270526417

## 2023-02-16 NOTE — Unmapped (Signed)
[facility] Refill Coordination Note    Margaret Berger, DOB: 1960/07/28  Phone: There are no phone numbers on file.      All above HIPAA information was verified with patient.         02/15/2023     2:35 PM   Specialty Rx Medication Refill Questionnaire   Which Medications would you like refilled and shipped? Cellcept 10 days left   Please list all current allergies: Azathiaprin   Have you missed any doses in the last 30 days? No   Have you had any changes to your medication(s) since your last refill? No   How many days remaining of each medication do you have at home? 10   Have you experienced any side effects in the last 30 days? No   Please enter the full address (street address, city, state, zip code) where you would like your medication(s) to be delivered to. [address], [address], [place], [place] [address]   Please specify on which day you would like your medication(s) to arrive. Note: if you need your medication(s) within 3 days, please call the pharmacy to schedule your order at [phone]  02/23/2023   Has your insurance changed since your last refill? No   Would you like a pharmacist to call you to discuss your medication(s)? No   Do you require a signature for your package? (Note: if we are billing Medicare Part B or your order contains a controlled substance, we will require a signature) No         Completed refill call assessment today to schedule patient's medication shipment from the [facility] [phone]).  All relevant notes have been reviewed.       Confirmed patient received a Conservation officer, historic buildings and a Surveyor, mining with first shipment. The patient will receive a drug information handout for each medication shipped and additional FDA Medication Guides as required.         REFERRAL TO PHARMACIST     Referral to the pharmacist: Not needed      [facility]     Shipping address confirmed in Epic.     Delivery Scheduled: Yes, Expected medication delivery date: 02/23/23 .     Medication will be delivered via UPS to the temporary address in Epic WAM.    Margaret Berger   [facility] Specialty Technician

## 2023-02-16 NOTE — Unmapped (Signed)
[facility]    Margaret Berger, M.D.  Professor of Medicine  Director, [facility]  [place] [facility] [place] [facility] [place]    [phone]    Margaret Berger    Referring MD: Midge Minium, MD      Chief complaint: Office follow-up for elevation of liver enzymes. Hospitalization (08/2016) for acute hepatitis of uncertain etiology, presumed autoimmune hepatitis treated with prednisone and mycophenylate. DRUG FEVER ASSOCIATED WITH AZATHIOPRINE. STEROIDS STOPPED 07/2017. CONTINUES ON CELLCEPT 1 GRAM BID FOR IMMUNOSUPPRESSION    Interval history: Patient continues to feel great. Traveling between [facility] and [place]. She denies fever, cough, sob, N/V, abdominal pain, chest pain, or skin rash.  She has been maintained on mycophenylate 1gram bid. No new health related concerns. MRI today with contrast extravasation in left arm.        Present illness at presentation:  Patient is a 62 y.o. Caucasian female who was hospitalized at [facility] between approximately February 12 -  August 29, 2016 with acute hepatitis. The patient had about 10 weeks of abnormal liver tests documented by her primary physician and her primary gastroenterologist. This was preceded by a short flu-like illness characterized by fatigue, muscle aches, and low-grade fever. All of the symptoms have completely remained resolved since her  Visit 09/10/2016. However she was left with elevated liver tests and had an extensive workup as an outpatient and subsequently as an inpatient at [facility] to further define the etiology of her liver disease.  Additional details of her history and prior outpatient evaluation as well as her inpatient evaluation are available from our initial consultation note and follow-up notes while she was hospitalized.       After review of the liver biopsy and all serologic data thus far which has been unrevealing, it was felt that a therapeutic trial of prednisone for presumed seronegative autoimmune hepatitis was warranted. Prednisone  40 mg of daily started on 08/29/16. She had a good biochemical response with marked improvement in ALT, INR, including a slow but steady decrease in Total/direct bilirubin.     Patient had an extensive serological evaluation which was not revealing:  1. Antinuclear antibody and anti-smooth muscle antibody were negative. Immunoglobulin G was not elevated (1061)  2. Viral serologies including acute hepatitis A, hepatitis B, hepatitis C, HCV RNA, EBV PCR, CMV PCR, HHV-6 were all negative.  3. Liver ultrasound demonstrated patent vasculature with a nodular-appearing heterogeneous liver.  4. Percutaneous liver biopsy 08/2016 at [facility]:    A: Liver, core biopsy   - Severe active hepatitis with confluent areas of periportal hepatocellular necrosis (approximately 10-20% of sampled parenchyma) (see comment)   - Trichrome stain demonstrates at least periportal fibrosis and is suspicious for bridging fibrosis     Potential etiologies to consider include, but are not limited to, infection, drug/toxin-induced liver injury, seronegative autoimmune hepatitis, and metabolic disorders. The trichrome stain findings suggest that this process is at least subacute in duration.      Liver biopsy slides were personally reviewed by Dr. Sharon Mt  with the pathologist.  No viral inclusions were noted and only few plasma cells were visualized in the liver biopsy which was an adequate specimen. No cytopathic effect. EBV immunostains were negative. Fe in hepatocytes only grade 1/4    5.  Evaluation for Zoster: Dr. Foy Guadalajara received phone call from patient's primary GI MD that patient's Zoster IgM from 09/05/2016 was positive with titer 1.17 approximately one week after starting prednisone. Review of her  record though revealed this test to have been negative on 08/29/2016.  Dr. Sharon Mt discussed these findings with Dr. Lytle Butte and the decision was made to proceed with patient starting Valtrex 500 mg bid.  She was seen by Dr. 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   Recommendations from ID: Continue valacyclovir 500 mg bid.  Advised readdressing stopping Valtrex if her repeat IgM is negative or once she is on <15 mg prednisone daily.  Valtrex was discontinued last week.  Bactrim prophylaxis while prednisone >15 mg q 24. 1 DS tablet M/W/F. The following vaccines were recommended for her to obtain under the care of her PCP: Twinrix, PCV13, and PPSV23. She should have these performed by her PCP when prednisone dose is  <20 mg daily.     Immunosuppressive history:  Patient was started on azathioprine for steroid sparing benefits in early April.  After 2 weeks patient developed fevers. ID workup negative during hospitalization.  Azathioprine was held during that hospitalization and fevers resolved. When she restarted azathioprine as outpatient, immediately had high fevers and shaking chills.  This rechallenge was highly suggestive of drug fever from azathioprine.    On 5/12 patient was started on CellCept 500mg  bid and continued on prednisone 15 mg daily.  On 12/03/16, liver enzymes remained elevated. Cellcept was increased to 1000mg  BID on 12/04/16. Prednisone tapered to off as of 07/2017.     10 sys ROS otherwise negative.     Past medical history:  Patient has generally been healthy until the current hepatitis began in December 2017.  1. No history of diabetes, coronary artery disease, hypertension, or lung disease.  2. No history of other autoimmune systemic diseases  3. Excision of incidental benign adnexal cyst  3. FIBROSCAN 11/05/16: 17.7 kps c/w stage F4 (Patient ate breakfast 3 hours prior).  4. MRI 10/2017: HEPATOBILIARY: Nodular liver contour compatible with history of cirrhosis and similar to prior. Again seen is a geographic region of T1 hypointensity and corresponding slight T2 hyperintensity in the posterior right hepatic lobe which is overall similar in extent when compared to prior imaging. This region demonstrates progressive postcontrast enhancement as before. Overall similar degree of capsular retraction most pronounced along the right anterior margin of the liver (13:29). No discrete early arterially enhancing or washout liver lesion identified. No biliary ductal dilatation. Similar appearance and position of the borderline hydropic gallbladder.  PANCREAS: Pancreas divisum. No ductal dilatation.  5. MRI 06/2020 and 12/2020: Morphologic features consistent with history of cirrhosis. No sequela of portal hypertension.No MR evidence of HCC.  Similar appearing probable confluent fibrosis in the right hepatic lobe.   6. HCC Surveillance: MRI 01/2023: No liver masses. Unchanged evidence of cirrhosis and confluent fibrosis with evidence of portal HTN  7. EGD 06/2021: No varices      Allergies   Allergen Reactions    Azathioprine Other (See Comments)     Fever  Other reaction(s): Other (See Comments)  Fever       Current Outpatient Medications   Medication Sig Dispense Refill    mycophenolate (CELLCEPT) 500 mg tablet Take 2 tablets (1,000 mg total) by mouth 2 (two) times a day with meals. 360 tablet 1     No current facility-administered medications for this visit.     Social history: The patient is married. She works as a Materials engineer. They have traveled extensively.   She does not drink any alcohol. She  does not smoke cigarettes.    Family history: Negative for liver disease or liver cancer. Negative for autoimmune systemic diseases.    BP 135/80  - Pulse 67  - Temp 36.3 ??C (97.3 ??F) (Temporal)  - Ht 167.6 cm (5' 6)  - Wt 67.5 kg (148 lb 12.8 oz)  - SpO2 100%  - BMI 24.02 kg/m??     Pleasant individual in NAD    HEENT: Sclera are anicteric, no temporal muscle loss  NECK: No thyromegaly or lymphadenopathy, No carotid bruits  Chest: Clear to auscultation and percussion  Heart: S1, S2, RR, No murmurs  Abdomen: Soft, non-tender, non-distended, no hepatosplenomegaly, no masses appreciated, no ascites  Skin: No spider angiomata, No rashes  Extremities: Without pedal edema, no palmar erythema. Left decubital fossa swollen, mildly tender. No erythema.  Warm extremities.   Neuro: Grossly intact, No focal deficits       Latest Reference Range & Units 12/15/22 16:03   WBC 3.4 - 10.8 x10E3/uL 7.2   RBC 3.77 - 5.28 x10E6/uL 4.21   HGB 11.1 - 15.9 g/dL 62.1   HCT 30.8 - 65.7 % 39.4   MCV 79 - 97 fL 94   MCH 26.6 - 33.0 pg 31.4   MCHC 31.5 - 35.7 g/dL 84.6   RDW 96.2 - 95.2 % 12.5   Platelet 150 - 450 x10E3/uL 195   Neutrophils % Not Estab. % 61   Lymphocytes % Not Estab. % 30   Monocytes % Not Estab. % 7   Eosinophils % Not Estab. % 2   Basophils % Not Estab. % 0   Absolute Neutrophils 1.4 - 7.0 x10E3/uL 4.3   Absolute Lymphocytes 0.7 - 3.1 x10E3/uL 2.2   Absolute Monocytes  0.1 - 0.9 x10E3/uL 0.5   Absolute Eosinophils 0.0 - 0.4 x10E3/uL 0.1   Absolute Basophils  0.0 - 0.2 x10E3/uL 0.0   Bands Absolute 0.0 - 0.1 x10E3/uL 0.0   Immature Granulocytes Not Estab. % 0   Sodium 134 - 144 mmol/L 139   Potassium 3.5 - 5.2 mmol/L 4.6   Chloride 96 - 106 mmol/L 102   CO2 20 - 29 mmol/L 23   Bun 8 - 27 mg/dL 16   Creatinine 8.41 - 1.00 mg/dL 3.24   BUN/Creatinine Ratio 12 - 28  21   Glucose 70 - 99 mg/dL 90   Calcium 8.7 - 40.1 mg/dL 9.8   Albumin 3.9 - 4.9 g/dL 4.7   Total Protein 6.0 - 8.5 g/dL 7.0   Globulin, Total 1.5 - 4.5 g/dL 2.3   A/G Ratio 1.2 - 2.2  2.0   Total Bilirubin 0.0 - 1.2 mg/dL 0.4   SGOT (AST) 0 - 40 IU/L 20   ALT 0 - 32 IU/L 18   Alkaline Phosphatase 44 - 121 IU/L 55         MELD 3.0: 7 at 12/15/2022  4:03 PM  MELD-Na: 6 at 12/15/2022  4:03 PM  Calculated from:  Serum Creatinine: 0.77 mg/dL (Using min of 1 mg/dL) at 0/08/7251  6:64 PM  Serum Sodium: 139 mmol/L (Using max of 137 mmol/L) at 12/15/2022  4:03 PM  Total Bilirubin: 0.4 mg/dL (Using min of 1 mg/dL) at 4/0/3474  2:59 PM  Serum Albumin: 4.7 g/dL (Using max of 3.5 g/dL) at 11/16/3873  6:43 PM  INR(ratio): 1.0 at 12/15/2022  4:03 PM  Age at listing (hypothetical): 62 years  Sex: Female at 12/15/2022  4:03 PM  Impression:  1. Seronegative autoimmune hepatitis based on response to immunosuppressive medication. The patient presented with severe hepatitis as evidenced by hyperbilirubinemia, elevation of her INR, decreased albumin, and results from her liver biopsy. Patient was started on a therapeutic trial of prednisone for the reasons discussed above. After only 4 days of immunosuppression, her liver enzymes (ALT)  that had been regularly elevated above 1000 decreased substantially.  This was highly suggestive of a positive effect of the prednisone and was c/w  presumed ANA-negative/SMA-negative autoimmune hepatitis.Labs showed continued normalization of bilirubin and INR, although she remained with mildly elevated ALT in the 40-45 range. CellCept was added on 5/12 and increased to 1000mg  BID on 12/04/16.Steroids tapered to off as of 07/2017.    We will maintain current level of CellCept.  ALT normal.  Patient feels completely well and labs stable. In remission on CellCept.     2. Cirrhosis, well compensated:  FIBROSCAN suggestive of cirrhosis and MRI suggestive of nodular contour with focal fibrosis.  Liver biopsy during acute episode did demonstrate possible bridging fibrosis. Inflammation now resolved.    3. HCC surveillance: 01/2023: Cirrhosis and confluence fibrosis, unchanged. No liver masses.       4. Evaluate portal HTN: MRI from 6/22 showed some changes c/w increasing portal HTN despite remission of autoimmune hepatitis. Cirrhotic stigmata with evidence of increased portal pressures including recannulized umbilical vein and small caliber upper abdominal varices. EGD 06/2021: No varices. Mild esophagitis.        RTC in 6 months with MRI same day. Labs at 3 months interval while on immunosuppression.  Labs performed at [facility].    Margaret Berger, M.D.  Professor of Medicine  Director, [facility]  [place] [facility] [place] [facility] [place]    [phone]

## 2023-02-20 MED FILL — MYCOPHENOLATE MOFETIL 500 MG TABLET: ORAL | 30 days supply | Qty: 120 | Fill #2

## 2023-02-26 ENCOUNTER — Encounter (INDEPENDENT_AMBULATORY_CARE_PROVIDER_SITE_OTHER): Payer: Self-pay

## 2023-03-19 NOTE — Unmapped (Signed)
[facility] Refill Coordination Note    Specialty Medication(s) to be Shipped:   Infectious Disease: Mycophenolate    Other medication(s) to be shipped: No additional medications requested for fill at this time     Margaret Berger, DOB: 1960-08-16  Phone: There are no phone numbers on file.      All above HIPAA information was verified with patient.     Was a Nurse, learning disability used for this call? No    Completed refill call assessment today to schedule patient's medication shipment from the [facility] [phone]).  All relevant notes have been reviewed.     Specialty medication(s) and dose(s) confirmed: Regimen is correct and unchanged.   Changes to medications: Margaret Berger reports no changes at this time.  Changes to insurance: No  New side effects reported not previously addressed with a pharmacist or physician: None reported  Questions for the pharmacist: No    Confirmed patient received a Conservation officer, historic buildings and a Surveyor, mining with first shipment. The patient will receive a drug information handout for each medication shipped and additional FDA Medication Guides as required.       DISEASE/MEDICATION-SPECIFIC INFORMATION        N/A    SPECIALTY MEDICATION ADHERENCE     Medication Adherence    Patient reported X missed doses in the last month: 0  Specialty Medication: mycophenolate 500 mg tablet (CELLCEPT)  Patient is on additional specialty medications: No  Patient is on more than two specialty medications: No  Any gaps in refill history greater than 2 weeks in the last 3 months: no  Demonstrates understanding of importance of adherence: yes  Informant: patient  Reliability of informant: reliable  Provider-estimated medication adherence level: good  Patient is at risk for Non-Adherence: No  Reasons for non-adherence: no problems identified              Were doses missed due to medication being on hold? No    mycophenolate 500 mg tablet (CELLCEPT)  : 10 days of medicine on hand REFERRAL TO PHARMACIST     Referral to the pharmacist: Not needed      [facility]     Shipping address confirmed in Epic.       Delivery Scheduled: Yes, Expected medication delivery date: 03/26/23.     Medication will be delivered via UPS to the prescription address in Epic WAM.    Dyson Sevey' W Danae Chen [facility] Specialty Technician

## 2023-03-24 ENCOUNTER — Other Ambulatory Visit (HOSPITAL_COMMUNITY)
Admission: RE | Admit: 2023-03-24 | Discharge: 2023-03-24 | Disposition: A | Discharge status: Home / Self Care | Payer: BC Managed Care – PPO | Source: Ambulatory Visit | Attending: Internal Medicine | Admitting: Internal Medicine

## 2023-03-24 ENCOUNTER — Ambulatory Visit (INDEPENDENT_AMBULATORY_CARE_PROVIDER_SITE_OTHER): Payer: BC Managed Care – PPO | Admitting: Internal Medicine

## 2023-03-24 ENCOUNTER — Encounter: Payer: Self-pay | Admitting: Internal Medicine

## 2023-03-24 VITALS — BP 130/76 | HR 75 | Temp 97.9°F | Wt 150.6 lb

## 2023-03-24 DIAGNOSIS — K754 Autoimmune hepatitis: Secondary | ICD-10-CM

## 2023-03-24 DIAGNOSIS — R5383 Other fatigue: Secondary | ICD-10-CM | POA: Diagnosis not present

## 2023-03-24 DIAGNOSIS — Z0000 Encounter for general adult medical examination without abnormal findings: Secondary | ICD-10-CM | POA: Diagnosis not present

## 2023-03-24 DIAGNOSIS — Z1231 Encounter for screening mammogram for malignant neoplasm of breast: Secondary | ICD-10-CM

## 2023-03-24 DIAGNOSIS — Z8249 Family history of ischemic heart disease and other diseases of the circulatory system: Secondary | ICD-10-CM

## 2023-03-24 DIAGNOSIS — E785 Hyperlipidemia, unspecified: Secondary | ICD-10-CM | POA: Diagnosis not present

## 2023-03-24 DIAGNOSIS — Z124 Encounter for screening for malignant neoplasm of cervix: Secondary | ICD-10-CM

## 2023-03-24 DIAGNOSIS — Z13820 Encounter for screening for osteoporosis: Secondary | ICD-10-CM | POA: Insufficient documentation

## 2023-03-24 NOTE — Patient Instructions (Signed)
GOOD TO SEE YOU Jessica Zuniga!   ASK YOUR LIVER DOCTOR ABOUT THE  TD (TETANUS) OR TDAP (TETANUS /PERTUSSIS)  VACCINE : YOUR LAST ONE WAS 2011 ACCORDING TO MY CHART IS 2011    DEXA SCAN ORDERED, TO BE DONE AT [facility] (WHERE YOUR LAST ONE WAS)  CT ANGIO OF CHEST ORDERED PER BROTHER'S ADVICE

## 2023-03-24 NOTE — Assessment & Plan Note (Signed)
Treated with steroids initially,   followed by Cellcept. Annual MRI July 2024 no tumors,  but portal htn stigmata noted ( upper abd varices)

## 2023-03-24 NOTE — Assessment & Plan Note (Signed)

## 2023-03-24 NOTE — Assessment & Plan Note (Signed)
[facility] DEXA Nov 2021:  T score -0.9

## 2023-03-24 NOTE — Progress Notes (Signed)
Patient ID: Jessica Zuniga, female    DOB: Dec 03, 1960  Age: 62 y.o. MRN: [id number]  The patient is here for annual preventive examination and management of other chronic and acute problems.   The risk factors are reflected in the social history.   The roster of all physicians providing medical care to patient - is listed in the Snapshot section of the chart.   Activities of daily living:  The patient is 100% independent in all ADLs: dressing, toileting, feeding as well as independent mobility   Home safety : The patient has smoke detectors in the home. They wear seatbelts.  There are no unsecured firearms at home. There is no violence in the home.    There is no risks for hepatitis, STDs or HIV. There is no   history of blood transfusion. They have no travel history to infectious disease endemic areas of the world.   The patient has seen their dentist in the last six month. They have seen their eye doctor in the last year. The patinet  denies slight hearing difficulty with regard to whispered voices and some television programs.  They have deferred audiologic testing in the last year.  They do not  have excessive sun exposure. Discussed the need for sun protection: hats, long sleeves and use of sunscreen if there is significant sun exposure.    Diet: the importance of a healthy diet is discussed. They do have a healthy diet.   The benefits of regular aerobic exercise were discussed. The patient  exercises  3 to 5 days per week  for  60 minutes.    Depression screen: there are no signs or vegative symptoms of depression- irritability, change in appetite, anhedonia, sadness/tearfullness.   The following portions of the patient's history were reviewed and updated as appropriate: allergies, current medications, past family history, past medical history,  past surgical history, past social history  and problem list.   Visual acuity was not assessed per patient preference since the patient has  regular follow up with an  ophthalmologist. Hearing and body mass index were assessed and reviewed.    During the course of the visit the patient was educated and counseled about appropriate screening and preventive services including : fall prevention , diabetes screening, nutrition counseling, colorectal cancer screening, and recommended immunizations.    Chief Complaint: NONE   Review of Symptoms  Patient denies headache, fevers, malaise, unintentional weight loss, skin rash, eye pain, sinus congestion and sinus pain, sore throat, dysphagia,  hemoptysis , cough, dyspnea, wheezing, chest pain, palpitations, orthopnea, edema, abdominal pain, nausea, melena, diarrhea, constipation, flank pain, dysuria, hematuria, urinary  Frequency, nocturia, numbness, tingling, seizures,  Focal weakness, Loss of consciousness,  Tremor, insomnia, depression, anxiety, and suicidal ideation.    Physical Exam:  BP 130/76   Pulse 75   Temp 97.9 F (36.6 C)   Wt 150 lb 9.6 oz (68.3 kg)   SpO2 98%   BMI 24.31 kg/m    Physical Exam Vitals reviewed.  Constitutional:      General: She is not in acute distress.    Appearance: Normal appearance. She is well-developed and normal weight. She is not ill-appearing, toxic-appearing or diaphoretic.  HENT:     Head: Normocephalic.     Right Ear: Tympanic membrane, ear canal and external ear normal. There is no impacted cerumen.     Left Ear: Tympanic membrane, ear canal and external ear normal. There is no impacted cerumen.     Nose:  Nose normal.     Mouth/Throat:     Mouth: Mucous membranes are moist.     Pharynx: Oropharynx is clear.  Eyes:     General: No scleral icterus.       Right eye: No discharge.        Left eye: No discharge.     Conjunctiva/sclera: Conjunctivae normal.     Pupils: Pupils are equal, round, and reactive to light.  Neck:     Thyroid: No thyromegaly.     Vascular: No carotid bruit or JVD.  Cardiovascular:     Rate and Rhythm:  Normal rate and regular rhythm.     Heart sounds: Normal heart sounds.  Pulmonary:     Effort: Pulmonary effort is normal. No respiratory distress.     Breath sounds: Normal breath sounds.  Chest:  Breasts:    Breasts are symmetrical.     Right: Normal. No swelling, inverted nipple, mass, nipple discharge, skin change or tenderness.     Left: Normal. No swelling, inverted nipple, mass, nipple discharge, skin change or tenderness.  Abdominal:     General: Bowel sounds are normal.     Palpations: Abdomen is soft. There is no mass.     Tenderness: There is no abdominal tenderness. There is no guarding or rebound.     Hernia: There is no hernia in the left inguinal area or right inguinal area.  Genitourinary:    Exam position: Lithotomy position.     Pubic Area: No rash or pubic lice.      Labia:        Right: No rash, tenderness, lesion or injury.        Left: No rash, tenderness, lesion or injury.      Vagina: Normal.     Cervix: Normal.     Uterus: Normal.      Adnexa: Right adnexa normal and left adnexa normal.  Musculoskeletal:        General: Normal range of motion.     Cervical back: Normal range of motion and neck supple.  Lymphadenopathy:     Cervical: No cervical adenopathy.     Upper Body:     Right upper body: No supraclavicular, axillary or pectoral adenopathy.     Left upper body: No supraclavicular, axillary or pectoral adenopathy.     Lower Body: No right inguinal adenopathy. No left inguinal adenopathy.  Skin:    General: Skin is warm and dry.  Neurological:     General: No focal deficit present.     Mental Status: She is alert and oriented to person, place, and time. Mental status is at baseline.  Psychiatric:        Mood and Affect: Mood normal.        Behavior: Behavior normal.        Thought Content: Thought content normal.        Judgment: Judgment normal.    Assessment and Plan: Cervical cancer screening -     Cytology - PAP  Encounter for  screening mammogram for malignant neoplasm of breast -     3D Screening Mammogram, Left and Right; Future  Hyperlipidemia LDL goal <130 -     Lipid panel -     LDL cholesterol, direct  Encounter for preventive health examination Assessment & Plan: age appropriate education and counseling updated, referrals for preventative services and immunizations addressed, dietary and smoking counseling addressed, most recent labs reviewed.  I have personally reviewed and have noted:  1) the patient's medical and social history 2) The pt's use of alcohol, tobacco, and illicit drugs 3) The patient's current medications and supplements 4) Functional ability including ADL's, fall risk, home safety risk, hearing and visual impairment 5) Diet and physical activities 6) Evidence for depression or mood disorder 7) The patient's height, weight, and BMI have been recorded in the chart    I have made referrals, and provided counseling and education based on review of the above    Other fatigue -     TSH  Autoimmune hepatitis (HCC) Assessment & Plan: Treated with steroids initially,   followed by Cellcept. Annual MRI July 2024 no tumors,  but portal htn stigmata noted ( upper abd varices)    Screening for osteoporosis Assessment & Plan: [facility] DEXA Nov 2021:  T score -0.9   Orders: -     DG Bone Density; Future  Family history of early CAD -     CT Angio Chest Pulmonary Embolism (PE) W or WO Contrast; Future    Return in about 1 year (around 03/23/2024).  Sherlene Shams, MD

## 2023-03-26 LAB — CYTOLOGY - PAP
Comment: NEGATIVE
Diagnosis: NEGATIVE
High risk HPV: NEGATIVE

## 2023-03-26 MED FILL — MYCOPHENOLATE MOFETIL 500 MG TABLET: ORAL | 30 days supply | Qty: 120 | Fill #3

## 2023-04-17 DIAGNOSIS — E785 Hyperlipidemia, unspecified: Secondary | ICD-10-CM | POA: Diagnosis not present

## 2023-04-17 DIAGNOSIS — R5383 Other fatigue: Secondary | ICD-10-CM | POA: Diagnosis not present

## 2023-04-17 DIAGNOSIS — K754 Autoimmune hepatitis: Secondary | ICD-10-CM | POA: Diagnosis not present

## 2023-04-18 LAB — CBC W/ DIFFERENTIAL
BANDED NEUTROPHILS ABSOLUTE COUNT: 0 10*3/uL (ref 0.0–0.1)
BASOPHILS ABSOLUTE COUNT: 0 10*3/uL (ref 0.0–0.2)
BASOPHILS RELATIVE PERCENT: 0 %
EOSINOPHILS ABSOLUTE COUNT: 0.1 10*3/uL (ref 0.0–0.4)
EOSINOPHILS RELATIVE PERCENT: 2 %
HEMATOCRIT: 43.3 % (ref 34.0–46.6)
HEMOGLOBIN: 14.4 g/dL (ref 11.1–15.9)
IMMATURE GRANULOCYTES: 0 %
LYMPHOCYTES ABSOLUTE COUNT: 1.7 10*3/uL (ref 0.7–3.1)
LYMPHOCYTES RELATIVE PERCENT: 32 %
MEAN CORPUSCULAR HEMOGLOBIN CONC: 33.3 g/dL (ref 31.5–35.7)
MEAN CORPUSCULAR HEMOGLOBIN: 31.6 pg (ref 26.6–33.0)
MEAN CORPUSCULAR VOLUME: 95 fL (ref 79–97)
MONOCYTES ABSOLUTE COUNT: 0.4 10*3/uL (ref 0.1–0.9)
MONOCYTES RELATIVE PERCENT: 8 %
NEUTROPHILS ABSOLUTE COUNT: 3.2 10*3/uL (ref 1.4–7.0)
NEUTROPHILS RELATIVE PERCENT: 58 %
PLATELET COUNT: 210 10*3/uL (ref 150–450)
RED BLOOD CELL COUNT: 4.56 x10E6/uL (ref 3.77–5.28)
RED CELL DISTRIBUTION WIDTH: 12.1 % (ref 11.7–15.4)
WHITE BLOOD CELL COUNT: 5.4 10*3/uL (ref 3.4–10.8)

## 2023-04-18 LAB — PROTIME-INR
INR: 1 (ref 0.9–1.2)
PROTHROMBIN TIME: 11.2 s (ref 9.1–12.0)

## 2023-04-18 LAB — COMPREHENSIVE METABOLIC PANEL
ALBUMIN: 4.8 g/dL (ref 3.9–4.9)
ALKALINE PHOSPHATASE: 57 IU/L (ref 44–121)
ALT (SGPT): 12 IU/L (ref 0–32)
AST (SGOT): 24 IU/L (ref 0–40)
BILIRUBIN TOTAL (MG/DL) IN SER/PLAS: 0.7 mg/dL (ref 0.0–1.2)
BLOOD UREA NITROGEN: 15 mg/dL (ref 8–27)
BUN / CREAT RATIO: 21 (ref 12–28)
CALCIUM: 10.2 mg/dL (ref 8.7–10.3)
CHLORIDE: 101 mmol/L (ref 96–106)
CO2: 23 mmol/L (ref 20–29)
CREATININE: 0.71 mg/dL (ref 0.57–1.00)
EGFR: 95 mL/min/{1.73_m2}
GLOBULIN, TOTAL: 2 g/dL (ref 1.5–4.5)
GLUCOSE: 115 mg/dL — ABNORMAL HIGH (ref 70–99)
POTASSIUM: 4.8 mmol/L (ref 3.5–5.2)
SODIUM: 142 mmol/L (ref 134–144)
TOTAL PROTEIN: 6.8 g/dL (ref 6.0–8.5)

## 2023-04-18 LAB — AFP TUMOR MARKER: AFP-TUMOR MARKER: 4.1 ng/mL (ref 0.0–9.2)

## 2023-04-18 LAB — LIPID PANEL
Chol/HDL Ratio: 2.4 {ratio} (ref 0.0–4.4)
Cholesterol, Total: 244 mg/dL — ABNORMAL HIGH (ref 100–199)
HDL: 100 mg/dL (ref >39)
LDL Chol Calc (NIH): 128 mg/dL — ABNORMAL HIGH (ref 0–99)
Triglycerides: 93 mg/dL (ref 0–149)
VLDL Cholesterol Cal: 16 mg/dL (ref 5–40)

## 2023-04-18 LAB — TSH: TSH: 1.47 u[IU]/mL (ref 0.450–4.500)

## 2023-04-18 LAB — LDL CHOLESTEROL, DIRECT: LDL Direct: 135 mg/dL — ABNORMAL HIGH (ref 0–99)

## 2023-04-20 ENCOUNTER — Ambulatory Visit
Admission: RE | Admit: 2023-04-20 | Discharge: 2023-04-20 | Disposition: A | Discharge status: Home / Self Care | Payer: BC Managed Care – PPO | Source: Ambulatory Visit | Attending: Internal Medicine | Admitting: Internal Medicine

## 2023-04-20 ENCOUNTER — Encounter: Payer: Self-pay | Admitting: Internal Medicine

## 2023-04-20 DIAGNOSIS — R160 Hepatomegaly, not elsewhere classified: Secondary | ICD-10-CM | POA: Diagnosis not present

## 2023-04-20 DIAGNOSIS — Z8249 Family history of ischemic heart disease and other diseases of the circulatory system: Secondary | ICD-10-CM | POA: Insufficient documentation

## 2023-04-20 DIAGNOSIS — S299XXA Unspecified injury of thorax, initial encounter: Secondary | ICD-10-CM | POA: Diagnosis not present

## 2023-04-20 DIAGNOSIS — H5213 Myopia, bilateral: Secondary | ICD-10-CM | POA: Diagnosis not present

## 2023-04-20 DIAGNOSIS — H3589 Other specified retinal disorders: Secondary | ICD-10-CM | POA: Diagnosis not present

## 2023-04-20 DIAGNOSIS — H52203 Unspecified astigmatism, bilateral: Secondary | ICD-10-CM | POA: Diagnosis not present

## 2023-04-20 DIAGNOSIS — H524 Presbyopia: Secondary | ICD-10-CM | POA: Diagnosis not present

## 2023-04-20 MED ORDER — IOHEXOL 350 MG/ML SOLN
75.0000 mL | Freq: Once | INTRAVENOUS | Status: AC | PRN
  Administered 2023-04-20: 75 mL via INTRAVENOUS

## 2023-04-21 ENCOUNTER — Telehealth: Payer: Self-pay | Admitting: Internal Medicine

## 2023-04-21 NOTE — Telephone Encounter (Signed)
Lft pt vm to call ofc to sch CT. thanks 

## 2023-04-22 NOTE — Unmapped (Signed)
[facility] Specialty and Home Delivery Pharmacy Refill Coordination Note    Margaret Berger, DOB: 1961/05/08  Phone: There are no phone numbers on file.      All above HIPAA information was verified with patient.         04/21/2023     2:19 PM   Specialty Rx Medication Refill Questionnaire   Which Medications would you like refilled and shipped? Cellcept   Please list all current allergies: Azathiaprine   Have you missed any doses in the last 30 days? No   Have you had any changes to your medication(s) since your last refill? No   How many days remaining of each medication do you have at home? 10   Have you experienced any side effects in the last 30 days? No   Please enter the full address (street address, city, state, zip code) where you would like your medication(s) to be delivered to. [address], [address], [place], [place] [address]   Please specify on which day you would like your medication(s) to arrive. Note: if you need your medication(s) within 3 days, please call the pharmacy to schedule your order at [phone]  04/29/2023   Has your insurance changed since your last refill? No   Would you like a pharmacist to call you to discuss your medication(s)? No   Do you require a signature for your package? (Note: if we are billing Medicare Part B or your order contains a controlled substance, we will require a signature) No         Completed refill call assessment today to schedule patient's medication shipment from the [facility] Delivery Pharm[facility] [phone]).  All relevant notes have been reviewed.       Confirmed patient received a Conservation officer, historic buildings and a Surveyor, mining with first shipment. The patient will receive a drug information handout for each medication shipped and additional FDA Medication Guides as required.         REFERRAL TO PHARMACIST     Referral to the pharmacist: Not needed      [facility]     Shipping address confirmed in Epic.     Delivery Scheduled: Yes, Expected medication delivery date: 04/29/23.     Medication will be delivered via UPS to the temporary address in Epic WAM.    [name]   [facility] Specialty and Home Delivery Pharmacy Specialty Technician

## 2023-04-28 MED FILL — MYCOPHENOLATE MOFETIL 500 MG TABLET: ORAL | 30 days supply | Qty: 120 | Fill #4

## 2023-04-30 ENCOUNTER — Ambulatory Visit: Admission: RE | Admit: 2023-04-30 | Payer: BC Managed Care – PPO | Source: Ambulatory Visit

## 2023-04-30 ENCOUNTER — Encounter (HOSPITAL_COMMUNITY): Payer: Self-pay

## 2023-04-30 ENCOUNTER — Other Ambulatory Visit (HOSPITAL_COMMUNITY): Payer: Self-pay | Admitting: *Deleted

## 2023-04-30 MED ORDER — METOPROLOL TARTRATE 100 MG PO TABS: ORAL_TABLET | ORAL | 0 refills | Status: DC

## 2023-05-04 ENCOUNTER — Ambulatory Visit
Admission: RE | Admit: 2023-05-04 | Discharge: 2023-05-04 | Disposition: A | Discharge status: Home / Self Care | Payer: BC Managed Care – PPO | Source: Ambulatory Visit | Attending: Internal Medicine | Admitting: Internal Medicine

## 2023-05-04 DIAGNOSIS — Z8249 Family history of ischemic heart disease and other diseases of the circulatory system: Secondary | ICD-10-CM | POA: Diagnosis not present

## 2023-05-04 DIAGNOSIS — Z136 Encounter for screening for cardiovascular disorders: Secondary | ICD-10-CM | POA: Insufficient documentation

## 2023-05-04 MED ORDER — NITROGLYCERIN 0.4 MG SL SUBL
0.8000 mg | SUBLINGUAL_TABLET | Freq: Once | SUBLINGUAL | Status: AC
  Administered 2023-05-04: 0.8 mg via SUBLINGUAL
  Filled 2023-05-04: qty 25

## 2023-05-04 MED ORDER — NITROGLYCERIN 0.4 MG SL SUBL
SUBLINGUAL_TABLET | SUBLINGUAL | Status: AC
  Filled 2023-05-04: qty 1

## 2023-05-04 MED ORDER — IOHEXOL 350 MG/ML SOLN
80.0000 mL | Freq: Once | INTRAVENOUS | Status: AC | PRN
  Administered 2023-05-04: 80 mL via INTRAVENOUS

## 2023-05-04 NOTE — Progress Notes (Signed)

## 2023-05-26 NOTE — Unmapped (Signed)
[facility] Specialty and Home Delivery Pharmacy Refill Coordination Note    Margaret Berger, DOB: 1960/10/27  Phone: There are no phone numbers on file.      All above HIPAA information was verified with patient.         05/23/2023     7:58 AM   Specialty Rx Medication Refill Questionnaire   Which Medications would you like refilled and shipped? Mycophenolate 11 days left   Please list all current allergies: None   Have you missed any doses in the last 30 days? No   Have you had any changes to your medication(s) since your last refill? No   How many days remaining of each medication do you have at home? 11   Have you experienced any side effects in the last 30 days? No   Please enter the full address (street address, city, state, zip code) where you would like your medication(s) to be delivered to. [address], [address], [place], [place] [address]   Please specify on which day you would like your medication(s) to arrive. Note: if you need your medication(s) within 3 days, please call the pharmacy to schedule your order at [phone]  05/28/2023   Has your insurance changed since your last refill? No   Would you like a pharmacist to call you to discuss your medication(s)? No   Do you require a signature for your package? (Note: if we are billing Medicare Part B or your order contains a controlled substance, we will require a signature) No         Completed refill call assessment today to schedule patient's medication shipment from the [facility] Specialty and Home Delivery Pharmacy [phone]).  All relevant notes have been reviewed.       Confirmed patient received a Conservation officer, historic buildings and a Surveyor, mining with first shipment. The patient will receive a drug information handout for each medication shipped and additional FDA Medication Guides as required.         REFERRAL TO PHARMACIST     Referral to the pharmacist: Not needed      [facility]     Shipping address confirmed in Epic.     Delivery Scheduled: Yes, Expected medication delivery date: 05/28/23.     Medication will be delivered via UPS to the temporary address in Epic WAM.    Margaret Berger   [facility] Specialty and Home Delivery Pharmacy Specialty Technician

## 2023-05-27 MED FILL — MYCOPHENOLATE MOFETIL 500 MG TABLET: ORAL | 30 days supply | Qty: 120 | Fill #5

## 2023-06-10 ENCOUNTER — Ambulatory Visit
Admission: RE | Admit: 2023-06-10 | Discharge: 2023-06-10 | Disposition: A | Discharge status: Home / Self Care | Payer: BC Managed Care – PPO | Source: Ambulatory Visit | Attending: Internal Medicine | Admitting: Internal Medicine

## 2023-06-10 DIAGNOSIS — Z1231 Encounter for screening mammogram for malignant neoplasm of breast: Secondary | ICD-10-CM | POA: Insufficient documentation

## 2023-06-10 DIAGNOSIS — Z13820 Encounter for screening for osteoporosis: Secondary | ICD-10-CM | POA: Insufficient documentation

## 2023-06-10 DIAGNOSIS — M810 Age-related osteoporosis without current pathological fracture: Secondary | ICD-10-CM | POA: Diagnosis not present

## 2023-06-10 DIAGNOSIS — Z780 Asymptomatic menopausal state: Secondary | ICD-10-CM | POA: Diagnosis not present

## 2023-06-10 DIAGNOSIS — Z1289 Encounter for screening for malignant neoplasm of other sites: Principal | ICD-10-CM

## 2023-06-10 DIAGNOSIS — K754 Autoimmune hepatitis: Principal | ICD-10-CM

## 2023-06-10 DIAGNOSIS — K7460 Unspecified cirrhosis of liver: Principal | ICD-10-CM

## 2023-06-17 DIAGNOSIS — K754 Autoimmune hepatitis: Principal | ICD-10-CM

## 2023-06-17 MED ORDER — MYCOPHENOLATE MOFETIL 500 MG TABLET
ORAL_TABLET | Freq: Two times a day (BID) | ORAL | 1 refills | 90 days | Status: CP
Start: 2023-06-17 — End: ?
  Filled 2023-06-30: qty 120, 30d supply, fill #0

## 2023-06-17 NOTE — Unmapped (Signed)
Refill request for Cellcept    LCV 01/28/23    Labs done 04/17/23    Will authorize refill at this time

## 2023-06-19 NOTE — Unmapped (Signed)
[facility] Specialty and Home Delivery Pharmacy Refill Coordination Note    Margaret Berger, DOB: 03/11/1961  Phone: There are no phone numbers on file.      All above HIPAA information was verified with patient.         06/18/2023     1:34 PM   Specialty Rx Medication Refill Questionnaire   Which Medications would you like refilled and shipped? Mycophenolate   Please list all current allergies: None   Have you missed any doses in the last 30 days? No   Have you had any changes to your medication(s) since your last refill? No   How many days remaining of each medication do you have at home? 14   Have you experienced any side effects in the last 30 days? No   Please enter the full address (street address, city, state, zip code) where you would like your medication(s) to be delivered to. [address], [place], [place] [address]   Please specify on which day you would like your medication(s) to arrive. Note: if you need your medication(s) within 3 days, please call the pharmacy to schedule your order at [phone]  07/01/2023   Has your insurance changed since your last refill? No   Would you like a pharmacist to call you to discuss your medication(s)? No   Do you require a signature for your package? (Note: if we are billing Medicare Part B or your order contains a controlled substance, we will require a signature) No         Completed refill call assessment today to schedule patient's medication shipment from the [facility] Specialty and Home Delivery Pharmacy [phone]).  All relevant notes have been reviewed.       Confirmed patient received a Conservation officer, historic buildings and a Surveyor, mining with first shipment. The patient will receive a drug information handout for each medication shipped and additional FDA Medication Guides as required.         REFERRAL TO PHARMACIST     Referral to the pharmacist: Not needed      [facility]     Shipping address confirmed in Epic.     Delivery Scheduled: Yes, Expected medication delivery date: 07/01/23.     Medication will be delivered via Next Day Courier to the prescription address in Epic WAM.    [name]   [facility] Specialty and Home Delivery Pharmacy Specialty Technician

## 2023-07-21 NOTE — Unmapped (Signed)
[facility] Specialty and Home Delivery Pharmacy Refill Coordination Note    Margaret Berger, DOB: 1961/06/20  Phone: There are no phone numbers on file.      All above HIPAA information was verified with patient.         07/19/2023     2:02 PM   Specialty Rx Medication Refill Questionnaire   Which Medications would you like refilled and shipped? Mycophenolate   Please list all current allergies: Azathiaprine   Have you missed any doses in the last 30 days? No   Have you had any changes to your medication(s) since your last refill? No   How many days remaining of each medication do you have at home? 8   Have you experienced any side effects in the last 30 days? No   Please enter the full address (street address, city, state, zip code) where you would like your medication(s) to be delivered to. [address], [place], [place] [address]   Please specify on which day you would like your medication(s) to arrive. Note: if you need your medication(s) within 3 days, please call the pharmacy to schedule your order at [phone]  07/27/2023   Has your insurance changed since your last refill? No   Would you like a pharmacist to call you to discuss your medication(s)? No   Do you require a signature for your package? (Note: if we are billing Medicare Part B or your order contains a controlled substance, we will require a signature) No         Completed refill call assessment today to schedule patient's medication shipment from the [facility] Specialty and Home Delivery Pharmacy [phone]).  All relevant notes have been reviewed.       Confirmed patient received a Conservation officer, historic buildings and a Surveyor, mining with first shipment. The patient will receive a drug information handout for each medication shipped and additional FDA Medication Guides as required.         REFERRAL TO PHARMACIST     Referral to the pharmacist: Not needed      [facility]     Shipping address confirmed in Epic.     Delivery Scheduled: Yes, Expected medication delivery date: 07/27/23.     Medication will be delivered via Same Day Courier to the prescription address in Epic WAM.    [name]   [facility] Specialty and Home Delivery Pharmacy Specialty Technician

## 2023-07-27 MED FILL — MYCOPHENOLATE MOFETIL 500 MG TABLET: ORAL | 30 days supply | Qty: 120 | Fill #1

## 2023-08-11 ENCOUNTER — Inpatient Hospital Stay: Admit: 2023-08-11 | Discharge: 2023-08-12 | Payer: BLUE CROSS/BLUE SHIELD

## 2023-08-11 DIAGNOSIS — K7469 Other cirrhosis of liver: Secondary | ICD-10-CM | POA: Diagnosis not present

## 2023-08-11 DIAGNOSIS — K754 Autoimmune hepatitis: Secondary | ICD-10-CM | POA: Diagnosis not present

## 2023-08-11 DIAGNOSIS — K766 Portal hypertension: Secondary | ICD-10-CM | POA: Diagnosis not present

## 2023-08-11 DIAGNOSIS — C220 Liver cell carcinoma: Secondary | ICD-10-CM | POA: Diagnosis not present

## 2023-08-11 DIAGNOSIS — Z1289 Encounter for screening for malignant neoplasm of other sites: Secondary | ICD-10-CM | POA: Diagnosis not present

## 2023-08-11 MED ADMIN — gadopiclenol (ELUCIREM,VUEWAY) injection 0.1 mL/kg (Dosing Weight): .1 mL/kg | INTRAVENOUS | @ 13:00:00 | Stop: 2023-08-11

## 2023-08-12 ENCOUNTER — Ambulatory Visit
Admit: 2023-08-12 | Discharge: 2023-08-13 | Payer: BLUE CROSS/BLUE SHIELD | Attending: Gastroenterology | Primary: Gastroenterology

## 2023-08-12 DIAGNOSIS — K754 Autoimmune hepatitis: Secondary | ICD-10-CM | POA: Diagnosis not present

## 2023-08-12 NOTE — Unmapped (Signed)
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 Continue this medication. MRI from yesterday looked good. Labs today. Return to office in 6 months with MRI ordered fro same day.  Please call radiology to schedule the imaging of your liver [phone]

## 2023-08-12 NOTE — Unmapped (Signed)
[facility] LIVER CENTER    [name], M.D.  Professor of Medicine  Division of GI and Hepatology  [place] [facility] [place] [facility] [place]    [phone]      [phone]    [name]    Referring MD: Midge Minium, MD      Chief complaint: Office follow-up for elevation of liver enzymes. Hospitalization (08/2016) for acute hepatitis of uncertain etiology, presumed autoimmune hepatitis treated with prednisone and mycophenylate. DRUG FEVER ASSOCIATED WITH AZATHIOPRINE. STEROIDS STOPPED 07/2017. CONTINUES ON CELLCEPT 1 GRAM BID FOR IMMUNOSUPPRESSION    Interval history: Patient continues to feel great. Traveling between [place] [facility] and [place]. She denies fever, cough, sob, N/V, abdominal pain, chest pain, or skin rash.  She has been maintained on mycophenylate 1gram bid. No new health related concerns.     Present illness at presentation:  Patient is a 63 y.o. Caucasian female who was hospitalized at [facility] between approximately February 12 -  August 29, 2016 with acute hepatitis. The patient had about 10 weeks of abnormal liver tests documented by her primary physician and her primary gastroenterologist. This was preceded by a short flu-like illness characterized by fatigue, muscle aches, and low-grade fever. All of the symptoms have completely remained resolved since her  Visit 09/10/2016. However she was left with elevated liver tests and had an extensive workup as an outpatient and subsequently as an inpatient at [facility] to further define the etiology of her liver disease.  Additional details of her history and prior outpatient evaluation as well as her inpatient evaluation are available from our initial consultation note and follow-up notes while she was hospitalized.       After review of the liver biopsy and all serologic data thus far which has been unrevealing, it was felt that a therapeutic trial of prednisone for presumed seronegative autoimmune hepatitis was warranted. Prednisone  40 mg of daily started on 08/29/16. She had a good biochemical response with marked improvement in ALT, INR, including a slow but steady decrease in Total/direct bilirubin.     Patient had an extensive serological evaluation which was not revealing:  1. Antinuclear antibody and anti-smooth muscle antibody were negative. Immunoglobulin G was not elevated (1061)  2. Viral serologies including acute hepatitis A, hepatitis B, hepatitis C, HCV RNA, EBV PCR, CMV PCR, HHV-6 were all negative.  3. Liver ultrasound demonstrated patent vasculature with a nodular-appearing heterogeneous liver.  4. Percutaneous liver biopsy 08/2016 at [facility]:    A: Liver, core biopsy   - Severe active hepatitis with confluent areas of periportal hepatocellular necrosis (approximately 10-20% of sampled parenchyma) (see comment)   - Trichrome stain demonstrates at least periportal fibrosis and is suspicious for bridging fibrosis     Potential etiologies to consider include, but are not limited to, infection, drug/toxin-induced liver injury, seronegative autoimmune hepatitis, and metabolic disorders. The trichrome stain findings suggest that this process is at least subacute in duration.      Liver biopsy slides were personally reviewed by Dr. Sharon Mt  with the pathologist.  No viral inclusions were noted and only few plasma cells were visualized in the liver biopsy which was an adequate specimen. No cytopathic effect. EBV immunostains were negative. Fe in hepatocytes only grade 1/4    5.  Evaluation for Zoster: Dr. Foy Guadalajara received phone call from patient's primary GI MD that patient's Zoster IgM from 09/05/2016 was positive with titer 1.17 approximately one week after starting prednisone. Review of her record though revealed this  test to have been negative on 08/29/2016.  Dr. Sharon Mt discussed these findings with Dr. Lytle Butte and the decision was made to proceed with patient starting Valtrex 500 mg bid.  She was seen by Dr. 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   Recommendations from ID: Continue valacyclovir 500 mg bid.  Advised readdressing stopping Valtrex if her repeat IgM is negative or once she is on <15 mg prednisone daily.  Valtrex was discontinued last week.  Bactrim prophylaxis while prednisone >15 mg q 24. 1 DS tablet M/W/F. The following vaccines were recommended for her to obtain under the care of her PCP: Twinrix, PCV13, and PPSV23. She should have these performed by her PCP when prednisone dose is  <20 mg daily.     Immunosuppressive history:  Patient was started on azathioprine for steroid sparing benefits in early April.  After 2 weeks patient developed fevers. ID workup negative during hospitalization.  Azathioprine was held during that hospitalization and fevers resolved. When she restarted azathioprine as outpatient, immediately had high fevers and shaking chills.  This rechallenge was highly suggestive of drug fever from azathioprine.    On 5/12 patient was started on CellCept 500mg  bid and continued on prednisone 15 mg daily.  On 12/03/16, liver enzymes remained elevated. Cellcept was increased to 1000mg  BID on 12/04/16. Prednisone tapered to off as of 07/2017.     10 sys ROS otherwise negative.     Past medical history:  Patient has generally been healthy until the current hepatitis began in December 2017.  1. No history of diabetes, coronary artery disease, hypertension, or lung disease.  2. No history of other autoimmune systemic diseases  3. Excision of incidental benign adnexal cyst  3. FIBROSCAN 11/05/16: 17.7 kps c/w stage F4 (Patient ate breakfast 3 hours prior).  4. MRI 10/2017: HEPATOBILIARY: Nodular liver contour compatible with history of cirrhosis and similar to prior. Again seen is a geographic region of T1 hypointensity and corresponding slight T2 hyperintensity in the posterior right hepatic lobe which is overall similar in extent when compared to prior imaging. This region demonstrates progressive postcontrast enhancement as before. Overall similar degree of capsular retraction most pronounced along the right anterior margin of the liver (13:29). No discrete early arterially enhancing or washout liver lesion identified. No biliary ductal dilatation. Similar appearance and position of the borderline hydropic gallbladder.  PANCREAS: Pancreas divisum. No ductal dilatation.  5. MRI 06/2020 and 12/2020: Morphologic features consistent with history of cirrhosis. No sequela of portal hypertension.No MR evidence of HCC.  Similar appearing probable confluent fibrosis in the right hepatic lobe.   6. HCC Surveillance: MRI 07/2023: No liver masses. Unchanged evidence of cirrhosis and confluent fibrosis with evidence of portal HTN  7. EGD 06/2021: No varices      Allergies   Allergen Reactions    Azathioprine Other (See Comments)     Fever  Other reaction(s): Other (See Comments)  Fever       Current Outpatient Medications   Medication Sig Dispense Refill    mycophenolate (CELLCEPT) 500 mg tablet Take 2 tablets (1,000 mg total) by mouth 2 (two) times a day with meals. 360 tablet 1     No current facility-administered medications for this visit.     Social history: The patient is married. She works as a Materials engineer. They have traveled extensively.   She does not drink any alcohol. She does not smoke cigarettes.  Family history: Negative for liver disease or liver cancer. Negative for autoimmune systemic diseases.    BP 165/88 (BP Position: Sitting)  - Pulse 75  - Temp 36.2 ??C (97.1 ??F)  - Ht 167.6 cm (5' 5.98)  - Wt 68 kg (150 lb)  - SpO2 100%  - BMI 24.22 kg/m??     Pleasant individual in NAD    HEENT: Sclera are anicteric, no temporal muscle loss  NECK: No thyromegaly or lymphadenopathy, No carotid bruits  Chest: Clear to auscultation and percussion  Heart: S1, S2, RR, No murmurs  Abdomen: Soft, non-tender, non-distended, no hepatosplenomegaly, no masses appreciated, no ascites  Skin: No spider angiomata, No rashes  Extremities: Without pedal edema, no palmar erythema. Left decubital fossa swollen, mildly tender. No erythema.  Warm extremities.   Neuro: Grossly intact, No focal deficits      Labs pending at [facility]          Impression:  1. Seronegative autoimmune hepatitis based on response to immunosuppressive medication. The patient presented with severe hepatitis as evidenced by hyperbilirubinemia, elevation of her INR, decreased albumin, and results from her liver biopsy. Patient was started on a therapeutic trial of prednisone for the reasons discussed above. After only 4 days of immunosuppression, her liver enzymes (ALT)  that had been regularly elevated above 1000 decreased substantially.  This was highly suggestive of a positive effect of the prednisone and was c/w  presumed ANA-negative/SMA-negative autoimmune hepatitis.Labs showed continued normalization of bilirubin and INR, although she remained with mildly elevated ALT in the 40-45 range. CellCept was added on 5/12 and increased to 1000mg  BID on 12/04/16.Steroids tapered to off as of 07/2017.    We will maintain current level of CellCept.  ALT normal.  Patient feels completely well and labs stable. In remission on CellCept.     2. Cirrhosis, well compensated:  FIBROSCAN suggestive of cirrhosis and MRI suggestive of nodular contour with focal fibrosis.  Liver biopsy during acute episode did demonstrate possible bridging fibrosis. Inflammation now resolved.    3. HCC surveillance: 07/2023: Cirrhosis and confluence fibrosis, unchanged. No liver masses.       4. Evaluate portal HTN: MRI from 6/22 showed some changes c/w increasing portal HTN despite remission of autoimmune hepatitis. Cirrhotic stigmata with evidence of increased portal pressures including recannulized umbilical vein and small caliber upper abdominal varices. EGD 06/2021: No varices. Mild esophagitis.        RTC in 6 months with MRI same day. Labs at 3 months interval while on immunosuppression.  Labs performed at [facility].    [name], M.D., FAASLD  Professor of Medicine  Division of GI and Hepatology  [place] [facility] [place] [facility] [place]    [phone]

## 2023-08-24 NOTE — Unmapped (Signed)
[facility] Specialty and Home Delivery Pharmacy Refill Coordination Note    Margaret Berger, DOB: 04-May-1961  Phone: There are no phone numbers on file.      All above HIPAA information was verified with patient.         08/22/2023    10:34 AM   Specialty Rx Medication Refill Questionnaire   Which Medications would you like refilled and shipped? Mycophenolate   Please list all current allergies: None   Have you missed any doses in the last 30 days? No   Have you had any changes to your medication(s) since your last refill? No   How many days remaining of each medication do you have at home? 10   Have you experienced any side effects in the last 30 days? No   Please enter the full address (street address, city, state, zip code) where you would like your medication(s) to be delivered to. [address] [place], [place] [address]   Please specify on which day you would like your medication(s) to arrive. Note: if you need your medication(s) within 3 days, please call the pharmacy to schedule your order at [phone]  08/28/2023   Has your insurance changed since your last refill? No   Would you like a pharmacist to call you to discuss your medication(s)? No   Do you require a signature for your package? (Note: if we are billing Medicare Part B or your order contains a controlled substance, we will require a signature) No         Completed refill call assessment today to schedule patient's medication shipment from the [facility] Specialty and Home Delivery Pharmacy [phone]).  All relevant notes have been reviewed.       Confirmed patient received a Conservation officer, historic buildings and a Surveyor, mining with first shipment. The patient will receive a drug information handout for each medication shipped and additional FDA Medication Guides as required.         REFERRAL TO PHARMACIST     Referral to the pharmacist: Not needed      [facility]     Shipping address confirmed in Epic.     Delivery Scheduled: Yes, Expected medication delivery date: 08/28/23.     Medication will be delivered via Next Day Courier to the prescription address in Epic WAM.    [name]   [facility] Specialty and Home Delivery Pharmacy Specialty Technician

## 2023-08-27 MED FILL — MYCOPHENOLATE MOFETIL 500 MG TABLET: ORAL | 30 days supply | Qty: 120 | Fill #2

## 2023-08-28 DIAGNOSIS — K754 Autoimmune hepatitis: Secondary | ICD-10-CM | POA: Diagnosis not present

## 2023-08-29 LAB — AFP TUMOR MARKER: AFP-TUMOR MARKER: 3.7 ng/mL (ref 0.0–9.2)

## 2023-08-29 LAB — COMPREHENSIVE METABOLIC PANEL
ALBUMIN: 4.5 g/dL (ref 3.9–4.9)
ALKALINE PHOSPHATASE: 60 IU/L (ref 44–121)
ALT (SGPT): 22 IU/L (ref 0–32)
AST (SGOT): 23 IU/L (ref 0–40)
BILIRUBIN TOTAL (MG/DL) IN SER/PLAS: 0.6 mg/dL (ref 0.0–1.2)
BLOOD UREA NITROGEN: 17 mg/dL (ref 8–27)
BUN / CREAT RATIO: 24 (ref 12–28)
CALCIUM: 9.6 mg/dL (ref 8.7–10.3)
CHLORIDE: 104 mmol/L (ref 96–106)
CO2: 23 mmol/L (ref 20–29)
CREATININE: 0.7 mg/dL (ref 0.57–1.00)
EGFR: 97 mL/min/{1.73_m2}
GLOBULIN, TOTAL: 2 g/dL (ref 1.5–4.5)
GLUCOSE: 97 mg/dL (ref 70–99)
POTASSIUM: 4.8 mmol/L (ref 3.5–5.2)
SODIUM: 140 mmol/L (ref 134–144)
TOTAL PROTEIN: 6.5 g/dL (ref 6.0–8.5)

## 2023-08-29 LAB — CBC W/ DIFFERENTIAL
BANDED NEUTROPHILS ABSOLUTE COUNT: 0 10*3/uL (ref 0.0–0.1)
BASOPHILS ABSOLUTE COUNT: 0 10*3/uL (ref 0.0–0.2)
BASOPHILS RELATIVE PERCENT: 0 %
EOSINOPHILS ABSOLUTE COUNT: 0.1 10*3/uL (ref 0.0–0.4)
EOSINOPHILS RELATIVE PERCENT: 2 %
HEMATOCRIT: 39.8 % (ref 34.0–46.6)
HEMOGLOBIN: 13.1 g/dL (ref 11.1–15.9)
IMMATURE GRANULOCYTES: 0 %
LYMPHOCYTES ABSOLUTE COUNT: 1.3 10*3/uL (ref 0.7–3.1)
LYMPHOCYTES RELATIVE PERCENT: 24 %
MEAN CORPUSCULAR HEMOGLOBIN CONC: 32.9 g/dL (ref 31.5–35.7)
MEAN CORPUSCULAR HEMOGLOBIN: 30.8 pg (ref 26.6–33.0)
MEAN CORPUSCULAR VOLUME: 93 fL (ref 79–97)
MONOCYTES ABSOLUTE COUNT: 0.4 10*3/uL (ref 0.1–0.9)
MONOCYTES RELATIVE PERCENT: 8 %
NEUTROPHILS ABSOLUTE COUNT: 3.7 10*3/uL (ref 1.4–7.0)
NEUTROPHILS RELATIVE PERCENT: 66 %
PLATELET COUNT: 213 10*3/uL (ref 150–450)
RED BLOOD CELL COUNT: 4.26 x10E6/uL (ref 3.77–5.28)
RED CELL DISTRIBUTION WIDTH: 12.3 % (ref 11.7–15.4)
WHITE BLOOD CELL COUNT: 5.6 10*3/uL (ref 3.4–10.8)

## 2023-08-29 LAB — BILIRUBIN, DIRECT: BILIRUBIN DIRECT: 0.19 mg/dL (ref 0.00–0.40)

## 2023-08-29 LAB — PROTIME-INR
INR: 1 (ref 0.9–1.2)
PROTHROMBIN TIME: 10.9 s (ref 9.1–12.0)

## 2023-09-16 NOTE — Unmapped (Signed)
 [facility] Specialty and Home Delivery Pharmacy Refill Coordination Note    Margaret Berger, DOB: [birth year]  Phone: There are no phone numbers on file.      All above HIPAA information was verified with patient.         09/14/2023    12:34 PM   Specialty Rx Medication Refill Questionnaire   Which Medications would you like refilled and shipped? Mycophenolate   Please list all current allergies: None   Have you missed any doses in the last 30 days? No   Have you had any changes to your medication(s) since your last refill? No   How many days remaining of each medication do you have at home? 10   Have you experienced any side effects in the last 30 days? No   Please enter the full address (street address, city, state, zip code) where you would like your medication(s) to be delivered to. [address] [place], [place] [address]   Please specify on which day you would like your medication(s) to arrive. Note: if you need your medication(s) within 3 days, please call the pharmacy to schedule your order at [phone]  09/22/2023   Has your insurance changed since your last refill? No   Would you like a pharmacist to call you to discuss your medication(s)? No   Do you require a signature for your package? (Note: if we are billing Medicare Part B or your order contains a controlled substance, we will require a signature) No         Completed refill call assessment today to schedule patient's medication shipment from the [facility] Delivery [facility] [phone]).  All relevant notes have been reviewed.       Confirmed patient received a Conservation officer, historic buildings and a Surveyor, mining with first shipment. The patient will receive a drug information handout for each medication shipped and additional FDA Medication Guides as required.         REFERRAL TO PHARMACIST     Referral to the pharmacist: Not needed      [facility]     Shipping address confirmed in Epic.     Delivery Scheduled: Yes, Expected medication delivery date: 09/22/2023.     Medication will be delivered via Next Day Courier to the prescription address in Epic WAM.    Delaine Lame   [facility] Special[facility] and [facility] Delivery Pharm[facility] Specialty Technician

## 2023-09-21 MED FILL — MYCOPHENOLATE MOFETIL 500 MG TABLET: ORAL | 30 days supply | Qty: 120 | Fill #3

## 2023-10-14 NOTE — Unmapped (Signed)
 [facility] Specialty and [facility] Delivery Pharmacy Refill Coordination Note    Margaret Berger, DOB: [birth year]  Phone: There are no phone numbers on file.      All above HIPAA information was verified with patient.         10/12/2023     8:23 AM   Specialty Rx Medication Refill Questionnaire   Which Medications would you like refilled and shipped? Mycophenolate   Please list all current allergies: None   Have you missed any doses in the last 30 days? No   Have you had any changes to your medication(s) since your last refill? No   How many days remaining of each medication do you have at home? 12   Have you experienced any side effects in the last 30 days? No   Please enter the full address (street address, city, state, zip code) where you would like your medication(s) to be delivered to. [address], [place], [place] [address]   Please specify on which day you would like your medication(s) to arrive. Note: if you need your medication(s) within 3 days, please call the pharmacy to schedule your order at [phone]  10/21/2023   Has your insurance changed since your last refill? No   Would you like a pharmacist to call you to discuss your medication(s)? No   Do you require a signature for your package? (Note: if we are billing Medicare Part B or your order contains a controlled substance, we will require a signature) No   I have been provided my out of pocket cost for my medication and approve the pharmacy to charge the amount to my credit card on file. Yes         Completed refill call assessment today to schedule patient's medication shipment from the [facility] [phone]).  All relevant notes have been reviewed.       Confirmed patient received a Conservation officer, historic buildings and a Surveyor, mining with first shipment. The patient will receive a drug information handout for each medication shipped and additional FDA Medication Guides as required.         REFERRAL TO PHARMACIST     Referral to the pharmacist: Not needed      [facility]     Shipping address confirmed in Epic.     Delivery Scheduled: Yes, Expected medication delivery date: 10/21/2023.     Medication will be delivered via Same Day Courier to the prescription address in Epic WAM.    Delaine Lame   [facility] and [facility] Delivery Pharmacy Specialty Technician

## 2023-10-21 MED FILL — MYCOPHENOLATE MOFETIL 500 MG TABLET: ORAL | 30 days supply | Qty: 120 | Fill #4

## 2023-11-11 NOTE — Unmapped (Signed)
 [facility] Specialty and Home Delivery Pharmacy Clinical Assessment & Refill Coordination Note    RWAN KOFLER, DOB: Oct 01, 1960  Phone: There are no phone numbers on file.    All above HIPAA information was verified with patient.     Was a Nurse, learning disability used for this call? No    Specialty Medication(s):   Infectious Disease: Mycophenolate     Current Outpatient Medications   Medication Sig Dispense Refill    mycophenolate (CELLCEPT) 500 mg tablet Take 2 tablets (1,000 mg total) by mouth 2 (two) times a day with meals. 360 tablet 1     No current facility-administered medications for this visit.        Changes to medications: Rochelle reports no changes at this time.    Medication list has been reviewed and updated in Epic: Yes    Allergies   Allergen Reactions    Azathioprine Other (See Comments)     Fever  Other reaction(s): Other (See Comments)  Fever       Changes to allergies: No    Allergies have been reviewed and updated in Epic: Yes    SPECIALTY MEDICATION ADHERENCE     mycophenolate 500 mg: 14 days of medicine on hand       Medication Adherence    Patient reported X missed doses in the last month: 0  Specialty Medication: mycophenolate 1000mg  BID  Patient is on additional specialty medications: No  Informant: patient          Specialty medication(s) dose(s) confirmed: Regimen is correct and unchanged.     Are there any concerns with adherence? No    Adherence counseling provided? Not needed    CLINICAL MANAGEMENT AND INTERVENTION      Clinical Benefit Assessment:    Do you feel the medicine is effective or helping your condition? Yes    Clinical Benefit counseling provided? Progress note from 08/12/23 shows evidence of clinical benefit    Adverse Effects Assessment:    Are you experiencing any side effects? No    Are you experiencing difficulty administering your medicine? No    Quality of Life Assessment:    Quality of Life    Rheumatology  Oncology  Dermatology  Cystic Fibrosis          How many days over the past month did your autoimmune hepatitis  keep you from your normal activities? For example, brushing your teeth or getting up in the morning. Patient declined to answer    Have you discussed this with your provider? Not needed    Acute Infection Status:    Acute infections noted within Epic:  No active infections    Patient reported infection: None    Therapy Appropriateness:    Is therapy appropriate based on current medication list, adverse reactions, adherence, clinical benefit and progress toward achieving therapeutic goals? Yes, therapy is appropriate and should be continued     Clinical Intervention:    Was an intervention completed as part of this clinical assessment? No    DISEASE/MEDICATION-SPECIFIC INFORMATION      N/A    Other Infectious Disease: Not Applicable    PATIENT SPECIFIC NEEDS     Does the patient have any physical, cognitive, or cultural barriers? No    Is the patient high risk? Yes, patient is taking a REMS drug. Medication is dispensed in compliance with REMS program    Does the patient require physician intervention or other additional services (i.e., nutrition, smoking cessation, social work)? No  Does the patient have an additional or emergency contact listed in their chart? Yes    SOCIAL DETERMINANTS OF HEALTH     At the [facility], we have learned that life circumstances - like trouble affording food, housing, utilities, or transportation can affect the health of many of our patients.   That is why we wanted to ask: are you currently experiencing any life circumstances that are negatively impacting your health and/or quality of life? Patient declined to answer    Social Drivers of Health     Food Insecurity: Not on file   Tobacco Use: Low Risk  (08/12/2023)    Patient History     Smoking Tobacco Use: Never     Smokeless Tobacco Use: Never     Passive Exposure: Not on file   Transportation Needs: Not on file   Alcohol Use: Not on file   Housing: Not on file   Physical Activity: Not on file Utilities: Not on file   Stress: Not on file   Interpersonal Safety: Not on file   Substance Use: Not on file (05/21/2023)   Intimate Partner Violence: Not on file   Social Connections: Not on file   Financial Resource Strain: Not on file   Health Literacy: Not on file   Internet Connectivity: Not on file       Would you be willing to receive help with any of the needs that you have identified today? Not applicable       SHIPPING     Specialty Medication(s) to be Shipped:   Infectious Disease: Mycophenolate    Other medication(s) to be shipped: No additional medications requested for fill at this time     Changes to insurance: No    Cost and Payment: Patient has a copay of $80.12. They are aware and have authorized the pharmacy to charge the credit card on file.    Delivery Scheduled: Yes, Expected medication delivery date: 11/20/23.     Medication will be delivered via Same Day Courier to the confirmed prescription address in [facility].    The patient will receive a drug information handout for each medication shipped and additional FDA Medication Guides as required.  Verified that patient has previously received a Conservation officer, historic buildings and a Surveyor, mining.    The patient or caregiver noted above participated in the development of this care plan and knows that they can request review of or adjustments to the care plan at any time.      All of the patient's questions and concerns have been addressed.    Riesa Chary, PharmD   [facility] Delivery Pharm[facility] Specialty Pharmacist

## 2023-11-20 MED FILL — MYCOPHENOLATE MOFETIL 500 MG TABLET: ORAL | 30 days supply | Qty: 120 | Fill #5

## 2023-12-13 DIAGNOSIS — K754 Autoimmune hepatitis: Principal | ICD-10-CM

## 2023-12-13 MED ORDER — MYCOPHENOLATE MOFETIL 500 MG TABLET
ORAL_TABLET | Freq: Two times a day (BID) | ORAL | 1 refills | 90.00000 days
Start: 2023-12-13 — End: ?

## 2023-12-14 MED ORDER — MYCOPHENOLATE MOFETIL 500 MG TABLET
ORAL_TABLET | Freq: Two times a day (BID) | ORAL | 1 refills | 90.00000 days | Status: CP
Start: 2023-12-14 — End: ?
  Filled 2023-12-24: qty 120, 30d supply, fill #0

## 2023-12-14 NOTE — Unmapped (Signed)
 [facility] Specialty and Home Delivery Pharmacy Refill Coordination Note    Margaret Berger, DOB: May 01, 1961  Phone: There are no phone numbers on file.      All above HIPAA information was verified with patient.         12/14/2023     3:35 PM   Specialty Rx Medication Refill Questionnaire   Which Medications would you like refilled and shipped? Mycophenolate 26 days   Please list all current allergies: Azathiaprine   Have you missed any doses in the last 30 days? No   Have you had any changes to your medication(s) since your last refill? No   How many days remaining of each medication do you have at home? 13 days   Have you experienced any side effects in the last 30 days? No   Please enter the full address (street address, city, state, zip code) where you would like your medication(s) to be delivered to. [address], [place], [place] [address]   Please specify on which day you would like your medication(s) to arrive. Note: if you need your medication(s) within 3 days, please call the pharmacy to schedule your order at [phone]  12/25/2023   Has your insurance changed since your last refill? No   Would you like a pharmacist to call you to discuss your medication(s)? No   Do you require a signature for your package? (Note: if we are billing Medicare Part B or your order contains a controlled substance, we will require a signature) No   I have been provided my out of pocket cost for my medication and approve the pharmacy to charge the amount to my credit card on file. Yes         Completed refill call assessment today to schedule patient's medication shipment from the [facility] and Home Delivery Pharmacy [phone]).  All relevant notes have been reviewed.       Confirmed patient received a Conservation officer, historic buildings and a Surveyor, mining with first shipment. The patient will receive a drug information handout for each medication shipped and additional FDA Medication Guides as required.         REFERRAL TO PHARMACIST     Referral to the pharmacist: Not needed      [facility]     Shipping address confirmed in Epic.     Delivery Scheduled: Yes, Expected medication delivery date: 12/25/23.     Medication will be delivered via UPS to the temporary address in Epic WAM.    Riesa Chary, PharmD   [facility] Specialty and Home Delivery Pharmacy Specialty Pharmacist

## 2023-12-14 NOTE — Unmapped (Signed)
 Refill request for Mycophenolate    Last clinic visit 08-12-23    Labs done 08-28-23    Recall in [place] for pt to schedule her follow-up appointment    Will authorize refill at this time

## 2024-01-21 NOTE — Unmapped (Signed)
 [facility] Specialty and Home Delivery Pharmacy Refill Coordination Note    Specialty Medication(s) to be Shipped:   Transplant: mycophenolate  mofetil 500mg     Other medication(s) to be shipped: No additional medications requested for fill at this time     Margaret Berger, DOB: 11/03/1960  Phone: There are no phone numbers on file.      All above HIPAA information was verified with patient.     Was a Nurse, learning disability used for this call? No    Completed refill call assessment today to schedule patient's medication shipment from the [facility] and Home Delivery Pharmacy  [phone]).  All relevant notes have been reviewed.     Specialty medication(s) and dose(s) confirmed: Regimen is correct and unchanged.   Changes to medications: [name] reports no changes at this time.  Changes to insurance: No  New side effects reported not previously addressed with a pharmacist or physician: None reported  Questions for the pharmacist: No    Confirmed patient received a Conservation officer, historic buildings and a Surveyor, mining with first shipment. The patient will receive a drug information handout for each medication shipped and additional FDA Medication Guides as required.       DISEASE/MEDICATION-SPECIFIC INFORMATION        N/A    SPECIALTY MEDICATION ADHERENCE     Medication Adherence    Patient reported X missed doses in the last month: 0  Specialty Medication: mycophenolate  500 mg tablet (CELLCEPT )  Patient is on additional specialty medications: No              Were doses missed due to medication being on hold? No     mycophenolate  500 mg tablet (CELLCEPT ): 14 days of medicine on hand       REFERRAL TO PHARMACIST     Referral to the pharmacist: Not needed      [facility]     Shipping address confirmed in Epic.     Cost and Payment: Patient has a copay of $25. They are aware and have authorized the pharmacy to charge the credit card on file.    Delivery Scheduled: Yes, Expected medication delivery date: 01/29/2024.     Medication will be delivered via UPS to the temporary address in Epic WAM.    [name]   [facility] Special[facility] and Home Delivery Pharmacy  Specialty Technician

## 2024-01-28 MED FILL — MYCOPHENOLATE MOFETIL 500 MG TABLET: ORAL | 30 days supply | Qty: 120 | Fill #1

## 2024-02-08 ENCOUNTER — Ambulatory Visit: Admitting: Dermatology

## 2024-02-08 ENCOUNTER — Encounter: Payer: Self-pay | Admitting: Dermatology

## 2024-02-08 DIAGNOSIS — Z7189 Other specified counseling: Secondary | ICD-10-CM

## 2024-02-08 DIAGNOSIS — L738 Other specified follicular disorders: Secondary | ICD-10-CM

## 2024-02-08 DIAGNOSIS — L719 Rosacea, unspecified: Secondary | ICD-10-CM

## 2024-02-08 DIAGNOSIS — I781 Nevus, non-neoplastic: Secondary | ICD-10-CM

## 2024-02-08 DIAGNOSIS — L814 Other melanin hyperpigmentation: Secondary | ICD-10-CM | POA: Diagnosis not present

## 2024-02-08 MED ORDER — DOXYCYCLINE MONOHYDRATE 100 MG PO CAPS: 100.0000 mg | ORAL_CAPSULE | Freq: Every day | ORAL | 1 refills | Status: DC

## 2024-02-08 MED ORDER — METRONIDAZOLE 0.75 % EX CREA: TOPICAL_CREAM | Freq: Two times a day (BID) | CUTANEOUS | 3 refills | Status: AC

## 2024-02-08 NOTE — Progress Notes (Signed)
 Follow-Up Visit   Subjective  Jessica Zuniga is a 63 y.o. female who presents for the following: Face breaking out ~ 59m comes and goes, no treatment, check spot R cheek >1 yr, may be getting larger   The following portions of the chart were reviewed this encounter and updated as appropriate: medications, allergies, medical history  Review of Systems:  No other skin or systemic complaints except as noted in HPI or Assessment and Plan.  Objective  Well appearing patient in no apparent distress; mood and affect are within normal limits.   A focused examination was performed of the following areas: face  Relevant exam findings are noted in the Assessment and Plan.  R malar cheek    Assessment & Plan   ROSACEA face Exam Mid face erythema with telangiectasias +/- scattered inflammatory papules R>L  Chronic and persistent condition with duration or expected duration over one year. Condition is bothersome/symptomatic for patient. Currently flared.   Rosacea is a chronic progressive skin condition usually affecting the face of adults, causing redness and/or acne bumps. It is treatable but not curable. It sometimes affects the eyes (ocular rosacea) as well. It may respond to topical and/or systemic medication and can flare with stress, sun exposure, alcohol, exercise, topical steroids (including hydrocortisone /cortisone 10) and some foods.  Daily application of broad spectrum spf 30+ sunscreen to face is recommended to reduce flares.   Treatment Plan Start Metronidazole  0.75% cr bid to face Start Doxycycline  100mg  1 po qd with food and drink Start SPF 30 qAM Discussed BBL Counseling for BBL / IPL / Laser and Coordination of Care Discussed the treatment option of Broad Band Light (BBL) /Intense Pulsed Light (IPL)/ Laser for skin discoloration, including brown spots and redness.  Typically we recommend at least 1-3 treatment sessions about 5-8 weeks apart for best results.  Cannot  have tanned skin when BBL performed, and regular use of sunscreen/photoprotection is advised after the procedure to help maintain results. The patient's condition may also require maintenance treatments in the future.  The fee for BBL / laser treatments is $350 per treatment session for the whole face.  A fee can be quoted for other parts of the body.  Insurance typically does not pay for BBL/laser treatments and therefore the fee is an out-of-pocket cost. Recommend prophylactic valtrex treatment. Once scheduled for procedure, will send Rx in prior to patient's appointment.    Doxycycline  should be taken with food to prevent nausea. Do not lay down for 30 minutes after taking. Be cautious with sun exposure and use good sun protection while on this medication. Pregnant women should not take this medication.    SEBACEOUS HYPERPLASIA vs TELANGIECTASIA vs OTHER R malar cheek Exam: 4.89mm pink macule R malar cheek, see photo  Treatment Plan: Recheck on f/u  LENTIGINES Exam: scattered tan macules Due to sun exposure Treatment Plan: Benign-appearing, observe. Recommend daily broad spectrum sunscreen SPF 30+ to sun-exposed areas, reapply every 2 hours as needed.  Call for any changes Recommend BBL Counseling for BBL / IPL / Laser and Coordination of Care Discussed the treatment option of Broad Band Light (BBL) /Intense Pulsed Light (IPL)/ Laser for skin discoloration, including brown spots and redness.  Typically we recommend at least 1-3 treatment sessions about 5-8 weeks apart for best results.  Cannot have tanned skin when BBL performed, and regular use of sunscreen/photoprotection is advised after the procedure to help maintain results. The patient's condition may also require maintenance treatments in the  future.  The fee for BBL / laser treatments is $350 per treatment session for the whole face.  A fee can be quoted for other parts of the body.  Insurance typically does not pay for BBL/laser  treatments and therefore the fee is an out-of-pocket cost. Recommend prophylactic valtrex treatment. Once scheduled for procedure, will send Rx in prior to patient's appointment.  Patient declined Valtrex    Return for as scheduled for [facility].  I, Grayce Saunas, RMA, am acting as scribe for Rexene Rattler, MD .   Documentation: I have reviewed the above documentation for accuracy and completeness, and I agree with the above.  Rexene Rattler, MD

## 2024-02-08 NOTE — Patient Instructions (Addendum)
 Counseling for BBL / IPL / Laser and Coordination of Care Discussed the treatment option of Broad Band Light (BBL) /Intense Pulsed Light (IPL)/ Laser for skin discoloration, including brown spots and redness.  Typically we recommend at least 1-3 treatment sessions about 5-8 weeks apart for best results.  Cannot have tanned skin when BBL performed, and regular use of sunscreen/photoprotection is advised after the procedure to help maintain results. The patient's condition may also require "maintenance treatments" in the future.  The fee for BBL / laser treatments is $350 per treatment session for the whole face.  A fee can be quoted for other parts of the body.  Insurance typically does not pay for BBL/laser treatments and therefore the fee is an out-of-pocket cost. Recommend prophylactic valtrex treatment. Once scheduled for procedure, will send Rx in prior to patient's appointment.     Due to recent changes in healthcare laws, you may see results of your pathology and/or laboratory studies on MyChart before the doctors have had a chance to review them. We understand that in some cases there may be results that are confusing or concerning to you. Please understand that not all results are received at the same time and often the doctors may need to interpret multiple results in order to provide you with the best plan of care or course of treatment. Therefore, we ask that you please give [place] 2 business days to thoroughly review all your results before contacting the office for clarification. Should we see a critical lab result, you will be contacted sooner.   If You Need Anything After Your Visit  If you have any questions or concerns for your doctor, please call our main line at [phone] and press option 4 to reach your doctor's medical assistant. If no one answers, please leave a voicemail as directed and we will return your call as soon as possible. Messages left after 4 pm will be answered the  following business day.   You may also send [place] a message via MyChart. We typically respond to MyChart messages within 1-2 business days.  For prescription refills, please ask your pharmacy to contact our office. Our fax number is [phone].  If you have an urgent issue when the clinic is closed that cannot wait until the next business day, you can page your doctor at the number below.    Please note that while we do our best to be available for urgent issues outside of office hours, we are not available 24/7.   If you have an urgent issue and are unable to reach [place], you may choose to seek medical care at your doctor's office, retail clinic, urgent care center, or emergency room.  If you have a medical emergency, please immediately call 911 or go to the emergency department.  Pager Numbers  - Dr. Gwen Pounds: [phone]  - Dr. Roseanne Reno: [phone]  - Dr. Katrinka Blazing: [phone]   In the event of inclement weather, please call our main line at [phone] for an update on the status of any delays or closures.  Dermatology Medication Tips: Please keep the boxes that topical medications come in in order to help keep track of the instructions about where and how to use these. Pharmacies typically print the medication instructions only on the boxes and not directly on the medication tubes.   If your medication is too expensive, please contact our office at [phone] option 4 or send [place] a message through MyChart.   We are unable to tell what  your co-pay for medications will be in advance as this is different depending on your insurance coverage. However, we may be able to find a substitute medication at lower cost or fill out paperwork to get insurance to cover a needed medication.   If a prior authorization is required to get your medication covered by your insurance company, please allow [place] 1-2 business days to complete this process.  Drug prices often vary depending on where the  prescription is filled and some pharmacies may offer cheaper prices.  The website www.goodrx.com contains coupons for medications through different pharmacies. The prices here do not account for what the cost may be with help from insurance (it may be cheaper with your insurance), but the website can give you the price if you did not use any insurance.  - You can print the associated coupon and take it with your prescription to the pharmacy.  - You may also stop by our office during regular business hours and pick up a GoodRx coupon card.  - If you need your prescription sent electronically to a different pharmacy, notify our office through [facility] or by phone at [phone] option 4.     Si Usted Necesita Algo Despus de Su Visita  Tambin puede enviarnos un mensaje a travs de Clinical cytogeneticist. Por lo general respondemos a los mensajes de MyChart en el transcurso de 1 a 2 das hbiles.  Para renovar recetas, por favor pida a su farmacia que se ponga en contacto con nuestra oficina. Annie Sable de fax es [place] [phone].  Si tiene un asunto urgente cuando la clnica est cerrada y que no puede esperar hasta el siguiente da hbil, puede llamar/localizar a su doctor(a) al nmero que aparece a continuacin.   Por favor, tenga en cuenta que aunque hacemos todo lo posible para estar disponibles para asuntos urgentes fuera del horario de [place], no estamos disponibles las 24 horas del da, los 7 [address]  [address] de la [place].   Si tiene un problema urgente y no puede comunicarse con nosotros, puede optar por buscar atencin mdica  en el consultorio de su doctor(a), en una clnica privada, en un centro de atencin urgente o en una sala de emergencias.  Si tiene Engineer, drilling, por favor llame inmediatamente al 911 o vaya a la sala de emergencias.  Nmeros de bper  - Dr. Gwen Pounds: [phone]  - Dra. Roseanne Reno: [phone]  - Dr. Katrinka Blazing: [phone]   En caso de inclemencias del  tiempo, por favor llame a Lacy Duverney principal al [phone] para una actualizacin sobre el [place] de cualquier retraso o cierre.  Consejos para la medicacin en dermatologa: Por favor, guarde las cajas en las que vienen los medicamentos de uso tpico para ayudarle a seguir las instrucciones sobre dnde y cmo usarlos. Las farmacias generalmente imprimen las instrucciones del medicamento slo en las cajas y no directamente en los tubos del [place].   Si su medicamento es muy caro, por favor, pngase en contacto con Rolm Gala llamando al [phone] y presione la opcin 4 o envenos un mensaje a travs de Clinical cytogeneticist.   No podemos decirle cul ser su copago por los medicamentos por adelantado ya que esto es diferente dependiendo de la cobertura de su seguro. Sin embargo, es posible que podamos encontrar un medicamento sustituto a Audiological scientist un formulario para que el seguro cubra el medicamento que se considera necesario.   Si se requiere una autorizacin previa para que su compaa de seguros [place] su  medicamento, por favor permtanos de 1 a 2 das hbiles para completar [address]  Los precios de los medicamentos varan con frecuencia dependiendo del Environmental consultant de dnde se surte la receta y alguna farmacias pueden ofrecer precios ms baratos.  El sitio web www.goodrx.com tiene cupones para medicamentos de Health and safety inspector. Los precios aqu no tienen en cuenta lo que podra costar con la ayuda del seguro (puede ser ms barato con su seguro), pero el sitio web puede darle el precio si no utiliz Tourist information centre manager.  - Puede imprimir el cupn correspondiente y llevarlo con su receta a la farmacia.  - Tambin puede pasar por nuestra oficina durante el horario de atencin regular y Education officer, museum una tarjeta de cupones de GoodRx.  - Si necesita que su receta se enve electrnicamente a una farmacia diferente, informe a nuestra oficina a travs de MyChart de [place] o por telfono  llamando al [phone] y presione la opcin 4.

## 2024-02-16 NOTE — Unmapped (Signed)
 [facility] Specialty and Home Delivery Pharmacy Refill Coordination Note    Margaret Berger, DOB: [birth year]  Phone: There are no phone numbers on file.      All above HIPAA information was verified with patient.         02/15/2024    10:02 AM   Specialty Rx Medication Refill Questionnaire   Which Medications would you like refilled and shipped? CellCept  14 days left   Please list all current allergies: Azathioprine   Have you missed any doses in the last 30 days? No   Have you had any changes to your medication(s) since your last refill? No   How much of each medication do you have remaining at home? (eg. number of tablets, injections, etc.) 14 days   Have you experienced any side effects in the last 30 days? No   Please enter the full address (street address, city, state, zip code) where you would like your medication(s) to be delivered to. [address], [place], [place] [address]   Please specify on which day you would like your medication(s) to arrive. Note: if you need your medication(s) within 3 days, please call the pharmacy to schedule your order at [phone]  02/26/2024   Has your insurance changed since your last refill? No   Would you like a pharmacist to call you to discuss your medication(s)? No   Do you require a signature for your package? (Note: if we are billing Medicare Part B or your order contains a controlled substance, we will require a signature) No   I have been provided my out of pocket cost for my medication and approve the pharmacy to charge the amount to my credit card on file. Yes         Completed refill call assessment today to schedule patient's medication shipment from the [facility] [phone]).  All relevant notes have been reviewed.       Confirmed patient received a Conservation officer, historic buildings and a Surveyor, mining with first shipment. The patient will receive a drug information handout for each medication shipped and additional FDA Medication Guides as required.         REFERRAL TO PHARMACIST     Referral to the pharmacist: Not needed      [facility]     Shipping address confirmed in Epic.     Delivery Scheduled: Yes, Expected medication delivery date: 02/23/2024.     Medication will be delivered via UPS to the prescription address in Epic WAM.    [name]   [facility] Specialty and Home Delivery Pharmacy Specialty Technician

## 2024-02-17 ENCOUNTER — Ambulatory Visit: Admitting: Dermatology

## 2024-02-17 ENCOUNTER — Ambulatory Visit: Admit: 2024-02-17 | Discharge: 2024-02-18 | Payer: MEDICARE

## 2024-02-17 ENCOUNTER — Encounter: Admit: 2024-02-17 | Discharge: 2024-02-18 | Payer: MEDICARE | Attending: Gastroenterology | Primary: Gastroenterology

## 2024-02-17 ENCOUNTER — Ambulatory Visit: Admit: 2024-02-17 | Discharge: 2024-02-18 | Payer: MEDICARE | Attending: Gastroenterology | Primary: Gastroenterology

## 2024-02-17 DIAGNOSIS — K754 Autoimmune hepatitis: Secondary | ICD-10-CM | POA: Diagnosis not present

## 2024-02-17 LAB — COMPREHENSIVE METABOLIC PANEL
ALBUMIN: 4.2 g/dL (ref 3.4–5.0)
ALKALINE PHOSPHATASE: 55 U/L (ref 46–116)
ALT (SGPT): 17 U/L (ref 10–49)
ANION GAP: 10 mmol/L (ref 5–14)
AST (SGOT): 21 U/L (ref <=34)
BILIRUBIN TOTAL: 0.7 mg/dL (ref 0.3–1.2)
BLOOD UREA NITROGEN: 11 mg/dL (ref 9–23)
BUN / CREAT RATIO: 18
CALCIUM: 10 mg/dL (ref 8.7–10.4)
CHLORIDE: 105 mmol/L (ref 98–107)
CO2: 26 mmol/L (ref 20.0–31.0)
CREATININE: 0.6 mg/dL (ref 0.55–1.02)
EGFR CKD-EPI (2021) FEMALE: >90 mL/min/1.73m2 (ref >=60)
GLUCOSE RANDOM: 112 mg/dL (ref 70–179)
POTASSIUM: 4.7 mmol/L (ref 3.4–4.8)
PROTEIN TOTAL: 7.2 g/dL (ref 5.7–8.2)
SODIUM: 141 mmol/L (ref 135–145)

## 2024-02-17 LAB — CBC W/ AUTO DIFF
BASOPHILS ABSOLUTE COUNT: 0 10*9/L (ref 0.0–0.1)
BASOPHILS RELATIVE PERCENT: 0.4 %
EOSINOPHILS ABSOLUTE COUNT: 0.1 10*9/L (ref 0.0–0.5)
EOSINOPHILS RELATIVE PERCENT: 1.8 %
HEMATOCRIT: 39.5 % (ref 34.0–44.0)
HEMOGLOBIN: 13.3 g/dL (ref 11.3–14.9)
LYMPHOCYTES ABSOLUTE COUNT: 1.4 10*9/L (ref 1.1–3.6)
LYMPHOCYTES RELATIVE PERCENT: 33.5 %
MEAN CORPUSCULAR HEMOGLOBIN CONC: 33.7 g/dL (ref 32.0–36.0)
MEAN CORPUSCULAR HEMOGLOBIN: 31.6 pg (ref 25.9–32.4)
MEAN CORPUSCULAR VOLUME: 93.9 fL (ref 77.6–95.7)
MEAN PLATELET VOLUME: 8 fL (ref 6.8–10.7)
MONOCYTES ABSOLUTE COUNT: 0.3 10*9/L (ref 0.3–0.8)
MONOCYTES RELATIVE PERCENT: 6.8 %
NEUTROPHILS ABSOLUTE COUNT: 2.4 10*9/L (ref 1.8–7.8)
NEUTROPHILS RELATIVE PERCENT: 57.5 %
PLATELET COUNT: 184 10*9/L (ref 150–450)
RED BLOOD CELL COUNT: 4.21 10*12/L (ref 3.95–5.13)
RED CELL DISTRIBUTION WIDTH: 13.2 % (ref 12.2–15.2)
WBC ADJUSTED: 4.2 10*9/L (ref 3.6–11.2)

## 2024-02-17 LAB — AFP TUMOR MARKER: AFP-TUMOR MARKER: 5 ng/mL (ref <=8)

## 2024-02-17 LAB — PROTIME-INR
INR: 0.89
PROTIME: 10.2 s (ref 9.9–12.6)

## 2024-02-17 NOTE — Unmapped (Signed)
 [facility]    Ozell MICAEL Ash, M.D.  Professor of Medicine  Division of GI and Hepatology  [employer]   at [facility]    [phone]      Elouise CHRISTELLA Sharps    Referring MD: Rogelia Copping, MD      Chief complaint: Office follow-up for elevation of liver enzymes. Hospitalization (08/2016) for acute hepatitis of uncertain etiology, presumed autoimmune hepatitis treated with prednisone  and mycophenylate. DRUG FEVER ASSOCIATED WITH AZATHIOPRINE. STEROIDS STOPPED 07/2017. CONTINUES ON CELLCEPT  1 GRAM BID FOR IMMUNOSUPPRESSION    Interval history: Patient continues to feel great. Traveling between [facility] and [place]. She denies fever, cough, sob, N/V, abdominal pain, chest pain, or skin rash.  She has been maintained on mycophenylate 1gram bid. No new health related concerns. Son getting married in September 2025.     Present illness at presentation:  Patient is a 63 y.o. Caucasian female who was hospitalized at [facility] between approximately February 12 -  August 29, 2016 with acute hepatitis. The patient had about 10 weeks of abnormal liver tests documented by her primary physician and her primary gastroenterologist. This was preceded by a short flu-like illness characterized by fatigue, muscle aches, and low-grade fever. All of the symptoms have completely remained resolved since her  Visit 09/10/2016. However she was left with elevated liver tests and had an extensive workup as an outpatient and subsequently as an inpatient at [facility] to further define the etiology of her liver disease.  Additional details of her history and prior outpatient evaluation as well as her inpatient evaluation are available from our initial consultation note and follow-up notes while she was hospitalized.       After review of the liver biopsy and all serologic data thus far which has been unrevealing, it was felt that a therapeutic trial of prednisone  for presumed seronegative autoimmune hepatitis was warranted. Prednisone   40 mg of daily started on 08/29/16. She had a good biochemical response with marked improvement in ALT, INR, including a slow but steady decrease in Total/direct bilirubin.     Patient had an extensive serological evaluation which was not revealing:  1. Antinuclear antibody and anti-smooth muscle antibody were negative. Immunoglobulin G was not elevated (1061)  2. Viral serologies including acute hepatitis A, hepatitis B, hepatitis C, HCV RNA, EBV PCR, CMV PCR, HHV-6 were all negative.  3. Liver ultrasound demonstrated patent vasculature with a nodular-appearing heterogeneous liver.  4. Percutaneous liver biopsy 08/2016 at [facility]:    A: Liver, core biopsy   - Severe active hepatitis with confluent areas of periportal hepatocellular necrosis (approximately 10-20% of sampled parenchyma) (see comment)   - Trichrome stain demonstrates at least periportal fibrosis and is suspicious for bridging fibrosis     Potential etiologies to consider include, but are not limited to, infection, drug/toxin-induced liver injury, seronegative autoimmune hepatitis, and metabolic disorders. The trichrome stain findings suggest that this process is at least subacute in duration.      Liver biopsy slides were personally reviewed by Dr. Ozell Ash  with the pathologist.  No viral inclusions were noted and only few plasma cells were visualized in the liver biopsy which was an adequate specimen. No cytopathic effect. EBV immunostains were negative. Fe in hepatocytes only grade 1/4    5.  Evaluation for Zoster: Dr. Ash received phone call from patient's primary GI MD that patient's Zoster IgM from 09/05/2016 was positive with titer 1.17 approximately one week after starting prednisone . Review of her record though  revealed this test to have been negative on 08/29/2016.  Dr. Ozell Ash discussed these findings with Dr. Jenkins Marten and the decision was made to proceed with patient starting Valtrex 500 mg bid.  She was seen by Dr. 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   Recommendations from ID: Continue valacyclovir 500 mg bid.  Advised readdressing stopping Valtrex if her repeat IgM is negative or once she is on <15 mg prednisone  daily.  Valtrex was discontinued last week.  Bactrim prophylaxis while prednisone  >15 mg q 24. 1 DS tablet M/W/F. The following vaccines were recommended for her to obtain under the care of her PCP: Twinrix, PCV13, and PPSV23. She should have these performed by her PCP when prednisone  dose is  <20 mg daily.     Immunosuppressive history:  Patient was started on azathioprine for steroid sparing benefits in early April.  After 2 weeks patient developed fevers. ID workup negative during hospitalization.  Azathioprine was held during that hospitalization and fevers resolved. When she restarted azathioprine as outpatient, immediately had high fevers and shaking chills.  This rechallenge was highly suggestive of drug fever from azathioprine.    On 5/12 patient was started on CellCept  500mg  bid and continued on prednisone  15 mg daily.  On 12/03/16, liver enzymes remained elevated. Cellcept  was increased to 1000mg  BID on 12/04/16. Prednisone  tapered to off as of 07/2017.     10 sys ROS otherwise negative.     Past medical history:  Patient has generally been healthy until the current hepatitis began in December 2017.  1. No history of diabetes, coronary artery disease, hypertension, or lung disease.  2. No history of other autoimmune systemic diseases  3. Excision of incidental benign adnexal cyst  3. FIBROSCAN 11/05/16: 17.7 kps c/w stage F4 (Patient ate breakfast 3 hours prior).  4. MRI 10/2017: HEPATOBILIARY: Nodular liver contour compatible with history of cirrhosis and similar to prior. Again seen is a geographic region of T1 hypointensity and corresponding slight T2 hyperintensity in the posterior right hepatic lobe which is overall similar in extent when compared to prior imaging. This region demonstrates progressive postcontrast enhancement as before. Overall similar degree of capsular retraction most pronounced along the right anterior margin of the liver (13:29). No discrete early arterially enhancing or washout liver lesion identified. No biliary ductal dilatation. Similar appearance and position of the borderline hydropic gallbladder.  PANCREAS: Pancreas divisum. No ductal dilatation.  5. MRI 06/2020 and 12/2020: Morphologic features consistent with history of cirrhosis. No sequela of portal hypertension.No MR evidence of HCC.  Similar appearing probable confluent fibrosis in the right hepatic lobe.   6. HCC Surveillance: MRI 07/2023: No liver masses. Unchanged evidence of cirrhosis and confluent fibrosis with evidence of portal HTN.   MRI 02/18/2024: Unchanged evidence of cirrhosis, confluent fibrosis, portal HTN.  7. EGD 06/2021: No varices      Allergies   Allergen Reactions    Azathioprine Other (See Comments)     Fever  Other reaction(s): Other (See Comments)  Fever       Current Outpatient Medications   Medication Sig Dispense Refill    mycophenolate  (CELLCEPT ) 500 mg tablet Take 2 tablets (1,000 mg total) by mouth 2 (two) times a day with meals. 360 tablet 1     No current facility-administered medications for this visit.     Social history: The patient is married. She works as a Materials engineer. They have traveled  extensively.   She does not drink any alcohol. She does not smoke cigarettes.    Family history: Negative for liver disease or liver cancer. Negative for autoimmune systemic diseases.    BP 130/80  - Pulse 64  - Temp 36.6 ??C (97.8 ??F) (Temporal)  - Ht 167.6 cm (5' 5.98)  - Wt 68.4 kg (150 lb 12.8 oz)  - SpO2 100%  - BMI 24.35 kg/m??     Pleasant individual in NAD    HEENT: Sclera are anicteric, no temporal muscle loss  NECK: No thyromegaly or lymphadenopathy, No carotid bruits  Chest: Clear to auscultation and percussion  Heart: S1, S2, RR, No murmurs  Abdomen: Soft, non-tender, non-distended, no hepatosplenomegaly, no masses appreciated, no ascites  Skin: No spider angiomata, No rashes  Extremities: Without pedal edema, no palmar erythema. Left decubital fossa swollen, mildly tender. No erythema.  Warm extremities.   Neuro: Grossly intact, No focal deficits    Results for orders placed or performed in visit on 02/17/24   AFP tumor marker   Result Value Ref Range    AFP-Tumor Marker 5 <=8 ng/mL   PT-INR   Result Value Ref Range    PT 10.2 9.9 - 12.6 sec    INR 0.89    Comprehensive Metabolic Panel   Result Value Ref Range    Sodium 141 135 - 145 mmol/L    Potassium 4.7 3.4 - 4.8 mmol/L    Chloride 105 98 - 107 mmol/L    CO2 26.0 20.0 - 31.0 mmol/L    Anion Gap 10 5 - 14 mmol/L    BUN 11 9 - 23 mg/dL    Creatinine 9.39 9.44 - 1.02 mg/dL    BUN/Creatinine Ratio 18     eGFR CKD-EPI (2021) Female >90 >=60 mL/min/1.13m2    Glucose 112 70 - 179 mg/dL    Calcium 89.9 8.7 - 89.5 mg/dL    Albumin 4.2 3.4 - 5.0 g/dL    Total Protein 7.2 5.7 - 8.2 g/dL    Total Bilirubin 0.7 0.3 - 1.2 mg/dL    AST 21 <=65 U/L    ALT 17 10 - 49 U/L    Alkaline Phosphatase 55 46 - 116 U/L   CBC w/ Differential   Result Value Ref Range    WBC 4.2 3.6 - 11.2 10*9/L    RBC 4.21 3.95 - 5.13 10*12/L    HGB 13.3 11.3 - 14.9 g/dL    HCT 60.4 65.9 - 55.9 %    MCV 93.9 77.6 - 95.7 fL    MCH 31.6 25.9 - 32.4 pg    MCHC 33.7 32.0 - 36.0 g/dL    RDW 86.7 87.7 - 84.7 %    MPV 8.0 6.8 - 10.7 fL    Platelet 184 150 - 450 10*9/L    Neutrophils % 57.5 %    Lymphocytes % 33.5 %    Monocytes % 6.8 %    Eosinophils % 1.8 %    Basophils % 0.4 %    Absolute Neutrophils 2.4 1.8 - 7.8 10*9/L    Absolute Lymphocytes 1.4 1.1 - 3.6 10*9/L    Absolute Monocytes 0.3 0.3 - 0.8 10*9/L    Absolute Eosinophils 0.1 0.0 - 0.5 10*9/L    Absolute Basophils 0.0 0.0 - 0.1 10*9/L         Impression:  1. Seronegative autoimmune hepatitis based on response to immunosuppressive medication. The patient presented with severe hepatitis as evidenced by hyperbilirubinemia, elevation of  her INR, decreased albumin, and results from her liver biopsy. Patient was started on a therapeutic trial of prednisone  for the reasons discussed above. After only 4 days of immunosuppression, her liver enzymes (ALT)  that had been regularly elevated above 1000 decreased substantially.  This was highly suggestive of a positive effect of the prednisone  and was c/w  presumed ANA-negative/SMA-negative autoimmune hepatitis.Labs showed continued normalization of bilirubin and INR, although she remained with mildly elevated ALT in the 40-45 range. CellCept  was added on 5/12 and increased to 1000mg  BID on 12/04/16.Steroids tapered to off as of 07/2017.    We will maintain current level of CellCept .  ALT normal.  Patient feels completely well and labs stable. Remains in remission on CellCept .     2. Cirrhosis, well compensated:  FIBROSCAN suggestive of cirrhosis and MRI suggestive of nodular contour with focal fibrosis.  Liver biopsy during acute episode did demonstrate possible bridging fibrosis. Inflammation now resolved.    3. HCC surveillance: 07/2023 an 02/2024: Cirrhosis and confluence fibrosis, unchanged. No liver masses.       4. Evaluate portal HTN: MRI from 6/22 showed some changes c/w increasing portal HTN despite remission of autoimmune hepatitis. Cirrhotic stigmata with evidence of increased portal pressures including recannulized umbilical vein and small caliber upper abdominal varices. EGD 06/2021: No varices. Mild esophagitis.      5. Bone density: Monitored by PCP.       RTC in 6 months with MRI same day. Labs at 3 months interval while on immunosuppression.  Labs performed at [facility].    Ozell MICAEL Ash, M.D., FAASLD  Professor of Medicine  Division of GI and Hepatology  [employer]  at [facility]    [phone]

## 2024-02-17 NOTE — Unmapped (Signed)
 Autoimmune hepatitis, in remission on CellCept . Continue this medication. Labs today. MRI scheduled for tomorrow. Follow-up with primary physician to monitor bone density. Return to office in 6 months with MRI same day (ordered). Please call radiology to schedule the imaging of your liver [phone]

## 2024-02-18 ENCOUNTER — Inpatient Hospital Stay: Admit: 2024-02-18 | Discharge: 2024-02-18 | Payer: MEDICARE

## 2024-02-18 DIAGNOSIS — K766 Portal hypertension: Secondary | ICD-10-CM | POA: Diagnosis not present

## 2024-02-18 DIAGNOSIS — K754 Autoimmune hepatitis: Secondary | ICD-10-CM | POA: Diagnosis not present

## 2024-02-18 DIAGNOSIS — K7400 Hepatic fibrosis, unspecified: Secondary | ICD-10-CM | POA: Diagnosis not present

## 2024-02-18 DIAGNOSIS — K7460 Unspecified cirrhosis of liver: Secondary | ICD-10-CM | POA: Diagnosis not present

## 2024-02-18 MED ADMIN — gadopiclenol (ELUCIREM,VUEWAY) injection 7 mL: 7 mL | INTRAVENOUS | @ 14:00:00 | Stop: 2024-02-18

## 2024-02-25 MED FILL — MYCOPHENOLATE MOFETIL 500 MG TABLET: ORAL | 30 days supply | Qty: 120 | Fill #2

## 2024-02-29 ENCOUNTER — Ambulatory Visit (INDEPENDENT_AMBULATORY_CARE_PROVIDER_SITE_OTHER): Payer: BC Managed Care – PPO | Admitting: Dermatology

## 2024-02-29 DIAGNOSIS — W908XXA Exposure to other nonionizing radiation, initial encounter: Secondary | ICD-10-CM | POA: Diagnosis not present

## 2024-02-29 DIAGNOSIS — L814 Other melanin hyperpigmentation: Secondary | ICD-10-CM

## 2024-02-29 DIAGNOSIS — L820 Inflamed seborrheic keratosis: Secondary | ICD-10-CM | POA: Diagnosis not present

## 2024-02-29 DIAGNOSIS — L219 Seborrheic dermatitis, unspecified: Secondary | ICD-10-CM

## 2024-02-29 DIAGNOSIS — L719 Rosacea, unspecified: Secondary | ICD-10-CM

## 2024-02-29 DIAGNOSIS — L578 Other skin changes due to chronic exposure to nonionizing radiation: Secondary | ICD-10-CM | POA: Diagnosis not present

## 2024-02-29 DIAGNOSIS — L649 Androgenic alopecia, unspecified: Secondary | ICD-10-CM

## 2024-02-29 DIAGNOSIS — D1801 Hemangioma of skin and subcutaneous tissue: Secondary | ICD-10-CM

## 2024-02-29 DIAGNOSIS — Z1283 Encounter for screening for malignant neoplasm of skin: Secondary | ICD-10-CM | POA: Diagnosis not present

## 2024-02-29 DIAGNOSIS — D229 Melanocytic nevi, unspecified: Secondary | ICD-10-CM

## 2024-02-29 DIAGNOSIS — Z85828 Personal history of other malignant neoplasm of skin: Secondary | ICD-10-CM

## 2024-02-29 DIAGNOSIS — L650 Telogen effluvium: Secondary | ICD-10-CM

## 2024-02-29 MED ORDER — MINOXIDIL 2.5 MG PO TABS: 2.5000 mg | ORAL_TABLET | Freq: Every day | ORAL | 2 refills | Status: DC

## 2024-02-29 MED ORDER — KETOCONAZOLE 2 % EX SHAM: MEDICATED_SHAMPOO | CUTANEOUS | 11 refills | Status: AC

## 2024-02-29 NOTE — Progress Notes (Signed)
 Follow-Up Visit   Subjective  Jessica Zuniga is a 63 y.o. female who presents for the following: Skin Cancer Screening and Full Body Skin Exam Hx of bcc  F/up rosacea - started using metronidazole  cream twice daily and doxycycline  100 mg daily last visit 02/08/24. Reports has improved  Reports an itchy scalp and increased shedding hair in last 6 months. May be a little thinner in her ponytail. She denies any illness, hospitalizations such as surgery, large weight loss, stress/depression, new meds in last 6 months. Reports she does have history of autoimmune hepatitis which is in remission. Takes Cellcept.   The patient presents for Total-Body Skin Exam (TBSE) for skin cancer screening and mole check. The patient has spots, moles and lesions to be evaluated, some may be new or changing and the patient may have concern these could be cancer.    The following portions of the chart were reviewed this encounter and updated as appropriate: medications, allergies, medical history  Review of Systems:  No other skin or systemic complaints except as noted in HPI or Assessment and Plan.  Objective  Well appearing patient in no apparent distress; mood and affect are within normal limits.  A full examination was performed including scalp, head, eyes, ears, nose, lips, neck, chest, axillae, abdomen, back, buttocks, bilateral upper extremities, bilateral lower extremities, hands, feet, fingers, toes, fingernails, and toenails. All findings within normal limits unless otherwise noted below.   Relevant physical exam findings are noted in the Assessment and Plan.       right medial shoulder x 1, right inframammary x 1 (2) Erythematous stuck-on, waxy papule or plaque  Assessment & Plan   SKIN CANCER SCREENING PERFORMED TODAY.  ACTINIC DAMAGE - Chronic condition, secondary to cumulative UV/sun exposure - diffuse scaly erythematous macules with underlying dyspigmentation - Recommend daily broad  spectrum sunscreen SPF 30+ to sun-exposed areas, reapply every 2 hours as needed.  - Staying in the shade or wearing long sleeves, sun glasses (UVA+UVB protection) and wide brim hats (4-inch brim around the entire circumference of the hat) are also recommended for sun protection.  - Call for new or changing lesions.  LENTIGINES, SEBORRHEIC KERATOSES, HEMANGIOMAS - Benign normal skin lesions - Benign-appearing - Call for any changes  LENTIGO Exam: 3.5 mm light tan macule on the right ear helix Due to sun exposure Treatment Plan: Benign-appearing. Stable compared to previous visit. Observation.  Call clinic for new or changing moles.  Recommend daily use of broad spectrum spf 30+ sunscreen to sun-exposed areas.    MELANOCYTIC NEVI - Tan-brown and/or pink-flesh-colored symmetric macules and papules - Benign appearing on exam today - Observation - Call clinic for new or changing moles - Recommend daily use of broad spectrum spf 30+ sunscreen to sun-exposed areas.    ROSACEA face Exam Mild erythema and telangiectasias at malar cheeks 4.51mm pink macule R malar cheek, see photo, consistent with telangiectasias    Chronic and persistent condition with duration or expected duration over one year. Condition is bothersome/symptomatic for patient. Currently flared.     Rosacea is a chronic progressive skin condition usually affecting the face of adults, causing redness and/or acne bumps. It is treatable but not curable. It sometimes affects the eyes (ocular rosacea) as well. It may respond to topical and/or systemic medication and can flare with stress, sun exposure, alcohol, exercise, topical steroids (including hydrocortisone /cortisone 10) and some foods.  Daily application of broad spectrum spf 30+ sunscreen to face is recommended to reduce  flares.     Treatment Plan Continue Metronidazole  0.75% cr bid to face Continue Doxycycline  100mg  1 po qd for another month. Take with food and  drink.  Patient instructed to call if she continues to get flares after she finishes oral treatment. Will send in low dose of doxycycline  40 mg PO qd.  Continue SPF 30 qAM  Pt had BBL in [facility]     Doxycycline  should be taken with food to prevent nausea. Do not lay down for 30 minutes after taking. Be cautious with sun exposure and use good sun protection while on this medication. Pregnant women should not take this medication.    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 Short hairs at BL temporal hairline. See photos  Chronic and persistent condition with duration or expected duration over one year. Condition is bothersome/symptomatic for patient. Currently flared.   Telogen effluvium is a benign, self-limited condition causing increased hair shedding usually for several months. It does not progress to baldness, and the hair eventually grows back on its own. It can be triggered by recent illness, recent surgery, thyroid  disease, low iron stores, vitamin D  deficiency, fad diets or rapid weight loss, hormonal changes such as pregnancy or birth control pills, and some medication. Usually the hair loss starts 2-3 months after the illness or health change. Rarely, it can continue for longer than a year. Treatments options may include oral or topical Minoxidil ; Red Light scalp treatments; Biotin 2.5 mg daily and other options.  Treatment Plan: Will check thyroid  panel, vitamin D  and ferritin, pt will add these labs to her upcoming visit with PCP Ensure getting enough protein in diet BP 128/83  P 69 Start minoxidil  2.5 mg tab - take 1/2 tab by mouth daily for first month. If no side effects can increase to 1 whole tab by mouth daily.  Doses of oral minoxidil  for hair loss are considered 'low dose'. This is because the doses used for hair loss are much lower than the doses which are used for conditions such as high blood pressure (hypertension). The doses  used for hypertension are 10-40mg  per day.  Side effects are uncommon at the low doses (up to 2.5 mg/day) used to treat hair loss. Potential side effects, more commonly seen at higher doses, include: Increase in hair growth (hypertrichosis) elsewhere on face and body Temporary hair shedding upon starting medication which may last up to 4 weeks Ankle swelling, fluid retention, rapid weight gain more than 5 pounds Low blood pressure and feeling lightheaded or dizzy when standing up quickly Fast or irregular heartbeat Headaches    SEBORRHEIC DERMATITIS Exam: Mild erythema, no scale scalp, pt c/o itching  Chronic and persistent condition with duration or expected duration over one year. Condition is symptomatic/ bothersome to patient. Not currently at goal.   Seborrheic Dermatitis is a chronic persistent rash characterized by pinkness and scaling most commonly of the mid face but also can occur on the scalp (dandruff), ears; mid chest, mid back and groin.  It tends to be exacerbated by stress and cooler weather.  People who have neurologic disease may experience new onset or exacerbation of existing seborrheic dermatitis.  The condition is not curable but treatable and can be controlled.  Treatment Plan: Start ketoconazole  shampoo apply 2 to 3 times per week, massage into scalp and leave in for 3 to 5 minutes before rinsing out  Discussed topical steroid solution to use prn itch- patient will call if  she would like it sent in  HISTORY OF BASAL CELL CARCINOMA OF THE SKIN 04/29/2022 left upper arm - Mohs 06/09/22 - No evidence of recurrence today - Recommend regular full body skin exams - Recommend daily broad spectrum sunscreen SPF 30+ to sun-exposed areas, reapply every 2 hours as needed.  - Call if any new or changing lesions are noted between office visits  SEBORRHEIC DERMATITIS   Related Medications ketoconazole  (NIZORAL ) 2 % shampoo apply 2 to 3  times per week, massage into scalp  and leave in for 3 to 5 minutes before rinsing out TELOGEN EFFLUVIUM   Related Procedures Thyroid  Panel With TSH Ferritin Vitamin D , 25-hydroxy Related Medications minoxidil  (LONITEN ) 2.5 MG tablet Take 1 tablet (2.5 mg total) by mouth daily. INFLAMED SEBORRHEIC KERATOSIS (2) right medial shoulder x 1, right inframammary x 1 (2) Symptomatic, irritating, patient would like treated.  Right inframammary Isk vs skin tag  Destruction of lesion - right medial shoulder x 1, right inframammary x 1 (2)  Destruction method: cryotherapy   Informed consent: discussed and consent obtained   Lesion destroyed using liquid nitrogen: Yes   Region frozen until ice ball extended beyond lesion: Yes   Outcome: patient tolerated procedure well with no complications   Post-procedure details: wound care instructions given   Additional details:  Prior to procedure, discussed risks of blister formation, small wound, skin dyspigmentation, or rare scar following cryotherapy. Recommend Vaseline ointment to treated areas while healing.   Return for 3 month recheck scalp, rosacea, and hairloss, 1 year tbse .  I, Eleanor Blush, CMA, am acting as scribe for Rexene Rattler, MD.   Documentation: I have reviewed the above documentation for accuracy and completeness, and I agree with the above.  Rexene Rattler, MD

## 2024-02-29 NOTE — Patient Instructions (Addendum)
 For hair loss  Start minoxidil  2.5 mg tab - take 1/2 tab by mouth for first month. If do not notice any side effects can increase to 1 whole tab by mouth daily  Doses of oral minoxidil  for hair loss are considered 'low dose'. This is because the doses used for hair loss are much lower than the doses which are used for conditions such as high blood pressure (hypertension). The doses used for hypertension are 10-40mg  per day.  Side effects are uncommon at the low doses (up to 2.5 mg/day) used to treat hair loss. Potential side effects, more commonly seen at higher doses, include: Increase in hair growth (hypertrichosis) elsewhere on face and body Temporary hair shedding upon starting medication which may last up to 4 weeks Ankle swelling, fluid retention, rapid weight gain more than 5 pounds Low blood pressure and feeling lightheaded or dizzy when standing up quickly Fast or irregular heartbeat Headaches  For itchy scalp  Start ketoconazole  2 % shampoo - apply two to three times per week, massage into scalp and leave in for 3 to 5 minutes before rinsing out   Seborrheic Keratosis  What causes seborrheic keratoses? Seborrheic keratoses are harmless, common skin growths that first appear during adult life.  As time goes by, more growths appear.  Some people may develop a large number of them.  Seborrheic keratoses appear on both covered and uncovered body parts.  They are not caused by sunlight.  The tendency to develop seborrheic keratoses can be inherited.  They vary in color from skin-colored to gray, brown, or even black.  They can be either smooth or have a rough, warty surface.   Seborrheic keratoses are superficial and look as if they were stuck on the skin.  Under the microscope this type of keratosis looks like layers upon layers of skin.  That is why at times the top layer may seem to fall off, but the rest of the growth remains and re-grows.    Treatment Seborrheic keratoses do not  need to be treated, but can easily be removed in the office.  Seborrheic keratoses often cause symptoms when they rub on clothing or jewelry.  Lesions can be in the way of shaving.  If they become inflamed, they can cause itching, soreness, or burning.  Removal of a seborrheic keratosis can be accomplished by freezing, burning, or surgery. If any spot bleeds, scabs, or grows rapidly, please return to have it checked, as these can be an indication of a skin cancer.  Cryotherapy Aftercare  Wash gently with soap and water  everyday.   Apply Vaseline and Band-Aid daily until healed.     Melanoma ABCDEs  Melanoma is the most dangerous type of skin cancer, and is the leading cause of death from skin disease.  You are more likely to develop melanoma if you: Have light-colored skin, light-colored eyes, or red or blond hair Spend a lot of time in the sun Tan regularly, either outdoors or in a tanning bed Have had blistering sunburns, especially during childhood Have a close family member who has had a melanoma Have atypical moles or large birthmarks  Early detection of melanoma is key since treatment is typically straightforward and cure rates are extremely high if we catch it early.   The first sign of melanoma is often a change in a mole or a new dark spot.  The ABCDE system is a way of remembering the signs of melanoma.  A for asymmetry:  The two  halves do not match. B for border:  The edges of the growth are irregular. C for color:  A mixture of colors are present instead of an even brown color. D for diameter:  Melanomas are usually (but not always) greater than 6mm - the size of a pencil eraser. E for evolution:  The spot keeps changing in size, shape, and color.  Please check your skin once per month between visits. You can use a small mirror in front and a large mirror behind you to keep an eye on the back side or your body.   If you see any new or changing lesions before your next  follow-up, please call to schedule a visit.  Please continue daily skin protection including broad spectrum sunscreen SPF 30+ to sun-exposed areas, reapplying every 2 hours as needed when you're outdoors.   Staying in the shade or wearing long sleeves, sun glasses (UVA+UVB protection) and wide brim hats (4-inch brim around the entire circumference of the hat) are also recommended for sun protection.    Due to recent changes in healthcare laws, you may see results of your pathology and/or laboratory studies on MyChart before the doctors have had a chance to review them. We understand that in some cases there may be results that are confusing or concerning to you. Please understand that not all results are received at the same time and often the doctors may need to interpret multiple results in order to provide you with the best plan of care or course of treatment. Therefore, we ask that you please give us  2 business days to thoroughly review all your results before contacting the office for clarification. Should we see a critical lab result, you will be contacted sooner.   If You Need Anything After Your Visit  If you have any questions or concerns for your doctor, please call our main line at [phone] and press option 4 to reach your doctor's medical assistant. If no one answers, please leave a voicemail as directed and we will return your call as soon as possible. Messages left after 4 pm will be answered the following business day.   You may also send us  a message via MyChart. We typically respond to MyChart messages within 1-2 business days.  For prescription refills, please ask your pharmacy to contact our office. Our fax number is [phone].  If you have an urgent issue when the clinic is closed that cannot wait until the next business day, you can page your doctor at the number below.    Please note that while we do our best to be available for urgent issues outside of office hours,  we are not available 24/7.   If you have an urgent issue and are unable to reach us , you may choose to seek medical care at your doctor's office, retail clinic, urgent care center, or emergency room.  If you have a medical emergency, please immediately call 911 or go to the emergency department.  Pager Numbers  - Dr. Hester: [phone]  - Dr. Jackquline: [phone]  - Dr. Claudene: [phone]   In the event of inclement weather, please call our main line at [phone] for an update on the status of any delays or closures.  Dermatology Medication Tips: Please keep the boxes that topical medications come in in order to help keep track of the instructions about where and how to use these. Pharmacies typically print the medication instructions only on the boxes and not directly on the medication  tubes.   If your medication is too expensive, please contact our office at [phone] option 4 or send us  a message through MyChart.   We are unable to tell what your co-pay for medications will be in advance as this is different depending on your insurance coverage. However, we may be able to find a substitute medication at lower cost or fill out paperwork to get insurance to cover a needed medication.   If a prior authorization is required to get your medication covered by your insurance company, please allow us  1-2 business days to complete this process.  Drug prices often vary depending on where the prescription is filled and some pharmacies may offer cheaper prices.  The website www.goodrx.com contains coupons for medications through different pharmacies. The prices here do not account for what the cost may be with help from insurance (it may be cheaper with your insurance), but the website can give you the price if you did not use any insurance.  - You can print the associated coupon and take it with your prescription to the pharmacy.  - You may also stop by our office during regular  business hours and pick up a GoodRx coupon card.  - If you need your prescription sent electronically to a different pharmacy, notify our office through [facility] or by phone at [phone] option 4.     Si Usted Necesita Algo Despus de Su Visita  Tambin puede enviarnos un mensaje a travs de Clinical cytogeneticist. Por lo general respondemos a los mensajes de MyChart en el transcurso de 1 a 2 das hbiles.  Para renovar recetas, por favor pida a su farmacia que se ponga en contacto con nuestra oficina. Randi lakes de fax es [place] [phone].  Si tiene un asunto urgente cuando la clnica est cerrada y que no puede esperar hasta el siguiente da hbil, puede llamar/localizar a su doctor(a) al nmero que aparece a continuacin.   Por favor, tenga en cuenta que aunque hacemos todo lo posible para estar disponibles para asuntos urgentes fuera del horario de [place], no estamos disponibles las 24 horas del da, los 7 [address]  [address] de la [place].   Si tiene un problema urgente y no puede comunicarse con nosotros, puede optar por buscar atencin mdica  en el consultorio de su doctor(a), en una clnica privada, en un centro de atencin urgente o en una sala de emergencias.  Si tiene Engineer, drilling, por favor llame inmediatamente al 911 o vaya a la sala de emergencias.  Nmeros de bper  - Dr. Hester: [phone]  - Dra. Jackquline: [phone]  - Dr. Claudene: [phone]   En caso de inclemencias del tiempo, por favor llame a landry capes principal al [phone] para una actualizacin sobre el [place] de cualquier retraso o cierre.  Consejos para la medicacin en dermatologa: Por favor, guarde las cajas en las que vienen los medicamentos de uso tpico para ayudarle a seguir las instrucciones sobre dnde y cmo usarlos. Las farmacias generalmente imprimen las instrucciones del medicamento slo en las cajas y no directamente en los tubos del [place].   Si su medicamento es muy caro, por  favor, pngase en contacto con landry rieger llamando al [phone] y presione la opcin 4 o envenos un mensaje a travs de Clinical cytogeneticist.   No podemos decirle cul ser su copago por los medicamentos por adelantado ya que esto es diferente dependiendo de la cobertura de su seguro. Sin embargo, es posible que podamos encontrar un medicamento sustituto a Adult nurse  costo o llenar un formulario para que el seguro cubra el medicamento que se considera necesario.   Si se requiere una autorizacin previa para que su compaa de seguros malta su medicamento, por favor permtanos de 1 a 2 das hbiles para completar este proceso.  Los precios de los medicamentos varan con frecuencia dependiendo del Environmental consultant de dnde se surte la receta y alguna farmacias pueden ofrecer precios ms baratos.  El sitio web www.goodrx.com tiene cupones para medicamentos de Health and safety inspector. Los precios aqu no tienen en cuenta lo que podra costar con la ayuda del seguro (puede ser ms barato con su seguro), pero el sitio web puede darle el precio si no utiliz Tourist information centre manager.  - Puede imprimir el cupn correspondiente y llevarlo con su receta a la farmacia.  - Tambin puede pasar por nuestra oficina durante el horario de atencin regular y Education officer, museum una tarjeta de cupones de GoodRx.  - Si necesita que su receta se enve electrnicamente a una farmacia diferente, informe a nuestra oficina a travs de MyChart de [place] o por telfono llamando al [phone] y presione la opcin 4.

## 2024-03-24 NOTE — Unmapped (Signed)
 [facility] Refill Coordination Note    Specialty Medication(s) to be Shipped:   Infectious Disease: Mycophenolate  (CELLCEPT ) 500 mg tablet     Other medication(s) to be shipped: No additional medications requested for fill at this time    Specialty Medications not needed at this time: N/A     Margaret Berger, DOB: 03-Mar-1961  Phone: There are no phone numbers on file.      All above HIPAA information was verified with patient.     Was a Nurse, learning disability used for this call? No    Completed refill call assessment today to schedule patient's medication shipment from the [facility]  [phone]).  All relevant notes have been reviewed.     Specialty medication(s) and dose(s) confirmed: Regimen is correct and unchanged.   Changes to medications: Kristyl reports no changes at this time.  Changes to insurance: No  New side effects reported not previously addressed with a pharmacist or physician: None reported  Questions for the pharmacist: No    Confirmed patient received a Conservation officer, historic buildings and a Surveyor, mining with first shipment. The patient will receive a drug information handout for each medication shipped and additional FDA Medication Guides as required.       DISEASE/MEDICATION-SPECIFIC INFORMATION        N/A    SPECIALTY MEDICATION ADHERENCE     Medication Adherence    Patient reported X missed doses in the last month: 0  Specialty Medication: mycophenolate  (CELLCEPT ) 500 mg tablet  Patient is on additional specialty medications: No  Informant: patient              Were doses missed due to medication being on hold? No    mycophenolate  (MYFORTIC) 180 MG EC tablet: 12-13 days of medicine on hand       REFERRAL TO PHARMACIST     Referral to the pharmacist: Not needed      [facility]     Shipping address confirmed in Epic.     Cost and Payment: Patient has a copay of $25. They are aware and have authorized the pharmacy to charge the credit card on file.    Delivery Scheduled: Yes, Expected medication delivery date: 9/18.     Medication will be delivered via UPS to the prescription address in Epic WAM.    Jeoffrey JAYSON Sherra UNK Specialty and [facility]

## 2024-03-25 ENCOUNTER — Encounter: Payer: BC Managed Care – PPO | Admitting: Internal Medicine

## 2024-03-30 MED FILL — MYCOPHENOLATE MOFETIL 500 MG TABLET: ORAL | 30 days supply | Qty: 120 | Fill #3

## 2024-04-09 ENCOUNTER — Other Ambulatory Visit: Payer: Self-pay | Admitting: Dermatology

## 2024-05-05 NOTE — Unmapped (Signed)
 [facility] has made a second and final attempt to reach this patient to refill the following medication:mycophenolate  500 mg tablet (CELLCEPT ).      We have left voicemails on the following phone numbers: [phone], have sent a MyChart message, have sent a text message to the following phone numbers: [phone], and have sent a Mychart questionnaire..    Dates contacted: 04/27/2024 05/05/2024  Last scheduled delivery: 03/31/2024    The patient may be at risk of non-compliance with this medication. The patient should call the [facility] at [phone]  Option 4, then Option 4: Infectious Disease, Transplant to refill medication.    Jeoffrey JAYSON Sherra UNK Karel armin Corean Jeppie Venson Karel Nathanael

## 2024-05-06 DIAGNOSIS — K754 Autoimmune hepatitis: Principal | ICD-10-CM

## 2024-05-06 NOTE — Telephone Encounter (Signed)
 Lab orders placed for [facility] (CBC w/diff, CMP, INR), next due 05/2024.    Patient is due for a return appointment with Dr. Brien in February.    Will route to schedulers.

## 2024-05-06 NOTE — Progress Notes (Signed)
 [facility] Specialty and Home Delivery Pharmacy Refill Coordination Note    Specialty Medication(s) to be Shipped:   Infectious Disease: Mycophenolate     Other medication(s) to be shipped: No additional medications requested for fill at this time    Specialty Medications not needed at this time: N/A     Margaret Berger, DOB: March 14, 1961  Phone: There are no phone numbers on file.      All above HIPAA information was verified with patient.     Was a nurse, learning disability used for this call? No    Completed refill call assessment today to schedule patient's medication shipment from the [facility] and [facility] Delivery Pharmacy  [phone]).  All relevant notes have been reviewed.     Specialty medication(s) and dose(s) confirmed: Regimen is correct and unchanged.   Changes to medications: Azariyah reports no changes at this time.  Changes to insurance: No  New side effects reported not previously addressed with a pharmacist or physician: None reported  Questions for the pharmacist: No    Confirmed patient received a Conservation Officer, Historic Buildings and a Surveyor, Mining with first shipment. The patient will receive a drug information handout for each medication shipped and additional FDA Medication Guides as required.       DISEASE/MEDICATION-SPECIFIC INFORMATION        N/A    SPECIALTY MEDICATION ADHERENCE     Medication Adherence    Patient reported X missed doses in the last month: 0  Specialty Medication: mycophenolate  500 mg tablet (CELLCEPT )  Patient is on additional specialty medications: No              Were doses missed due to medication being on hold? No     mycophenolate  500 mg tablet (CELLCEPT ) : 7 days of medicine on hand     REFERRAL TO PHARMACIST     Referral to the pharmacist: Not needed      [facility]     Shipping address confirmed in Epic.     Cost and Payment: Patient has a copay of $25.00. They are aware and have authorized the pharmacy to charge the credit card on file.    Delivery Scheduled: Yes, Expected medication delivery date: 05/10/2024.     Medication will be delivered via Next Day Courier to the prescription address in Epic WAM.     Laymon Duty   [facility] Specialty and Home Delivery Pharmacy  Specialty Technician

## 2024-05-09 MED FILL — MYCOPHENOLATE MOFETIL 500 MG TABLET: ORAL | 30 days supply | Qty: 120 | Fill #4

## 2024-05-11 ENCOUNTER — Other Ambulatory Visit: Payer: Self-pay | Admitting: Internal Medicine

## 2024-05-11 DIAGNOSIS — Z1231 Encounter for screening mammogram for malignant neoplasm of breast: Secondary | ICD-10-CM

## 2024-05-27 ENCOUNTER — Other Ambulatory Visit: Payer: Self-pay | Admitting: Dermatology

## 2024-05-27 DIAGNOSIS — L650 Telogen effluvium: Secondary | ICD-10-CM

## 2024-06-01 NOTE — Progress Notes (Signed)
 [facility] Specialty and Home Delivery Pharmacy Refill Coordination Note    Margaret Berger, DOB: December 23, 1960  Phone: There are no phone numbers on file.      All above HIPAA information was verified with patient.         06/01/2024     9:00 AM   Specialty Rx Medication Refill Questionnaire   Which Medications would you like refilled and shipped? Cellcept . 8 days left   Please list all current allergies: Azathiaprine   Have you missed any doses in the last 30 days? No   Have you had any changes to your medication(s) since your last refill? No   How much of each medication do you have remaining at home? (eg. number of tablets, injections, etc.) 8 days   Have you experienced any side effects in the last 30 days? No   Please enter the full address (street address, city, state, zip code) where you would like your medication(s) to be delivered to. [address], [place], [place] [address]   Please specify on which day you would like your medication(s) to arrive. Note: if you need your medication(s) within 3 days, please call the pharmacy to schedule your order at [phone]  06/08/2024   Has your insurance changed since your last refill? No   Would you like a pharmacist to call you to discuss your medication(s)? No   Do you require a signature for your package? (Note: if we are billing Medicare Part B or your order contains a controlled substance, we will require a signature) No   I have been provided my out of pocket cost for my medication and approve the pharmacy to charge the amount to my credit card on file. Yes         Completed refill call assessment today to schedule patient's medication shipment from the [facility] and Home Delivery Pharmacy [phone]).  All relevant notes have been reviewed.       Confirmed patient received a Conservation Officer, Historic Buildings and a Surveyor, Mining with first shipment. The patient will receive a drug information handout for each medication shipped and additional FDA Medication Guides as required.         REFERRAL TO PHARMACIST     Referral to the pharmacist: Not needed      [facility]     Shipping address confirmed in Epic.     Delivery Scheduled: Yes, Expected medication delivery date: 11/26.     Medication will be delivered via Next Day Courier to the prescription address in Epic WAM.    Margaret Berger Specialty and [facility]

## 2024-06-07 MED FILL — MYCOPHENOLATE MOFETIL 500 MG TABLET: ORAL | 30 days supply | Qty: 120 | Fill #5

## 2024-06-08 ENCOUNTER — Encounter: Payer: Self-pay | Admitting: Internal Medicine

## 2024-06-08 ENCOUNTER — Ambulatory Visit
Admission: RE | Admit: 2024-06-08 | Discharge: 2024-06-08 | Disposition: A | Discharge status: Home / Self Care | Attending: Internal Medicine | Admitting: Internal Medicine

## 2024-06-08 ENCOUNTER — Ambulatory Visit: Admitting: Internal Medicine

## 2024-06-08 ENCOUNTER — Ambulatory Visit
Admission: RE | Admit: 2024-06-08 | Discharge: 2024-06-08 | Disposition: A | Discharge status: Home / Self Care | Source: Ambulatory Visit | Attending: Internal Medicine | Admitting: Internal Medicine

## 2024-06-08 VITALS — BP 114/74 | HR 89 | Ht 66.0 in | Wt 155.8 lb

## 2024-06-08 DIAGNOSIS — E785 Hyperlipidemia, unspecified: Secondary | ICD-10-CM | POA: Diagnosis not present

## 2024-06-08 DIAGNOSIS — K754 Autoimmune hepatitis: Secondary | ICD-10-CM

## 2024-06-08 DIAGNOSIS — R059 Cough, unspecified: Secondary | ICD-10-CM | POA: Insufficient documentation

## 2024-06-08 DIAGNOSIS — R7301 Impaired fasting glucose: Secondary | ICD-10-CM

## 2024-06-08 DIAGNOSIS — R5383 Other fatigue: Secondary | ICD-10-CM

## 2024-06-08 DIAGNOSIS — R058 Other specified cough: Secondary | ICD-10-CM | POA: Diagnosis not present

## 2024-06-08 MED ORDER — PSEUDOEPHEDRINE-CODEINE-GG 30-10-100 MG/5ML PO SOLN: 10.0000 mL | Freq: Four times a day (QID) | ORAL | 0 refills | Status: AC | PRN

## 2024-06-08 MED ORDER — PREDNISONE 10 MG PO TABS: ORAL_TABLET | ORAL | 0 refills | Status: AC

## 2024-06-08 NOTE — Assessment & Plan Note (Addendum)
 Managed with Cellcept by [facility] .  Liver MRI and AFP done semi annually as well ,  last one August 2025

## 2024-06-08 NOTE — Patient Instructions (Addendum)
  You are DUE for your tetanus-diptheria-pertussis vaccine   (TDaP)   Please get this done at your pharmacy  AFTER YOUR COUGH HAS RESOLVED ;  it will be PAID FOR MY MEDICARE ONLY AT YOUR PHARMACY   CHEST X RAY TODAY :  NO ANTIBIOTICS UNLESS THERE IS AN INFILTRATE.   I WILL SEND A COUGH SUPPRESSANT TO USE AT NIGHT TO YOUR PHARMACY.  USE DELSYM DURING THE DAY.

## 2024-06-08 NOTE — Progress Notes (Signed)
 Patient ID: Jessica Zuniga, female    DOB: Apr 26, 1961  Age: 63 y.o. MRN: [id number]  The patient is here for annual preventive examination and management of other chronic and acute problems.   The risk factors are reflected in the social history.   The roster of all physicians providing medical care to patient - is listed in the Snapshot section of the chart.   Activities of daily living:  The patient is 100% independent in all ADLs: dressing, toileting, feeding as well as independent mobility   Home safety : The patient has smoke detectors in the home. They wear seatbelts.  There are no unsecured firearms at home. There is no violence in the home.    There is no risks for hepatitis, STDs or HIV. There is no   history of blood transfusion. They have no travel history to infectious disease endemic areas of the world.   The patient has seen their dentist in the last six month. They have seen their eye doctor in the last year. The patinet  denies slight hearing difficulty with regard to whispered voices and some television programs.  They have deferred audiologic testing in the last year.  They do not  have excessive sun exposure. Discussed the need for sun protection: hats, long sleeves and use of sunscreen if there is significant sun exposure.    Diet: the importance of a healthy diet is discussed. They do have a healthy diet.   The benefits of regular aerobic exercise were discussed. The patient  exercises  3 to 5 days per week  for  60 minutes.    Depression screen: there are no signs or vegative symptoms of depression- irritability, change in appetite, anhedonia, sadness/tearfullness.   The following portions of the patient's history were reviewed and updated as appropriate: allergies, current medications, past family history, past medical history,  past surgical history, past social history  and problem list.   Visual acuity was not assessed per patient preference since the patient has  regular follow up with an  ophthalmologist. Hearing and body mass index were assessed and reviewed.    During the course of the visit the patient was educated and counseled about appropriate screening and preventive services including : fall prevention , diabetes screening, nutrition counseling, colorectal cancer screening, and recommended immunizations.    Chief Complaint:  orlena started 5 weeks after flying  ,  body aches.  Productive cough, treated with 2 weeks of Augmentin ,  finished 10 days ago      Review of Symptoms  Patient denies headache, fevers, malaise, unintentional weight loss, skin rash, eye pain, sinus congestion and sinus pain, sore throat, dysphagia,  hemoptysis , cough, dyspnea, wheezing, chest pain, palpitations, orthopnea, edema, abdominal pain, nausea, melena, diarrhea, constipation, flank pain, dysuria, hematuria, urinary  Frequency, nocturia, numbness, tingling, seizures,  Focal weakness, Loss of consciousness,  Tremor, insomnia, depression, anxiety, and suicidal ideation.    Physical Exam:  BP 114/74   Pulse 89   Ht 5' 6 (1.676 m)   Wt 155 lb 12.8 oz (70.7 kg)   SpO2 97%   BMI 25.15 kg/m    Physical Exam Vitals reviewed.  Constitutional:      General: She is not in acute distress.    Appearance: Normal appearance. She is normal weight. She is not ill-appearing, toxic-appearing or diaphoretic.  HENT:     Head: Normocephalic.  Eyes:     General: No scleral icterus.  Right eye: No discharge.        Left eye: No discharge.     Conjunctiva/sclera: Conjunctivae normal.  Cardiovascular:     Rate and Rhythm: Normal rate and regular rhythm.     Heart sounds: Normal heart sounds.  Pulmonary:     Effort: Pulmonary effort is normal. No respiratory distress.     Breath sounds: Normal breath sounds.  Musculoskeletal:        General: Normal range of motion.  Skin:    General: Skin is warm and dry.  Neurological:     General: No focal deficit present.      Mental Status: She is alert and oriented to person, place, and time. Mental status is at baseline.  Psychiatric:        Mood and Affect: Mood normal.        Behavior: Behavior normal.        Thought Content: Thought content normal.        Judgment: Judgment normal.    Assessment and Plan: Hyperlipidemia LDL goal <130  Other fatigue  Impaired fasting glucose  Autoimmune hepatitis (HCC) Assessment & Plan: Managed with Cellcept by [facility] .  Liver MRI and AFP done semi annually as well ,  last one August 2025      No follow-ups on file.  Verneita LITTIE Kettering, MD

## 2024-06-10 ENCOUNTER — Encounter: Payer: Self-pay | Admitting: Internal Medicine

## 2024-06-10 NOTE — Assessment & Plan Note (Addendum)
 10 yr risk for CAD was not elevated . And coronary calcium score was zero. No treatment adised   Lab Results  Component Value Date   CHOL 244 (H) 04/17/2023   HDL 100 04/17/2023   LDLCALC 128 (H) 04/17/2023   LDLDIRECT 135 (H) 04/17/2023   TRIG 93 04/17/2023   CHOLHDL 2.4 04/17/2023     Lab Results  Component Value Date   CHOL 244 (H) 04/17/2023   HDL 100 04/17/2023   LDLCALC 128 (H) 04/17/2023   LDLDIRECT 135 (H) 04/17/2023   TRIG 93 04/17/2023   CHOLHDL 2.4 04/17/2023

## 2024-06-13 ENCOUNTER — Ambulatory Visit: Admitting: Dermatology

## 2024-06-14 LAB — CBC W/ DIFFERENTIAL
BANDED NEUTROPHILS ABSOLUTE COUNT: 0 x10E3/uL (ref 0.0–0.1)
BASOPHILS ABSOLUTE COUNT: 0 x10E3/uL (ref 0.0–0.2)
BASOPHILS RELATIVE PERCENT: 0 %
EOSINOPHILS ABSOLUTE COUNT: 0.1 x10E3/uL (ref 0.0–0.4)
EOSINOPHILS RELATIVE PERCENT: 2 %
HEMATOCRIT: 40.9 % (ref 34.0–46.6)
HEMOGLOBIN: 13.4 g/dL (ref 11.1–15.9)
IMMATURE GRANULOCYTES: 0 %
LYMPHOCYTES ABSOLUTE COUNT: 1.6 x10E3/uL (ref 0.7–3.1)
LYMPHOCYTES RELATIVE PERCENT: 27 %
MEAN CORPUSCULAR HEMOGLOBIN CONC: 32.8 g/dL (ref 31.5–35.7)
MEAN CORPUSCULAR HEMOGLOBIN: 31.8 pg (ref 26.6–33.0)
MEAN CORPUSCULAR VOLUME: 97 fL (ref 79–97)
MONOCYTES ABSOLUTE COUNT: 0.4 x10E3/uL (ref 0.1–0.9)
MONOCYTES RELATIVE PERCENT: 6 %
NEUTROPHILS ABSOLUTE COUNT: 3.8 x10E3/uL (ref 1.4–7.0)
NEUTROPHILS RELATIVE PERCENT: 65 %
PLATELET COUNT: 191 x10E3/uL (ref 150–450)
RED BLOOD CELL COUNT: 4.22 x10E6/uL (ref 3.77–5.28)
RED CELL DISTRIBUTION WIDTH: 12.7 % (ref 11.7–15.4)
WHITE BLOOD CELL COUNT: 5.9 x10E3/uL (ref 3.4–10.8)

## 2024-06-14 LAB — COMPREHENSIVE METABOLIC PANEL
ALBUMIN: 4.8 g/dL (ref 3.9–4.9)
ALKALINE PHOSPHATASE: 57 IU/L (ref 49–135)
ALT (SGPT): 16 IU/L (ref 0–32)
AST (SGOT): 20 IU/L (ref 0–40)
BILIRUBIN TOTAL (MG/DL) IN SER/PLAS: 0.3 mg/dL (ref 0.0–1.2)
BLOOD UREA NITROGEN: 16 mg/dL (ref 8–27)
BUN / CREAT RATIO: 25 (ref 12–28)
CALCIUM: 9.7 mg/dL (ref 8.7–10.3)
CHLORIDE: 100 mmol/L (ref 96–106)
CO2: 22 mmol/L (ref 20–29)
CREATININE: 0.64 mg/dL (ref 0.57–1.00)
EGFR: 98 mL/min/1.73
GLOBULIN, TOTAL: 1.8 g/dL (ref 1.5–4.5)
GLUCOSE: 100 mg/dL — ABNORMAL HIGH (ref 70–99)
POTASSIUM: 4.5 mmol/L (ref 3.5–5.2)
SODIUM: 137 mmol/L (ref 134–144)
TOTAL PROTEIN: 6.6 g/dL (ref 6.0–8.5)

## 2024-06-14 LAB — PROTIME-INR
INR: 0.9 (ref 0.9–1.2)
PROTHROMBIN TIME: 10.3 s (ref 9.1–12.0)

## 2024-06-14 LAB — HEMOGLOBIN A1C
Est. average glucose Bld gHb Est-mCnc: 111 mg/dL
Hgb A1c MFr Bld: 5.5 % (ref 4.8–5.6)

## 2024-06-14 LAB — LDL CHOLESTEROL, DIRECT: LDL Direct: 133 mg/dL — ABNORMAL HIGH (ref 0–99)

## 2024-06-14 LAB — LIPID PANEL
Chol/HDL Ratio: 2.7 ratio (ref 0.0–4.4)
Cholesterol, Total: 235 mg/dL — ABNORMAL HIGH (ref 100–199)
HDL: 87 mg/dL (ref >39)
LDL Chol Calc (NIH): 124 mg/dL — ABNORMAL HIGH (ref 0–99)
Triglycerides: 138 mg/dL (ref 0–149)
VLDL Cholesterol Cal: 24 mg/dL (ref 5–40)

## 2024-06-14 LAB — TSH: TSH: 1.13 u[IU]/mL (ref 0.450–4.500)

## 2024-06-15 ENCOUNTER — Ambulatory Visit: Payer: Self-pay | Admitting: Internal Medicine

## 2024-06-15 ENCOUNTER — Encounter: Payer: Self-pay | Admitting: Internal Medicine

## 2024-06-15 DIAGNOSIS — R932 Abnormal findings on diagnostic imaging of liver and biliary tract: Secondary | ICD-10-CM | POA: Insufficient documentation

## 2024-07-01 ENCOUNTER — Ambulatory Visit
Admission: RE | Admit: 2024-07-01 | Discharge: 2024-07-01 | Disposition: A | Discharge status: Home / Self Care | Source: Ambulatory Visit | Attending: Internal Medicine | Admitting: Internal Medicine

## 2024-07-01 DIAGNOSIS — Z1231 Encounter for screening mammogram for malignant neoplasm of breast: Secondary | ICD-10-CM | POA: Diagnosis present

## 2024-07-01 DIAGNOSIS — K754 Autoimmune hepatitis: Principal | ICD-10-CM

## 2024-07-01 MED ORDER — MYCOPHENOLATE MOFETIL 500 MG TABLET
ORAL_TABLET | Freq: Two times a day (BID) | ORAL | 1 refills | 90.00000 days
Start: 2024-07-01 — End: ?

## 2024-07-04 ENCOUNTER — Ambulatory Visit: Admitting: Dermatology

## 2024-07-04 DIAGNOSIS — L820 Inflamed seborrheic keratosis: Secondary | ICD-10-CM | POA: Diagnosis not present

## 2024-07-04 DIAGNOSIS — L821 Other seborrheic keratosis: Secondary | ICD-10-CM

## 2024-07-04 DIAGNOSIS — L814 Other melanin hyperpigmentation: Secondary | ICD-10-CM

## 2024-07-04 DIAGNOSIS — L578 Other skin changes due to chronic exposure to nonionizing radiation: Secondary | ICD-10-CM | POA: Diagnosis not present

## 2024-07-04 DIAGNOSIS — D229 Melanocytic nevi, unspecified: Secondary | ICD-10-CM

## 2024-07-04 DIAGNOSIS — W908XXA Exposure to other nonionizing radiation, initial encounter: Secondary | ICD-10-CM | POA: Diagnosis not present

## 2024-07-04 MED ORDER — MYCOPHENOLATE MOFETIL 500 MG TABLET
ORAL_TABLET | Freq: Two times a day (BID) | ORAL | 1 refills | 90.00000 days | Status: CP
Start: 2024-07-04 — End: ?
  Filled 2024-07-08: qty 120, 30d supply, fill #0

## 2024-07-04 NOTE — Patient Instructions (Signed)

## 2024-07-04 NOTE — Progress Notes (Unsigned)
" ° °  Follow-Up Visit   Subjective  Jessica Zuniga is a 63 y.o. female who presents for the following: Irritated skin lesion on the chest that was irritated and inflamed, and recently sloughed off. Pt concerned and would like checked today. The patient has spots, moles and lesions to be evaluated, some may be new or changing and the patient may have concern these could be cancer.  The patient has spots, moles and lesions to be evaluated, some may be new or changing and the patient may have concern these could be cancer.  The following portions of the chart were reviewed this encounter and updated as appropriate: medications, allergies, medical history  Review of Systems:  No other skin or systemic complaints except as noted in HPI or Assessment and Plan.  Objective  Well appearing patient in no apparent distress; mood and affect are within normal limits.   A focused examination was performed of the following areas: the face, back, arms, hands, and chest.   Relevant exam findings are noted in the Assessment and Plan.  intermammary x 7, R face x 2, R chest x 2, L chest x 2, back x 11, arms x 4 (28) Erythematous stuck-on, waxy papule or plaque  Assessment & Plan   ACTINIC DAMAGE - chronic, secondary to cumulative UV radiation exposure/sun exposure over time - diffuse scaly erythematous macules with underlying dyspigmentation - Recommend daily broad spectrum sunscreen SPF 30+ to sun-exposed areas, reapply every 2 hours as needed.  - Recommend staying in the shade or wearing long sleeves, sun glasses (UVA+UVB protection) and wide brim hats (4-inch brim around the entire circumference of the hat). - Call for new or changing lesions.  SEBORRHEIC KERATOSIS - Stuck-on, waxy, tan-brown papules and/or plaques  - Benign-appearing - Discussed benign etiology and prognosis. - Observe - Call for any changes  LENTIGINES Exam: scattered tan macules Due to sun exposure Treatment  Plan: Benign-appearing, observe. Recommend daily broad spectrum sunscreen SPF 30+ to sun-exposed areas, reapply every 2 hours as needed.  Call for any changes  MELANOCYTIC NEVI Exam: Tan-brown and/or pink-flesh-colored symmetric macules and papules Treatment Plan: Benign appearing on exam today. Recommend observation. Call clinic for new or changing moles. Recommend daily use of broad spectrum spf 30+ sunscreen to sun-exposed areas.  INFLAMED SEBORRHEIC KERATOSIS (28) intermammary x 7, R face x 2, R chest x 2, L chest x 2, back x 11, arms x 4 (28) Symptomatic, irritating, patient would like treated. - Destruction of lesion - intermammary x 7, R face x 2, R chest x 2, L chest x 2, back x 11, arms x 4 (28) Complexity: simple   Destruction method: cryotherapy   Informed consent: discussed and consent obtained   Timeout:  patient name, date of birth, surgical site, and procedure verified Lesion destroyed using liquid nitrogen: Yes   Region frozen until ice ball extended beyond lesion: Yes   Outcome: patient tolerated procedure well with no complications   Post-procedure details: wound care instructions given     Return for appointment as scheduled.  Jessica Zuniga, CMA, am acting as scribe for Alm Rhyme, MD .   Documentation: I have reviewed the above documentation for accuracy and completeness, and I agree with the above.  Alm Rhyme, MD    "

## 2024-07-04 NOTE — Telephone Encounter (Signed)
 Received med refill request for Cellcept . Authorized per protocol.    Last ov:  02/17/24  Next visit:  08/24/24    Last lab draw:  06/13/24

## 2024-07-05 ENCOUNTER — Encounter: Payer: Self-pay | Admitting: Dermatology

## 2024-07-05 NOTE — Progress Notes (Signed)
 [facility] Specialty and Home Delivery Pharmacy Refill Coordination Note    Specialty Medication(s) to be Shipped:   Infectious Disease: Mycophenolate     Other medication(s) to be shipped: No additional medications requested for fill at this time    Specialty Medications not needed at this time: N/A     Margaret Berger, DOB: 07-12-1961  Phone: There are no phone numbers on file.      All above HIPAA information was verified with patient.     Was a nurse, learning disability used for this call? No    Completed refill call assessment today to schedule patient's medication shipment from the [facility]  [phone]).  All relevant notes have been reviewed.     Specialty medication(s) and dose(s) confirmed: Regimen is correct and unchanged.   Changes to medications: Aseneth reports no changes at this time.  Changes to insurance: No  New side effects reported not previously addressed with a pharmacist or physician: None reported  Questions for the pharmacist: No    Confirmed patient received a Conservation Officer, Historic Buildings and a Surveyor, Mining with first shipment. The patient will receive a drug information handout for each medication shipped and additional FDA Medication Guides as required.       DISEASE/MEDICATION-SPECIFIC INFORMATION        N/A    SPECIALTY MEDICATION ADHERENCE     Medication Adherence    Specialty Medication: mycophenolate  500 mg tablet (CELLCEPT )              Were doses missed due to medication being on hold? No     mycophenolate  500 mg tablet (CELLCEPT ): 7-8 days of medicine on hand       Specialty medication is an injection or given on a cycle: No    REFERRAL TO PHARMACIST     Referral to the pharmacist: Not needed      [facility]     Shipping address confirmed in Epic.     Cost and Payment: Patient has a copay of $25. They are aware and have authorized the pharmacy to charge the credit card on file.    Delivery Scheduled: Yes, Expected medication delivery date: 07/09/24.     Medication will be delivered via Next Day Courier to the prescription address in Epic WAM.    Margaret Berger   [facility] Specialty and Home Delivery Pharmacy  Specialty Technician

## 2024-07-05 NOTE — Progress Notes (Signed)
 The [facility] has made a second and final attempt to reach this patient to refill the following medication:mycophenolate  500 mg tablet (CELLCEPT ).      We have left voicemails on the following phone numbers: [phone], have sent a MyChart message, have sent a text message to the following phone numbers: [phone], and have sent a Mychart questionnaire..    Dates contacted: 07/01/2024  07/05/2024  Last scheduled delivery: 06/08/2024    The patient may be at risk of non-compliance with this medication. The patient should call the [facility] at [phone]  Option 4, then Option 4: Infectious Disease, Transplant to refill medication.    Jeoffrey JAYSON Sherra UNK Karel armin Corean Jeppie Venson Karel Nathanael

## 2024-07-10 ENCOUNTER — Ambulatory Visit: Payer: Self-pay | Admitting: Internal Medicine

## 2024-08-05 NOTE — Progress Notes (Signed)
 [facility] Specialty and Home Delivery Pharmacy Refill Coordination Note    Margaret Berger, DOB: 03-08-1961  Phone: There are no phone numbers on file.      All above HIPAA information was verified with patient.         07/28/2024     2:17 PM   Specialty Rx Medication Refill Questionnaire   Which Medications would you like refilled and shipped? Cellcept    Please list all current allergies: Azathioprine   Have you missed any doses in the last 30 days? No   Have you had any changes to your medication(s) since your last refill? No   How much of each medication do you have remaining at home? (eg. number of tablets, injections, etc.) 70 tablets   Have you experienced any side effects in the last 30 days? No   Please enter the full address (street address, city, state, zip code) where you would like your medication(s) to be delivered to. [address], [place], [place] [address]   Please specify on which day you would like your medication(s) to arrive. Note: if you need your medication(s) within 3 days, please call the pharmacy to schedule your order at [phone]  08/09/2024   Has your insurance changed since your last refill? No   Would you like a pharmacist to call you to discuss your medication(s)? No   Do you require a signature for your package? (Note: if we are billing Medicare Part B or your order contains a controlled substance, we will require a signature) No   I have been provided my out of pocket cost for my medication and approve the pharmacy to charge the amount to my credit card on file. Yes         Completed refill call assessment today to schedule patient's medication shipment from the [facility] and Home Delivery Pharmacy [phone]).  All relevant notes have been reviewed.       Confirmed patient received a Conservation Officer, Historic Buildings and a Surveyor, Mining with first shipment. The patient will receive a drug information handout for each medication shipped and additional FDA Medication Guides as required. REFERRAL TO PHARMACIST     Referral to the pharmacist: Not needed      [facility]     Shipping address confirmed in Epic.     Delivery Scheduled: Yes, Expected medication delivery date: 1/27.     Medication will be delivered via Next Day Courier to the prescription address in Epic WAM.    Jeoffrey JAYSON Sherra UNK [facility]

## 2024-08-09 MED FILL — MYCOPHENOLATE MOFETIL 500 MG TABLET: ORAL | 30 days supply | Qty: 120 | Fill #1

## 2024-08-15 ENCOUNTER — Ambulatory Visit: Admitting: Dermatology

## 2025-03-06 ENCOUNTER — Encounter: Admitting: Dermatology

## 2025-07-05 ENCOUNTER — Encounter: Admitting: Internal Medicine
